# Patient Record
Sex: Female | Born: 1940
Health system: Southern US, Community
[De-identification: ages and names within clinical notes are randomized; demographics above are authoritative.]

## PROBLEM LIST (undated history)

## (undated) DIAGNOSIS — I509 Heart failure, unspecified: Secondary | ICD-10-CM

## (undated) DIAGNOSIS — I517 Cardiomegaly: Secondary | ICD-10-CM

## (undated) DIAGNOSIS — E785 Hyperlipidemia, unspecified: Secondary | ICD-10-CM

## (undated) DIAGNOSIS — D72821 Monocytosis (symptomatic): Secondary | ICD-10-CM

## (undated) DIAGNOSIS — R079 Chest pain, unspecified: Secondary | ICD-10-CM

## (undated) DIAGNOSIS — K219 Gastro-esophageal reflux disease without esophagitis: Secondary | ICD-10-CM

## (undated) DIAGNOSIS — I10 Essential (primary) hypertension: Secondary | ICD-10-CM

## (undated) DIAGNOSIS — E039 Hypothyroidism, unspecified: Secondary | ICD-10-CM

## (undated) DIAGNOSIS — I639 Cerebral infarction, unspecified: Secondary | ICD-10-CM

## (undated) HISTORY — DX: Monocytosis (symptomatic): D72.821

## (undated) HISTORY — PX: MASS EXCISION: SHX2000

## (undated) HISTORY — PX: ABDOMINAL HYSTERECTOMY: SHX81

---

## 2001-10-15 ENCOUNTER — Encounter: Admission: RE | Admit: 2001-10-15 | Discharge: 2001-10-15 | Payer: Self-pay | Admitting: Internal Medicine

## 2001-10-15 ENCOUNTER — Encounter: Payer: Self-pay | Admitting: Internal Medicine

## 2001-11-13 ENCOUNTER — Encounter: Admission: RE | Admit: 2001-11-13 | Discharge: 2001-11-13 | Payer: Self-pay | Admitting: Internal Medicine

## 2001-11-13 ENCOUNTER — Encounter: Payer: Self-pay | Admitting: Internal Medicine

## 2002-10-17 ENCOUNTER — Encounter: Admission: RE | Admit: 2002-10-17 | Discharge: 2002-10-17 | Payer: Self-pay | Admitting: Internal Medicine

## 2002-10-17 ENCOUNTER — Encounter: Payer: Self-pay | Admitting: Internal Medicine

## 2003-01-27 ENCOUNTER — Encounter: Payer: Self-pay | Admitting: Internal Medicine

## 2003-01-27 ENCOUNTER — Encounter: Admission: RE | Admit: 2003-01-27 | Discharge: 2003-01-27 | Payer: Self-pay | Admitting: Internal Medicine

## 2003-12-09 ENCOUNTER — Encounter: Admission: RE | Admit: 2003-12-09 | Discharge: 2003-12-09 | Payer: Self-pay | Admitting: Internal Medicine

## 2004-01-20 ENCOUNTER — Ambulatory Visit (HOSPITAL_COMMUNITY): Admission: RE | Admit: 2004-01-20 | Discharge: 2004-01-20 | Payer: Self-pay | Admitting: Internal Medicine

## 2004-05-16 ENCOUNTER — Encounter: Admission: RE | Admit: 2004-05-16 | Discharge: 2004-05-16 | Payer: Self-pay | Admitting: Internal Medicine

## 2004-06-29 ENCOUNTER — Encounter (INDEPENDENT_AMBULATORY_CARE_PROVIDER_SITE_OTHER): Payer: Self-pay | Admitting: Specialist

## 2004-06-29 ENCOUNTER — Ambulatory Visit (HOSPITAL_COMMUNITY): Admission: RE | Admit: 2004-06-29 | Discharge: 2004-06-29 | Payer: Self-pay | Admitting: Endocrinology

## 2004-12-05 ENCOUNTER — Ambulatory Visit (HOSPITAL_COMMUNITY): Admission: RE | Admit: 2004-12-05 | Discharge: 2004-12-05 | Payer: Self-pay | Admitting: Endocrinology

## 2004-12-12 ENCOUNTER — Encounter: Admission: RE | Admit: 2004-12-12 | Discharge: 2004-12-12 | Payer: Self-pay | Admitting: Internal Medicine

## 2005-02-07 ENCOUNTER — Ambulatory Visit (HOSPITAL_COMMUNITY): Admission: RE | Admit: 2005-02-07 | Discharge: 2005-02-07 | Payer: Self-pay | Admitting: Internal Medicine

## 2005-06-07 ENCOUNTER — Encounter: Admission: RE | Admit: 2005-06-07 | Discharge: 2005-07-06 | Payer: Self-pay | Admitting: Internal Medicine

## 2006-02-14 ENCOUNTER — Ambulatory Visit (HOSPITAL_COMMUNITY): Admission: RE | Admit: 2006-02-14 | Discharge: 2006-02-14 | Payer: Self-pay | Admitting: Internal Medicine

## 2006-04-11 ENCOUNTER — Encounter: Admission: RE | Admit: 2006-04-11 | Discharge: 2006-04-11 | Payer: Self-pay | Admitting: Internal Medicine

## 2007-02-18 ENCOUNTER — Encounter: Admission: RE | Admit: 2007-02-18 | Discharge: 2007-02-18 | Payer: Self-pay | Admitting: Orthopedic Surgery

## 2007-02-19 ENCOUNTER — Ambulatory Visit (HOSPITAL_COMMUNITY): Admission: RE | Admit: 2007-02-19 | Discharge: 2007-02-19 | Payer: Self-pay | Admitting: *Deleted

## 2007-05-02 ENCOUNTER — Encounter: Admission: RE | Admit: 2007-05-02 | Discharge: 2007-05-02 | Payer: Self-pay | Admitting: Endocrinology

## 2008-01-07 ENCOUNTER — Encounter: Admission: RE | Admit: 2008-01-07 | Discharge: 2008-01-07 | Payer: Self-pay | Admitting: Family Medicine

## 2008-02-21 ENCOUNTER — Ambulatory Visit (HOSPITAL_COMMUNITY): Admission: RE | Admit: 2008-02-21 | Discharge: 2008-02-21 | Payer: Self-pay | Admitting: Family Medicine

## 2009-01-04 ENCOUNTER — Encounter: Admission: RE | Admit: 2009-01-04 | Discharge: 2009-01-04 | Payer: Self-pay | Admitting: Family Medicine

## 2009-02-22 ENCOUNTER — Ambulatory Visit (HOSPITAL_COMMUNITY): Admission: RE | Admit: 2009-02-22 | Discharge: 2009-02-22 | Payer: Self-pay | Admitting: Family Medicine

## 2009-04-26 ENCOUNTER — Observation Stay (HOSPITAL_COMMUNITY): Admission: EM | Admit: 2009-04-26 | Discharge: 2009-04-27 | Payer: Self-pay | Admitting: Emergency Medicine

## 2009-04-27 ENCOUNTER — Encounter (INDEPENDENT_AMBULATORY_CARE_PROVIDER_SITE_OTHER): Payer: Self-pay | Admitting: Cardiovascular Disease

## 2010-02-23 ENCOUNTER — Ambulatory Visit (HOSPITAL_COMMUNITY): Admission: RE | Admit: 2010-02-23 | Discharge: 2010-02-23 | Payer: Self-pay | Admitting: *Deleted

## 2010-07-17 ENCOUNTER — Encounter: Payer: Self-pay | Admitting: Internal Medicine

## 2010-09-28 LAB — POCT I-STAT, CHEM 8
BUN: 11 mg/dL (ref 6–23)
Calcium, Ion: 1.26 mmol/L (ref 1.12–1.32)
Creatinine, Ser: 0.7 mg/dL (ref 0.4–1.2)
Glucose, Bld: 125 mg/dL — ABNORMAL HIGH (ref 70–99)
Hemoglobin: 16.3 g/dL — ABNORMAL HIGH (ref 12.0–15.0)
Sodium: 136 mEq/L (ref 135–145)

## 2010-09-28 LAB — CARDIAC PANEL(CRET KIN+CKTOT+MB+TROPI)
CK, MB: 1 ng/mL (ref 0.3–4.0)
Relative Index: INVALID (ref 0.0–2.5)
Total CK: 50 U/L (ref 7–177)
Troponin I: 0.03 ng/mL (ref 0.00–0.06)
Troponin I: 0.03 ng/mL (ref 0.00–0.06)

## 2010-09-28 LAB — CBC
HCT: 38.2 % (ref 36.0–46.0)
HCT: 44.4 % (ref 36.0–46.0)
MCHC: 32.8 g/dL (ref 30.0–36.0)
MCHC: 33 g/dL (ref 30.0–36.0)
MCV: 81.8 fL (ref 78.0–100.0)
MCV: 82 fL (ref 78.0–100.0)
Platelets: 239 10*3/uL (ref 150–400)
RDW: 14 % (ref 11.5–15.5)
WBC: 10.9 10*3/uL — ABNORMAL HIGH (ref 4.0–10.5)

## 2010-09-28 LAB — COMPREHENSIVE METABOLIC PANEL
ALT: <8 U/L (ref 0–35)
AST: 15 U/L (ref 0–37)
Albumin: 3.6 g/dL (ref 3.5–5.2)
Alkaline Phosphatase: 43 U/L (ref 39–117)
CO2: 27 mEq/L (ref 19–32)
Calcium: 9.5 mg/dL (ref 8.4–10.5)
Chloride: 104 mEq/L (ref 96–112)
GFR calc non Af Amer: >60 mL/min (ref >60)
Glucose, Bld: 114 mg/dL — ABNORMAL HIGH (ref 70–99)
Potassium: 3.8 mEq/L (ref 3.5–5.1)
Sodium: 137 mEq/L (ref 135–145)
Total Bilirubin: 0.7 mg/dL (ref 0.3–1.2)
Total Protein: 7.1 g/dL (ref 6.0–8.3)

## 2010-09-28 LAB — BASIC METABOLIC PANEL
BUN: 8 mg/dL (ref 6–23)
Calcium: 9.1 mg/dL (ref 8.4–10.5)
Chloride: 108 mEq/L (ref 96–112)
Creatinine, Ser: 0.58 mg/dL (ref 0.4–1.2)
GFR calc Af Amer: >60 mL/min (ref >60)
GFR calc non Af Amer: >60 mL/min (ref >60)

## 2010-09-28 LAB — TSH: TSH: 0.777 u[IU]/mL (ref 0.350–4.500)

## 2010-09-28 LAB — URINALYSIS, ROUTINE W REFLEX MICROSCOPIC
Glucose, UA: NEGATIVE mg/dL
Nitrite: NEGATIVE
Urobilinogen, UA: 0.2 mg/dL (ref 0.0–1.0)
pH: 7 (ref 5.0–8.0)

## 2010-09-28 LAB — POCT CARDIAC MARKERS: CKMB, poc: 1.5 ng/mL (ref 1.0–8.0)

## 2010-09-28 LAB — DIFFERENTIAL
Basophils Relative: 0 % (ref 0–1)
Eosinophils Absolute: 0.1 10*3/uL (ref 0.0–0.7)
Eosinophils Relative: 1 % (ref 0–5)
Monocytes Absolute: 1.9 10*3/uL — ABNORMAL HIGH (ref 0.1–1.0)
Monocytes Relative: 17 % — ABNORMAL HIGH (ref 3–12)
Neutro Abs: 7.5 10*3/uL (ref 1.7–7.7)

## 2010-09-28 LAB — CK TOTAL AND CKMB (NOT AT ARMC): Relative Index: INVALID (ref 0.0–2.5)

## 2010-09-28 LAB — LIPID PANEL
Cholesterol: 173 mg/dL (ref 0–200)
HDL: 78 mg/dL (ref >39)

## 2010-09-28 LAB — GLUCOSE, CAPILLARY: Glucose-Capillary: 84 mg/dL (ref 70–99)

## 2010-09-28 LAB — TROPONIN I: Troponin I: 0.01 ng/mL (ref 0.00–0.06)

## 2010-09-28 LAB — PROTIME-INR: Prothrombin Time: 14.7 seconds (ref 11.6–15.2)

## 2010-09-28 LAB — HEMOGLOBIN A1C: Hgb A1c MFr Bld: 5.8 % (ref 4.6–6.1)

## 2011-02-02 ENCOUNTER — Other Ambulatory Visit (HOSPITAL_COMMUNITY): Payer: Self-pay | Admitting: Family Medicine

## 2011-02-02 DIAGNOSIS — Z1231 Encounter for screening mammogram for malignant neoplasm of breast: Secondary | ICD-10-CM

## 2011-02-28 ENCOUNTER — Ambulatory Visit (HOSPITAL_COMMUNITY)
Admission: RE | Admit: 2011-02-28 | Discharge: 2011-02-28 | Disposition: A | Discharge status: Home / Self Care | Payer: Medicare Other | Source: Ambulatory Visit | Attending: Family Medicine | Admitting: Family Medicine

## 2011-02-28 DIAGNOSIS — Z1231 Encounter for screening mammogram for malignant neoplasm of breast: Secondary | ICD-10-CM | POA: Insufficient documentation

## 2011-07-24 DIAGNOSIS — H26499 Other secondary cataract, unspecified eye: Secondary | ICD-10-CM | POA: Diagnosis not present

## 2011-08-28 DIAGNOSIS — H26499 Other secondary cataract, unspecified eye: Secondary | ICD-10-CM | POA: Diagnosis not present

## 2011-10-16 ENCOUNTER — Other Ambulatory Visit: Payer: Self-pay | Admitting: Family Medicine

## 2011-10-16 ENCOUNTER — Ambulatory Visit
Admission: RE | Admit: 2011-10-16 | Discharge: 2011-10-16 | Disposition: A | Discharge status: Home / Self Care | Payer: Medicare Other | Source: Ambulatory Visit | Attending: Family Medicine | Admitting: Family Medicine

## 2011-10-16 DIAGNOSIS — R05 Cough: Secondary | ICD-10-CM

## 2011-10-16 DIAGNOSIS — E782 Mixed hyperlipidemia: Secondary | ICD-10-CM | POA: Diagnosis not present

## 2011-10-16 DIAGNOSIS — E049 Nontoxic goiter, unspecified: Secondary | ICD-10-CM | POA: Diagnosis not present

## 2011-10-16 DIAGNOSIS — E559 Vitamin D deficiency, unspecified: Secondary | ICD-10-CM | POA: Diagnosis not present

## 2011-10-16 DIAGNOSIS — R079 Chest pain, unspecified: Secondary | ICD-10-CM | POA: Diagnosis not present

## 2011-10-16 DIAGNOSIS — R42 Dizziness and giddiness: Secondary | ICD-10-CM | POA: Diagnosis not present

## 2011-10-16 DIAGNOSIS — K219 Gastro-esophageal reflux disease without esophagitis: Secondary | ICD-10-CM | POA: Diagnosis not present

## 2011-10-16 DIAGNOSIS — I517 Cardiomegaly: Secondary | ICD-10-CM | POA: Diagnosis not present

## 2011-10-16 DIAGNOSIS — M899 Disorder of bone, unspecified: Secondary | ICD-10-CM | POA: Diagnosis not present

## 2011-10-16 DIAGNOSIS — H6120 Impacted cerumen, unspecified ear: Secondary | ICD-10-CM | POA: Diagnosis not present

## 2011-10-16 DIAGNOSIS — M949 Disorder of cartilage, unspecified: Secondary | ICD-10-CM | POA: Diagnosis not present

## 2011-10-19 ENCOUNTER — Other Ambulatory Visit: Payer: Self-pay | Admitting: Family Medicine

## 2011-10-19 DIAGNOSIS — E049 Nontoxic goiter, unspecified: Secondary | ICD-10-CM

## 2011-10-23 ENCOUNTER — Ambulatory Visit
Admission: RE | Admit: 2011-10-23 | Discharge: 2011-10-23 | Disposition: A | Discharge status: Home / Self Care | Payer: Medicare Other | Source: Ambulatory Visit | Attending: Family Medicine | Admitting: Family Medicine

## 2011-10-23 DIAGNOSIS — E049 Nontoxic goiter, unspecified: Secondary | ICD-10-CM

## 2011-11-15 DIAGNOSIS — M899 Disorder of bone, unspecified: Secondary | ICD-10-CM | POA: Diagnosis not present

## 2011-11-15 DIAGNOSIS — M949 Disorder of cartilage, unspecified: Secondary | ICD-10-CM | POA: Diagnosis not present

## 2011-11-28 DIAGNOSIS — E049 Nontoxic goiter, unspecified: Secondary | ICD-10-CM | POA: Diagnosis not present

## 2012-01-31 ENCOUNTER — Other Ambulatory Visit (HOSPITAL_COMMUNITY): Payer: Self-pay | Admitting: Family Medicine

## 2012-01-31 DIAGNOSIS — Z1231 Encounter for screening mammogram for malignant neoplasm of breast: Secondary | ICD-10-CM

## 2012-02-29 ENCOUNTER — Ambulatory Visit (HOSPITAL_COMMUNITY): Payer: Medicare Other

## 2012-03-01 ENCOUNTER — Ambulatory Visit (HOSPITAL_COMMUNITY)
Admission: RE | Admit: 2012-03-01 | Discharge: 2012-03-01 | Disposition: A | Discharge status: Home / Self Care | Payer: Medicare Other | Source: Ambulatory Visit | Attending: Family Medicine | Admitting: Family Medicine

## 2012-03-01 DIAGNOSIS — Z1231 Encounter for screening mammogram for malignant neoplasm of breast: Secondary | ICD-10-CM | POA: Diagnosis not present

## 2012-03-08 DIAGNOSIS — E063 Autoimmune thyroiditis: Secondary | ICD-10-CM | POA: Diagnosis not present

## 2012-03-12 DIAGNOSIS — Z23 Encounter for immunization: Secondary | ICD-10-CM | POA: Diagnosis not present

## 2012-03-12 DIAGNOSIS — E049 Nontoxic goiter, unspecified: Secondary | ICD-10-CM | POA: Diagnosis not present

## 2012-04-16 DIAGNOSIS — M25569 Pain in unspecified knee: Secondary | ICD-10-CM | POA: Diagnosis not present

## 2012-04-16 DIAGNOSIS — Z79899 Other long term (current) drug therapy: Secondary | ICD-10-CM | POA: Diagnosis not present

## 2012-04-16 DIAGNOSIS — E559 Vitamin D deficiency, unspecified: Secondary | ICD-10-CM | POA: Diagnosis not present

## 2012-04-16 DIAGNOSIS — E782 Mixed hyperlipidemia: Secondary | ICD-10-CM | POA: Diagnosis not present

## 2012-04-16 DIAGNOSIS — I10 Essential (primary) hypertension: Secondary | ICD-10-CM | POA: Diagnosis not present

## 2012-09-18 DIAGNOSIS — H409 Unspecified glaucoma: Secondary | ICD-10-CM | POA: Diagnosis not present

## 2012-09-18 DIAGNOSIS — H4011X0 Primary open-angle glaucoma, stage unspecified: Secondary | ICD-10-CM | POA: Diagnosis not present

## 2012-10-14 DIAGNOSIS — K219 Gastro-esophageal reflux disease without esophagitis: Secondary | ICD-10-CM | POA: Diagnosis not present

## 2012-10-14 DIAGNOSIS — I10 Essential (primary) hypertension: Secondary | ICD-10-CM | POA: Diagnosis not present

## 2012-10-14 DIAGNOSIS — Z79899 Other long term (current) drug therapy: Secondary | ICD-10-CM | POA: Diagnosis not present

## 2012-10-14 DIAGNOSIS — E559 Vitamin D deficiency, unspecified: Secondary | ICD-10-CM | POA: Diagnosis not present

## 2012-10-14 DIAGNOSIS — E7800 Pure hypercholesterolemia, unspecified: Secondary | ICD-10-CM | POA: Diagnosis not present

## 2013-01-16 DIAGNOSIS — H409 Unspecified glaucoma: Secondary | ICD-10-CM | POA: Diagnosis not present

## 2013-01-16 DIAGNOSIS — H4011X0 Primary open-angle glaucoma, stage unspecified: Secondary | ICD-10-CM | POA: Diagnosis not present

## 2013-01-28 ENCOUNTER — Other Ambulatory Visit (HOSPITAL_COMMUNITY): Payer: Self-pay | Admitting: Family Medicine

## 2013-01-28 DIAGNOSIS — Z1231 Encounter for screening mammogram for malignant neoplasm of breast: Secondary | ICD-10-CM

## 2013-03-03 ENCOUNTER — Ambulatory Visit (HOSPITAL_COMMUNITY)
Admission: RE | Admit: 2013-03-03 | Discharge: 2013-03-03 | Disposition: A | Discharge status: Home / Self Care | Payer: Medicare Other | Source: Ambulatory Visit | Attending: Family Medicine | Admitting: Family Medicine

## 2013-03-03 DIAGNOSIS — Z1231 Encounter for screening mammogram for malignant neoplasm of breast: Secondary | ICD-10-CM | POA: Diagnosis not present

## 2013-03-17 ENCOUNTER — Other Ambulatory Visit: Payer: Medicare Other

## 2013-03-18 ENCOUNTER — Other Ambulatory Visit: Payer: Medicare Other

## 2013-03-21 ENCOUNTER — Ambulatory Visit: Payer: Medicare Other | Admitting: Endocrinology

## 2013-03-21 DIAGNOSIS — Z0289 Encounter for other administrative examinations: Secondary | ICD-10-CM

## 2013-04-16 DIAGNOSIS — E559 Vitamin D deficiency, unspecified: Secondary | ICD-10-CM | POA: Diagnosis not present

## 2013-04-16 DIAGNOSIS — R82998 Other abnormal findings in urine: Secondary | ICD-10-CM | POA: Diagnosis not present

## 2013-04-16 DIAGNOSIS — Z23 Encounter for immunization: Secondary | ICD-10-CM | POA: Diagnosis not present

## 2013-04-16 DIAGNOSIS — E049 Nontoxic goiter, unspecified: Secondary | ICD-10-CM | POA: Diagnosis not present

## 2013-04-16 DIAGNOSIS — R109 Unspecified abdominal pain: Secondary | ICD-10-CM | POA: Diagnosis not present

## 2013-04-16 DIAGNOSIS — E782 Mixed hyperlipidemia: Secondary | ICD-10-CM | POA: Diagnosis not present

## 2013-05-12 ENCOUNTER — Other Ambulatory Visit: Payer: Self-pay | Admitting: Gastroenterology

## 2013-05-12 DIAGNOSIS — R109 Unspecified abdominal pain: Secondary | ICD-10-CM | POA: Diagnosis not present

## 2013-05-12 DIAGNOSIS — R1031 Right lower quadrant pain: Secondary | ICD-10-CM | POA: Diagnosis not present

## 2013-05-26 DIAGNOSIS — H409 Unspecified glaucoma: Secondary | ICD-10-CM | POA: Diagnosis not present

## 2013-05-26 DIAGNOSIS — H4011X0 Primary open-angle glaucoma, stage unspecified: Secondary | ICD-10-CM | POA: Diagnosis not present

## 2013-06-03 ENCOUNTER — Other Ambulatory Visit: Payer: Self-pay | Admitting: Gastroenterology

## 2013-06-03 ENCOUNTER — Ambulatory Visit
Admission: RE | Admit: 2013-06-03 | Discharge: 2013-06-03 | Disposition: A | Discharge status: Home / Self Care | Payer: Medicare Other | Source: Ambulatory Visit | Attending: Gastroenterology | Admitting: Gastroenterology

## 2013-06-03 DIAGNOSIS — R109 Unspecified abdominal pain: Secondary | ICD-10-CM

## 2013-06-03 DIAGNOSIS — K222 Esophageal obstruction: Secondary | ICD-10-CM | POA: Diagnosis not present

## 2013-06-03 DIAGNOSIS — K219 Gastro-esophageal reflux disease without esophagitis: Secondary | ICD-10-CM | POA: Diagnosis not present

## 2013-08-28 DIAGNOSIS — H4011X0 Primary open-angle glaucoma, stage unspecified: Secondary | ICD-10-CM | POA: Diagnosis not present

## 2013-08-28 DIAGNOSIS — H409 Unspecified glaucoma: Secondary | ICD-10-CM | POA: Diagnosis not present

## 2013-09-10 ENCOUNTER — Encounter (HOSPITAL_COMMUNITY): Payer: Self-pay | Admitting: Emergency Medicine

## 2013-09-10 ENCOUNTER — Inpatient Hospital Stay (HOSPITAL_COMMUNITY)
Admission: EM | Admit: 2013-09-10 | Discharge: 2013-09-11 | DRG: 312 | Disposition: A | Discharge status: Home / Self Care | Payer: Medicare Other | Attending: Cardiology | Admitting: Cardiology

## 2013-09-10 ENCOUNTER — Emergency Department (HOSPITAL_COMMUNITY): Payer: Medicare Other

## 2013-09-10 DIAGNOSIS — E039 Hypothyroidism, unspecified: Secondary | ICD-10-CM | POA: Diagnosis present

## 2013-09-10 DIAGNOSIS — E785 Hyperlipidemia, unspecified: Secondary | ICD-10-CM | POA: Diagnosis present

## 2013-09-10 DIAGNOSIS — R404 Transient alteration of awareness: Secondary | ICD-10-CM | POA: Diagnosis not present

## 2013-09-10 DIAGNOSIS — I10 Essential (primary) hypertension: Secondary | ICD-10-CM | POA: Diagnosis present

## 2013-09-10 DIAGNOSIS — R55 Syncope and collapse: Principal | ICD-10-CM | POA: Diagnosis present

## 2013-09-10 DIAGNOSIS — R000 Tachycardia, unspecified: Secondary | ICD-10-CM

## 2013-09-10 DIAGNOSIS — I498 Other specified cardiac arrhythmias: Secondary | ICD-10-CM | POA: Diagnosis present

## 2013-09-10 DIAGNOSIS — Z7982 Long term (current) use of aspirin: Secondary | ICD-10-CM | POA: Diagnosis not present

## 2013-09-10 DIAGNOSIS — R42 Dizziness and giddiness: Secondary | ICD-10-CM | POA: Diagnosis not present

## 2013-09-10 DIAGNOSIS — I059 Rheumatic mitral valve disease, unspecified: Secondary | ICD-10-CM | POA: Diagnosis not present

## 2013-09-10 DIAGNOSIS — K219 Gastro-esophageal reflux disease without esophagitis: Secondary | ICD-10-CM | POA: Diagnosis present

## 2013-09-10 DIAGNOSIS — Z79899 Other long term (current) drug therapy: Secondary | ICD-10-CM

## 2013-09-10 DIAGNOSIS — I4892 Unspecified atrial flutter: Secondary | ICD-10-CM | POA: Diagnosis not present

## 2013-09-10 HISTORY — DX: Hypothyroidism, unspecified: E03.9

## 2013-09-10 HISTORY — DX: Essential (primary) hypertension: I10

## 2013-09-10 HISTORY — DX: Hyperlipidemia, unspecified: E78.5

## 2013-09-10 HISTORY — DX: Cardiomegaly: I51.7

## 2013-09-10 HISTORY — DX: Gastro-esophageal reflux disease without esophagitis: K21.9

## 2013-09-10 HISTORY — DX: Chest pain, unspecified: R07.9

## 2013-09-10 LAB — BASIC METABOLIC PANEL
BUN: 14 mg/dL (ref 6–23)
CHLORIDE: 104 meq/L (ref 96–112)
CO2: 23 mEq/L (ref 19–32)
Calcium: 10.2 mg/dL (ref 8.4–10.5)
Creatinine, Ser: 0.73 mg/dL (ref 0.50–1.10)
GFR, EST NON AFRICAN AMERICAN: 83 mL/min — AB (ref >90)
Glucose, Bld: 93 mg/dL (ref 70–99)
Potassium: 4.4 mEq/L (ref 3.7–5.3)
Sodium: 140 mEq/L (ref 137–147)

## 2013-09-10 LAB — CBC WITH DIFFERENTIAL/PLATELET
Basophils Absolute: 0.1 10*3/uL (ref 0.0–0.1)
Basophils Relative: 1 % (ref 0–1)
EOS ABS: 0.1 10*3/uL (ref 0.0–0.7)
Eosinophils Relative: 1 % (ref 0–5)
HCT: 44.7 % (ref 36.0–46.0)
Hemoglobin: 14.9 g/dL (ref 12.0–15.0)
LYMPHS ABS: 1.4 10*3/uL (ref 0.7–4.0)
Lymphocytes Relative: 16 % (ref 12–46)
MCH: 27 pg (ref 26.0–34.0)
MCHC: 33.3 g/dL (ref 30.0–36.0)
MCV: 81.1 fL (ref 78.0–100.0)
Monocytes Absolute: 1.1 10*3/uL — ABNORMAL HIGH (ref 0.1–1.0)
Monocytes Relative: 13 % — ABNORMAL HIGH (ref 3–12)
NEUTROS ABS: 5.7 10*3/uL (ref 1.7–7.7)
Neutrophils Relative %: 69 % (ref 43–77)
PLATELETS: 286 10*3/uL (ref 150–400)
RBC: 5.51 MIL/uL — AB (ref 3.87–5.11)
RDW: 14.1 % (ref 11.5–15.5)
WBC: 8.3 10*3/uL (ref 4.0–10.5)

## 2013-09-10 LAB — PROTIME-INR
INR: 1.08 (ref 0.00–1.49)
Prothrombin Time: 13.8 seconds (ref 11.6–15.2)

## 2013-09-10 LAB — MAGNESIUM: Magnesium: 1.9 mg/dL (ref 1.5–2.5)

## 2013-09-10 LAB — TROPONIN I: Troponin I: <0.3 ng/mL (ref <0.30)

## 2013-09-10 LAB — APTT: aPTT: 32 seconds (ref 24–37)

## 2013-09-10 MED ORDER — ENALAPRIL MALEATE 20 MG PO TABS
20.0000 mg | ORAL_TABLET | Freq: Every day | ORAL | Status: DC
  Administered 2013-09-10 – 2013-09-11 (×2): 20 mg via ORAL
  Filled 2013-09-10 (×2): qty 1

## 2013-09-10 MED ORDER — ENOXAPARIN SODIUM 40 MG/0.4ML ~~LOC~~ SOLN
40.0000 mg | SUBCUTANEOUS | Status: DC
  Administered 2013-09-10: 40 mg via SUBCUTANEOUS
  Filled 2013-09-10 (×2): qty 0.4

## 2013-09-10 MED ORDER — ACETAMINOPHEN 500 MG PO TABS: 1000.0000 mg | ORAL_TABLET | Freq: Two times a day (BID) | ORAL | Status: DC | PRN

## 2013-09-10 MED ORDER — METOPROLOL TARTRATE 12.5 MG HALF TABLET
12.5000 mg | ORAL_TABLET | Freq: Two times a day (BID) | ORAL | Status: DC
  Administered 2013-09-10 – 2013-09-11 (×2): 12.5 mg via ORAL
  Filled 2013-09-10 (×3): qty 1

## 2013-09-10 MED ORDER — SODIUM CHLORIDE 0.9 % IJ SOLN
3.0000 mL | Freq: Two times a day (BID) | INTRAMUSCULAR | Status: DC
  Administered 2013-09-10 – 2013-09-11 (×2): 3 mL via INTRAVENOUS

## 2013-09-10 MED ORDER — SIMVASTATIN 20 MG PO TABS
20.0000 mg | ORAL_TABLET | Freq: Every day | ORAL | Status: DC
  Administered 2013-09-10 – 2013-09-11 (×2): 20 mg via ORAL
  Filled 2013-09-10 (×2): qty 1

## 2013-09-10 MED ORDER — CHLORHEXIDINE GLUCONATE 0.12 % MT SOLN
15.0000 mL | Freq: Two times a day (BID) | OROMUCOSAL | Status: DC
  Administered 2013-09-10 – 2013-09-11 (×2): 15 mL via OROMUCOSAL
  Filled 2013-09-10 (×3): qty 15

## 2013-09-10 MED ORDER — HYDROCHLOROTHIAZIDE 12.5 MG PO CAPS
12.5000 mg | ORAL_CAPSULE | Freq: Every day | ORAL | Status: DC
  Administered 2013-09-11: 12.5 mg via ORAL
  Filled 2013-09-10 (×2): qty 1

## 2013-09-10 MED ORDER — BRINZOLAMIDE 1 % OP SUSP
1.0000 [drp] | Freq: Three times a day (TID) | OPHTHALMIC | Status: DC
Start: 2013-09-10 — End: 2013-09-11
  Administered 2013-09-10 – 2013-09-11 (×3): 1 [drp] via OPHTHALMIC
  Filled 2013-09-10: qty 10

## 2013-09-10 MED ORDER — NISOLDIPINE ER 25.5 MG PO TB24
25.5000 mg | ORAL_TABLET | Freq: Every day | ORAL | Status: DC
  Administered 2013-09-11: 25.5 mg via ORAL
  Filled 2013-09-10 (×2): qty 1

## 2013-09-10 MED ORDER — ASPIRIN EC 81 MG PO TBEC
81.0000 mg | DELAYED_RELEASE_TABLET | Freq: Every day | ORAL | Status: DC
  Administered 2013-09-10 – 2013-09-11 (×2): 81 mg via ORAL
  Filled 2013-09-10 (×2): qty 1

## 2013-09-10 MED ORDER — LEVOTHYROXINE SODIUM 50 MCG PO TABS
50.0000 ug | ORAL_TABLET | Freq: Every day | ORAL | Status: DC
  Administered 2013-09-11: 50 ug via ORAL
  Filled 2013-09-10 (×2): qty 1

## 2013-09-10 MED ORDER — PANTOPRAZOLE SODIUM 40 MG PO TBEC
40.0000 mg | DELAYED_RELEASE_TABLET | Freq: Every day | ORAL | Status: DC
  Administered 2013-09-10 – 2013-09-11 (×2): 40 mg via ORAL
  Filled 2013-09-10 (×2): qty 1

## 2013-09-10 NOTE — ED Notes (Signed)
Updated pt on wait. Pt ate and has no complaints at this time.

## 2013-09-10 NOTE — ED Notes (Signed)
Pt updated on wait and delay explained. Comfortable at this time

## 2013-09-10 NOTE — ED Notes (Signed)
Diet tray ordered 

## 2013-09-10 NOTE — H&P (Signed)
Physician History and Physical    Patient ID: Stephanie Simmons MRN: [id number] DOB/AGE: 06-26-1941 73 y.o. Admit date: 09/10/2013  Primary Care Physician: Leighton Ruff MD Primary Cardiologist Unknown  HPI: 73 yo BF with history of HTN presents for evaluation of syncope. She states that over the last several weeks she has experienced episodes of sudden lightheadedness. No prior syncope. No chest pain or SOB. She was followed by a cardiologist in [place] prior to moving here. Saw a Dr. Claiborne Billings a few years ago and had a normal cardiac cath.Today she was shopping at the grocery store. She thinks she was reaching up. The next thing she knew she was passed out on the floor. Quickly regained consciousness. No sequela. In the ED she was noted to have episodes of SVT with rates up to 140s. Now in NSR. No history of MI or CHF. Generally in good health but doesn't drive.  Review of systems complete and found to be negative unless listed above  Past Medical History  Diagnosis Date  . Hypertension   . Hypothyroidism   . GERD (gastroesophageal reflux disease)   . Hyperlipidemia   . Chest pain     a. 04/2009 Cath: essentially nl cors, EF 65%, mod LVH.  Marland Kitchen LVH (left ventricular hypertrophy)     a. 04/2009 Echo: EF 55-65%, mild conc LVH, no reg wma, Gr 1 DD, mild MR.    Family History  Problem Relation Age of Onset  . Other Mother     s/p PPM, died @ 77  . Heart attack Father     died @ 46.    History   Social History  . Marital Status: Single    Spouse Name: N/A    Number of Children: N/A  . Years of Education: N/A   Occupational History  . Not on file.   Social History Main Topics  . Smoking status: Never Smoker   . Smokeless tobacco: Not on file  . Alcohol Use: No  . Drug Use: No  . Sexual Activity: Not on file   Other Topics Concern  . Not on file   Social History Narrative   Lives in [place] by herself.    Past Surgical History  Procedure Laterality Date  . Abdominal hysterectomy         Physical Exam: Blood pressure 143/62, pulse 58, temperature 97.6 F (36.4 C), temperature source Oral, resp. rate 13, height 5\' 2"  (1.575 m), weight 142 lb (64.411 kg), SpO2 99.00%.  Very pleasant BF in NAD HEENT: [place]/AT, PERRLA, EOMI, oropharynx is clear.  Neck: no adenopathy, JVD or bruits, thyromegaly.  Lungs: clear CV: RRR, normal S1-2. Soft systolic murmur LSB. Abdomen: soft, NT, BS +, no HSM Ext: no edema. No cyanosis. Pulses 2+ Skin: warm and dry. Neuro: alert and oriented x 3. CN II-XII intact.   Labs:   Lab Results  Component Value Date   WBC 8.3 09/10/2013   HGB 14.9 09/10/2013   HCT 44.7 09/10/2013   MCV 81.1 09/10/2013   PLT 286 09/10/2013     Recent Labs Lab 09/10/13 1308  NA 140  K 4.4  CL 104  CO2 23  BUN 14  CREATININE 0.73  CALCIUM 10.2  GLUCOSE 93   Lab Results  Component Value Date   CKTOTAL 39 04/27/2009   CKMB 1.0 04/27/2009   TROPONINI <0.30 09/10/2013    Lab Results  Component Value Date   CHOL  Value: 173        ATP  III CLASSIFICATION:  <200     mg/dL   Desirable  200-239  mg/dL   Borderline High  >=240    mg/dL   High        04/27/2009   Lab Results  Component Value Date   HDL 78 04/27/2009   Lab Results  Component Value Date   LDLCALC  Value: 88        Total Cholesterol/HDL:CHD Risk Coronary Heart Disease Risk Table                     Men   Women  1/2 Average Risk   3.4   3.3  Average Risk       5.0   4.4  2 X Average Risk   9.6   7.1  3 X Average Risk  23.4   11.0        Use the calculated Patient Ratio above and the CHD Risk Table to determine the patient's CHD Risk.        ATP III CLASSIFICATION (LDL):  <100     mg/dL   Optimal  100-129  mg/dL   Near or Above                    Optimal  130-159  mg/dL   Borderline  160-189  mg/dL   High  >190     mg/dL   Very High 04/27/2009   Lab Results  Component Value Date   TRIG 35 04/27/2009   Lab Results  Component Value Date   CHOLHDL 2.2 04/27/2009   No results found for this basename:  LDLDIRECT      Radiology:PORTABLE CHEST - 1 VIEW  COMPARISON: DG CHEST 2 VIEW dated 10/16/2011; DG CHEST 1V PORT dated  04/26/2009; DG CHEST 2 VIEW dated 01/04/2009  FINDINGS:  Stable mild cardiac enlargement. Vascular pattern normal. Throughout  the lungs clear. Postsurgical change left apex stable.  IMPRESSION:  No active disease.  Electronically Signed  By: Skipper Cliche M.D.  On: 09/10/2013 13:44   EKG: NSR, limb lead reversal. LVH with repolarization abnormality.  Other Ecg: Atrial flutter with variable conduction.  ASSESSMENT AND PLAN:  1. Syncope due to arrhythmia. 2. Atrial flutter with RVR 3. HTN 4. Hypothyroidism on replacement.   Plan: Admit to telemetry. Check TSH, Echo. Check d-dimer. Start low dose metoprolol. Need to be careful since she may have tachy-brady syndrome. Will have EP to see. Will need to consider anticoagulation long term given CHADvasc score of 4. May be a candidate for one of the newer agents.   SignedCollier Salina [facility] 09/10/2013, 6:19 PM

## 2013-09-10 NOTE — ED Notes (Signed)
Patient transported by [facility] for complaint of near syncope.  Patient was shopping at [employer] and became dizzy.  EMS arrived to find patient in SVT at rate of 628 with a systolic blood pressure of 70.  Patient was loaded on ambulance but spontaneously converted on her own now maintaining a heart rate of 104 and a blood pressure of 124/77.

## 2013-09-10 NOTE — ED Provider Notes (Signed)
CSN: [id number]     Arrival date & time 09/10/13  1221 History   First MD Initiated Contact with Patient 09/10/13 1230     Chief Complaint  Patient presents with  . Loss of Consciousness     (Consider location/radiation/quality/duration/timing/severity/associated sxs/prior Treatment) Patient is a 73 y.o. female presenting with syncope.  Loss of Consciousness  Pt with no significant PMH reports she was in the grocery store just prior to arrival when she began to feel dizzy. A short time later she was reaching up to get something off the shelf when she had sudden loss of consciousness. She woke up on the floor with EMS standing over her. She is unsure how long she was out. She denies any CP, SOB, N/V or abdominal pain prior to passing out. She had a few dizzy spells similar to this two weeks ago btu did not pass out and did not seek medical care at that time. EMS reports her initial HR was in the 200s but converted to sinus tachy on moving to stretcher. She had another brief episode of tachycardia during transport caught on their monitor that appears to show SVT, narrow complex, rate 208. She is asymptomatic now.    Past Medical History  Diagnosis Date  . Hypertension   . Thyroid disease   . GERD (gastroesophageal reflux disease)    Past Surgical History  Procedure Laterality Date  . Abdominal hysterectomy     History reviewed. No pertinent family history. History  Substance Use Topics  . Smoking status: Never Smoker   . Smokeless tobacco: Not on file  . Alcohol Use: No   OB History   Grav Para Term Preterm Abortions TAB SAB Ect Mult Living                 Review of Systems  Cardiovascular: Positive for syncope.   All other systems reviewed and are negative except as noted in HPI.     Allergies  Review of patient's allergies indicates no known allergies.  Home Medications   Current Outpatient Rx  Name  Route  Sig  Dispense  Refill  . acetaminophen (TYLENOL) 500 MG  tablet   Oral   Take 1,000 mg by mouth 2 (two) times daily as needed (for body pain).         Marland Kitchen aspirin EC 81 MG tablet   Oral   Take 81 mg by mouth daily.         . brinzolamide (AZOPT) 1 % ophthalmic suspension   Both Eyes   Place 1 drop into both eyes 3 (three) times daily.         . chlorhexidine (PERIDEX) 0.12 % solution   Mouth/Throat   Use as directed 15 mLs in the mouth or throat 2 (two) times daily.         . Cholecalciferol (VITAMIN D-3 PO)   Oral   Take by mouth.         . enalapril (VASOTEC) 20 MG tablet   Oral   Take 20 mg by mouth daily.         Marland Kitchen esomeprazole (NEXIUM) 40 MG capsule   Oral   Take 40 mg by mouth daily at 12 noon.         . hydrochlorothiazide (MICROZIDE) 12.5 MG capsule   Oral   Take 12.5 mg by mouth daily.         Marland Kitchen levothyroxine (SYNTHROID, LEVOTHROID) 50 MCG tablet   Oral   Take  50 mcg by mouth daily before breakfast.         . nisoldipine (SULAR) 25.5 MG 24 hr tablet   Oral   Take 25.5 mg by mouth daily.         . simvastatin (ZOCOR) 20 MG tablet   Oral   Take 20 mg by mouth daily.          BP 126/73  Pulse 104  Temp(Src) 97.6 F (36.4 C) (Oral)  Resp 16  Ht 5\' 2"  (1.575 m)  Wt 142 lb (64.411 kg)  BMI 25.97 kg/m2  SpO2 100% Physical Exam  Nursing note and vitals reviewed. Constitutional: She is oriented to person, place, and time. She appears well-developed and well-nourished.  HENT:  Head: Normocephalic and atraumatic.  Eyes: EOM are normal. Pupils are equal, round, and reactive to light.  Neck: Normal range of motion. Neck supple.  Cardiovascular: Normal rate, normal heart sounds and intact distal pulses.   Pulmonary/Chest: Effort normal and breath sounds normal.  Abdominal: Bowel sounds are normal. She exhibits no distension. There is no tenderness.  Musculoskeletal: Normal range of motion. She exhibits no edema and no tenderness.  Neurological: She is alert and oriented to person, place, and  time. She has normal strength. No cranial nerve deficit or sensory deficit.  Skin: Skin is warm and dry. No rash noted.  Psychiatric: She has a normal mood and affect.    ED Course  Procedures (including critical care time) Labs Review Labs Reviewed  PROTIME-INR  APTT  CBC WITH DIFFERENTIAL  BASIC METABOLIC PANEL  TROPONIN I   Imaging Review Dg Chest Port 1 View  09/10/2013   CLINICAL DATA:  Syncope, hypertension  EXAM: PORTABLE CHEST - 1 VIEW  COMPARISON:  DG CHEST 2 VIEW dated 10/16/2011; DG CHEST 1V PORT dated 04/26/2009; DG CHEST 2 VIEW dated 01/04/2009  FINDINGS: Stable mild cardiac enlargement. Vascular pattern normal. Throughout the lungs clear. Postsurgical change left apex stable.  IMPRESSION: No active disease.   Electronically Signed   By: Skipper Cliche M.D.   On: 09/10/2013 13:44     EKG Interpretation   Date/Time:  Wednesday September 10 2013 12:25:31 EDT Ventricular Rate:  104 PR Interval:  183 QRS Duration: 102 QT Interval:  361 QTC Calculation: 475 R Axis:   93 Text Interpretation:  Right and left arm electrode reversal,  interpretation assumes no reversal Sinus tachycardia Right axis deviation  Borderline low voltage, extremity leads Anteroseptal infarct, old Repol  abnrm suggests ischemia, lateral leads Since last tracing ST-t wave  abnormality NOW PRESENT Confirmed by Karle Starch  MD, Juanda Crumble [phone]) on  09/10/2013 12:40:24 PM      MDM   Final diagnoses:  Syncope  Tachycardia    Pt with HR highly variable on monitor now from 60s to 140s. She is in sinus while in the lower range, but difficult to see P-waves on monitor while going faster. ?intermittent afib vs sick sinus. Will check labs and plan consult to cardiology.   2:34 PM Labs unremarkable. Cards to see patient in the ED.   Charles B. Karle Starch, MD 09/10/13 [phone]

## 2013-09-11 DIAGNOSIS — R55 Syncope and collapse: Secondary | ICD-10-CM | POA: Diagnosis not present

## 2013-09-11 DIAGNOSIS — I059 Rheumatic mitral valve disease, unspecified: Secondary | ICD-10-CM | POA: Diagnosis not present

## 2013-09-11 LAB — TSH: TSH: 1.255 u[IU]/mL (ref 0.350–4.500)

## 2013-09-11 LAB — TROPONIN I

## 2013-09-11 NOTE — Consult Note (Signed)
ELECTROPHYSIOLOGY CONSULT NOTE   Patient ID: Stephanie Simmons MRN: [id number], DOB/AGE: 74/30/1942   Admit date: 09/10/2013 Date of Consult: 09/11/2013  Primary Physician: Leighton Ruff, MD Primary EP: new to [facility] Reason for Consultation: Syncope and tachycardia  History of Present Illness Stephanie Simmons is a 73 y.o. female with history of normal coronary arteries s/p cardiac cath 2010, normal LVEF, HTN, dyslipidemia and hypothyroidism who presented yesterday with syncope and tachycardia. Ms. Bass reports she "just passed out" while walking through the grocery store yesterday. She had been feeling like her usual self up until that time. She had been walking around grocery store for about 5-10 minutes when she describes the sudden onset of dizziness. She tried to hold onto the shelf to see if her symptoms would resolve. She doesn't recall anything after that except hearing several people all around her frantic asking if she was okay. She regained consciousness quickly. She denies CP, SOB or palpitations. She denies nausea, vomiting or diaphoresis. No loss of bowel or bladder continence. She has been having intermittent dizziness, at least once daily, for the past 2 weeks. This is usually accompanied by weakness, lasting a few minutes to as much as 20 minutes then subsides on its own without intervention. All of her episodes have occurred while standing or walking and again she has no CP, SOB, palpitations, nausea or diaphoresis. She states she has been told in the past she had "a slow heartbeat and murmur." She denies any recent illness, fever, chills or surgery. She denies any recent changes to her medications.   Past Medical History Past Medical History  Diagnosis Date  . Hypertension   . Hypothyroidism   . GERD (gastroesophageal reflux disease)   . Hyperlipidemia   . Chest pain     a. 04/2009 Cath: essentially nl cors, EF 65%, mod LVH.  Marland Kitchen LVH (left ventricular  hypertrophy)     a. 04/2009 Echo: EF 55-65%, mild conc LVH, no reg wma, Gr 1 DD, mild MR.    Past Surgical History Past Surgical History  Procedure Laterality Date  . Abdominal hysterectomy    . Removal of mass- pleural      Allergies/Intolerances No Known Allergies  Current Home Medications      acetaminophen 500 MG tablet  Commonly known as:  TYLENOL  Take 1,000 mg by mouth 2 (two) times daily as needed (for body pain).     aspirin EC 81 MG tablet  Take 81 mg by mouth daily.     brinzolamide 1 % ophthalmic suspension  Commonly known as:  AZOPT  Place 1 drop into both eyes 3 (three) times daily.     chlorhexidine 0.12 % solution  Commonly known as:  PERIDEX  Use as directed 15 mLs in the mouth or throat 2 (two) times daily.     enalapril 20 MG tablet  Commonly known as:  VASOTEC  Take 20 mg by mouth daily.     esomeprazole 40 MG capsule  Commonly known as:  NEXIUM  Take 40 mg by mouth daily at 12 noon.     hydrochlorothiazide 12.5 MG capsule  Commonly known as:  MICROZIDE  Take 12.5 mg by mouth daily.     levothyroxine 50 MCG tablet  Commonly known as:  SYNTHROID, LEVOTHROID  Take 50 mcg by mouth daily before breakfast.     nisoldipine 25.5 MG 24 hr tablet  Commonly known as:  SULAR  Take 25.5 mg by mouth daily.  simvastatin 20 MG tablet  Commonly known as:  ZOCOR  Take 20 mg by mouth daily.     VITAMIN D-3 PO  Take by mouth.     Inpatient Medications . aspirin EC  81 mg Oral Daily  . brinzolamide  1 drop Both Eyes TID  . chlorhexidine  15 mL Mouth/Throat BID  . enalapril  20 mg Oral Daily  . enoxaparin (LOVENOX) injection  40 mg Subcutaneous Q24H  . hydrochlorothiazide  12.5 mg Oral Daily  . levothyroxine  50 mcg Oral QAC breakfast  . metoprolol tartrate  12.5 mg Oral BID  . nisoldipine  25.5 mg Oral Daily  . pantoprazole  40 mg Oral Daily  . simvastatin  20 mg Oral Daily  . sodium chloride  3 mL Intravenous Q12H    Family History Family  History  Problem Relation Age of Onset  . Other Mother     s/p PPM, died @ 13  . Heart attack Father     died @ 87.     Social History History   Social History  . Marital Status: Single    Spouse Name: N/A    Number of Children: N/A  . Years of Education: N/A   Occupational History  . Not on file.   Social History Main Topics  . Smoking status: Never Smoker   . Smokeless tobacco: Not on file  . Alcohol Use: No  . Drug Use: No  . Sexual Activity: Not on file   Other Topics Concern  . Not on file   Social History Narrative   Lives in [place] by herself.     Review of Systems General: No chills, fever, night sweats or weight changes  Cardiovascular:  No chest pain, dyspnea on exertion, edema, orthopnea, palpitations, paroxysmal nocturnal dyspnea Dermatological: No rash, lesions or masses Respiratory: No cough, dyspnea Urologic: No hematuria, dysuria Abdominal: No nausea, vomiting, diarrhea, bright red blood per rectum, melena, or hematemesis Neurologic: No visual changes, weakness, changes in mental status All other systems reviewed and are otherwise negative except as noted above.  Physical Exam Vitals: Blood pressure 183/76, pulse 62, temperature 98.8 F (37.1 C), temperature source Oral, resp. rate 20, height 5\' 2"  (1.575 m), weight 138 lb 7.2 oz (62.8 kg), SpO2 97.00%.  General: Well developed, well appearing 73 y.o. female in no acute distress. HEENT: Normocephalic, atraumatic. EOMs intact. Sclera nonicteric. Oropharynx clear.  Neck: Supple without bruits. No JVD. Lungs: Respirations regular and unlabored, CTA bilaterally. No wheezes, rales or rhonchi. Heart: RRR. S1, S2 present. No murmurs, rub, S3 or S4. Abdomen: Soft, non-tender, non-distended. BS present x 4 quadrants. No hepatosplenomegaly.  Extremities: No clubbing, cyanosis or edema. DP/PT/Radials 2+ and equal bilaterally. Psych: Normal affect. Neuro: Alert and oriented X 3. Moves all extremities  spontaneously. Musculoskeletal: No kyphosis. Skin: Intact. Warm and dry. No rashes or petechiae in exposed areas.   Labs  Recent Labs  09/10/13 1308 09/10/13 2310  TROPONINI <0.30 <0.30   Lab Results  Component Value Date   WBC 8.3 09/10/2013   HGB 14.9 09/10/2013   HCT 44.7 09/10/2013   MCV 81.1 09/10/2013   PLT 286 09/10/2013    Recent Labs Lab 09/10/13 1308  NA 140  K 4.4  CL 104  CO2 23  BUN 14  CREATININE 0.73  CALCIUM 10.2  GLUCOSE 93    Recent Labs  09/10/13 1308  INR 1.08    Radiology/Studies Dg Chest Port 1 View  09/10/2013   CLINICAL  DATA:  Syncope, hypertension  EXAM: PORTABLE CHEST - 1 VIEW  COMPARISON:  DG CHEST 2 VIEW dated 10/16/2011; DG CHEST 1V PORT dated 04/26/2009; DG CHEST 2 VIEW dated 01/04/2009  FINDINGS: Stable mild cardiac enlargement. Vascular pattern normal. Throughout the lungs clear. Postsurgical change left apex stable.  IMPRESSION: No active disease.   Electronically Signed   By: Skipper Cliche M.D.   On: 09/10/2013 13:44   Cardiac catheterization 2010 HEMODYNAMIC DATA:   Central aortic pressure was 165/80.   Left ventricular pressure was 165/50. ANGIOGRAPHIC DATA:   Left main coronary was a short normal vessel, which immediately trifurcated into an LAD, a ramus intermediate vessel, and left circumflex system. LAD had some mild tortuosity and appeared to have a small segment, which may have dipped intramyocardially.  There did not appear to be any significant focal fixed defects.  The LAD did wrap around the LV apex. Ramus intermediate vessel was angiographically normal. Circumflex vessel was angiographically normal, gave rise to 2 marginal vessels. Right coronary artery was tortuous normal vessel that gave rise to a PDA and small posterolateral system. Fluoroscopy demonstrated a significant mitral annular calcification.  Biplane cine left ventriculography revealed hyperdynamic LV function with an ejection fraction of greater than 65%.   There was at least moderate LVH.  The patient did have significant mitral annular calcification with mild MR.  Some of the MR was ectopy mediated.  Distal aortography did not demonstrate any renal artery stenosis.  There was very minimal aneurysmal dilatation in the distal aorta proximal to the iliac bifurcation. IMPRESSION: 1. Moderate left ventricular hypertrophy with hyperdynamic left ventricular function, with evidence for significant mitral annular calcification and 1+ mitral regurgitation. 2. Essentially normal coronary arteries without fixed obstructive disease. 3. Minimal aneurysmal dilatation of the distal infrarenal aorta proximal to the iliac bifurcation, not felt to be significant. RECOMMENDATIONS:  Medical therapy.  The patient will also need a 2-D echo Doppler study to further evaluate her mitral valve and quantify diastolic function in this patient with hyperdynamic LV function but at least moderate LVH and ECG changes can suggesting repolarization abnormalities.  Echocardiogram  Pending  12-lead ECG on presentation - atrial tachycardia at 104 bpm; atrial rate >200 Repeat ECG - NSR at 59 bpm Telemetry reviewed - SR  Assessment and Plan 1. Syncope 2. Atrial tachycardia, asymptomatic 3. HTN 4. Hypothroidism 5. History of normal coronary arteries s/p cardiac cath 2010 Dizziness and syncope sounds most consistent with vasovagal etiology. But could be bradcardic mediated Will check orthostatics.  Unclear if related to atrial tachycardia as she has no symptom of palpitations, CP or SOB. Given that her atrial rate >200 may need to consider anticoagulation but concerned about falling and syncope and subsequent injury with anticoagulation.  As echo is normal, ok for discharge today and obtain outpatient 3 week monitor and followup with me.   Signed, Ileene Hutchinson, PA-C 09/11/2013, 12:10 PM  EP Attending  Patient seen and examined. Agree with above history, physical exam,  assessment and plan as written and amended by me.   Mikle Bosworth.D.

## 2013-09-11 NOTE — Progress Notes (Signed)
Discharge instructions given and reviewed with patient and family member. Patient to pick up monitor from dr office on Monday.

## 2013-09-11 NOTE — Progress Notes (Signed)
Echocardiogram 2D Echocardiogram has been performed.  Joelene Millin 09/11/2013, 11:06 AM

## 2013-09-11 NOTE — Discharge Summary (Signed)
ELECTROPHYSIOLOGY DISCHARGE SUMMARY   Patient ID: Stephanie Simmons,  MRN: [id number], DOB/AGE: 12/16/1940 73 y.o.  Admit date: 09/10/2013 Discharge date: 09/11/2013  Primary Care Physician: Leighton Ruff, MD Primary Cardiologist: new to [facility], [place]  Primary Discharge Diagnosis:  1. Syncope - history most consistent with vasovagal etiology 2. Atrial tachycardia - asymptomatic  Secondary Discharge Diagnoses:  1. HTN 2. Hypothroidism 3. HTN 4. History of normal coronary arteries s/p cardiac cath 2010  Procedures This Admission:  1. 2D echocardiogram Study Conclusions - Left ventricle: The cavity size was normal. There was mild concentric hypertrophy. Systolic function was normal. The estimated ejection fraction was in the range of 60% to 65%. Wall motion was normal; there were no regional wall motion abnormalities. There was a reduced contribution of atrial contraction to ventricular filling, due to increased ventricular diastolic pressure or atrial contractile dysfunction. Doppler parameters are consistent with a reversible restrictive pattern, indicative of decreased left ventricular diastolic compliance and/or increased left atrial pressure (grade 3 diastolic dysfunction). Doppler parameters are consistent with elevated ventricular end-diastolic filling pressure. - Aortic valve: Mildly calcified annulus. Trileaflet; mildly thickened, mildly calcified leaflets. Trivial regurgitation. - Mitral valve: Mild regurgitation. - Left atrium: The atrium was moderately dilated. - Pericardium, extracardiac: A trivial pericardial effusion was identified posterior to the heart.  History and Hospital Course:  Stephanie Simmons is a 73 y.o. female with history of normal coronary arteries s/p cardiac cath 2010, normal LVEF, HTN, dyslipidemia and hypothyroidism who presented yesterday with syncope and tachycardia. Stephanie Simmons reports she "just passed out" while walking  through the grocery store yesterday. She had been feeling like her usual self up until that time. She had been walking around grocery store for about 5-10 minutes when she describes the sudden onset of dizziness. She tried to hold onto the shelf to see if her symptoms would resolve. She doesn't recall anything after that except hearing several people all around her frantic asking if she was okay. She regained consciousness quickly. She denies CP, SOB or palpitations. She denies nausea, vomiting or diaphoresis. No loss of bowel or bladder continence. She has been having intermittent dizziness, at least once daily, for the past 2 weeks. This is usually accompanied by weakness, lasting a few minutes to as much as 20 minutes then subsides on its own without intervention. All of her episodes have occurred while standing or walking and again she has no CP, SOB, palpitations, nausea or diaphoresis. She states she has been told in the past she had "a slow heartbeat and murmur." She denies any recent illness, fever, chills or surgery. She denies any recent changes to her medications. On admission she was tachycardic and her ECG shows an atrial tachycardia at 104 bpm. She converted to SR without intervention shortly after admission. An echo was done revealing normal LVEF, grade 3 diastolic dysfunction and no significant valvular abnormalities. Labs were unremarkable. Potassium 4.4 and magnesium 1.9. Troponin negative. TSH 1.255. She was seen in consultation by Dr. Cristopher Peru and deemed stable for discharge today. She was instructed not to drive until given clearance by Dr. [name]. She will wear a 3-week monitor and follow-up with Dr. [name] in 4 weeks.  Discharge Vitals: Blood pressure 140/66, pulse 73, temperature 98.8 F (37.1 C), temperature source Oral, resp. rate 19, height 5\' 2"  (1.575 m), weight 138 lb 7.2 oz (62.8 kg), SpO2 100.00%.   Labs: Lab Results  Component Value Date   WBC 8.3 09/10/2013   HGB 14.9  09/10/2013   HCT 44.7 09/10/2013   MCV 81.1 09/10/2013   PLT 286 09/10/2013    Recent Labs Lab 09/10/13 1308  NA 140  K 4.4  CL 104  CO2 23  BUN 14  CREATININE 0.73  CALCIUM 10.2  GLUCOSE 93   Lab Results  Component Value Date   CKTOTAL 39 04/27/2009   CKMB 1.0 04/27/2009   TROPONINI <0.30 09/10/2013     Recent Labs  09/10/13 1308  INR 1.08    Disposition:  The patient is being discharged in stable condition.  Follow-up: Follow-up Information   Follow up with [facility] [place] On 09/15/2013. (At 11:00 AM to pick up heart monitor)    Specialty:  Cardiology   Contact information:   [address] [place] [address] [phone]      Follow up with Cristopher Peru, MD On 10/09/2013. (At 10:15 AM)    Specialty:  Cardiology   Contact information:   [address] [address] [phone]      Discharge Medications:    Medication List         acetaminophen 500 MG tablet  Commonly known as:  TYLENOL  Take 1,000 mg by mouth 2 (two) times daily as needed (for body pain).     aspirin EC 81 MG tablet  Take 81 mg by mouth daily.     brinzolamide 1 % ophthalmic suspension  Commonly known as:  AZOPT  Place 1 drop into both eyes 3 (three) times daily.     chlorhexidine 0.12 % solution  Commonly known as:  PERIDEX  Use as directed 15 mLs in the mouth or throat 2 (two) times daily.     enalapril 20 MG tablet  Commonly known as:  VASOTEC  Take 20 mg by mouth daily.     esomeprazole 40 MG capsule  Commonly known as:  NEXIUM  Take 40 mg by mouth daily at 12 noon.     hydrochlorothiazide 12.5 MG capsule  Commonly known as:  MICROZIDE  Take 12.5 mg by mouth daily.     levothyroxine 50 MCG tablet  Commonly known as:  SYNTHROID, LEVOTHROID  Take 50 mcg by mouth daily before breakfast.     nisoldipine 25.5 MG 24 hr tablet  Commonly known as:  SULAR  Take 25.5 mg by mouth daily.     simvastatin 20 MG tablet   Commonly known as:  ZOCOR  Take 20 mg by mouth daily.     VITAMIN D-3 PO  Take by mouth.       Duration of Discharge Encounter: Greater than 30 minutes including physician time.  Signed, Ileene Hutchinson, PA-C 09/11/2013, 5:48 PM  EP Attending  Patient seen and examined. Agree with above history, physical exam, assessment and plan.  Mikle Bosworth.D.

## 2013-09-11 NOTE — Discharge Instructions (Signed)
NO DRIVING until given clearance by Dr. [name].

## 2013-09-16 ENCOUNTER — Encounter (INDEPENDENT_AMBULATORY_CARE_PROVIDER_SITE_OTHER): Payer: Medicare Other

## 2013-09-16 ENCOUNTER — Encounter: Payer: Self-pay | Admitting: *Deleted

## 2013-09-16 ENCOUNTER — Other Ambulatory Visit: Payer: Self-pay | Admitting: *Deleted

## 2013-09-16 DIAGNOSIS — R55 Syncope and collapse: Secondary | ICD-10-CM

## 2013-09-16 DIAGNOSIS — I4892 Unspecified atrial flutter: Secondary | ICD-10-CM | POA: Diagnosis not present

## 2013-09-16 NOTE — Progress Notes (Signed)
Patient ID: Stephanie Simmons, female   DOB: [birth year], 73 y.o.   MRN: [id number] E-Cardio verite 30 day cardiac event applied to patient.

## 2013-09-16 NOTE — Progress Notes (Signed)
Order in epic for cardiac event monitor under Dr Merlyn Lot, patient has ov with him 10/09/13

## 2013-09-25 ENCOUNTER — Telehealth: Payer: Self-pay | Admitting: *Deleted

## 2013-09-25 ENCOUNTER — Emergency Department (HOSPITAL_COMMUNITY): Payer: Medicare Other

## 2013-09-25 ENCOUNTER — Inpatient Hospital Stay (HOSPITAL_COMMUNITY)
Admission: EM | Admit: 2013-09-25 | Discharge: 2013-09-27 | DRG: 310 | Disposition: A | Discharge status: Home / Self Care | Payer: Medicare Other | Attending: Internal Medicine | Admitting: Internal Medicine

## 2013-09-25 ENCOUNTER — Encounter (HOSPITAL_COMMUNITY): Payer: Self-pay | Admitting: Emergency Medicine

## 2013-09-25 DIAGNOSIS — I4892 Unspecified atrial flutter: Secondary | ICD-10-CM | POA: Diagnosis present

## 2013-09-25 DIAGNOSIS — R55 Syncope and collapse: Secondary | ICD-10-CM | POA: Diagnosis not present

## 2013-09-25 DIAGNOSIS — I10 Essential (primary) hypertension: Secondary | ICD-10-CM | POA: Diagnosis present

## 2013-09-25 DIAGNOSIS — K219 Gastro-esophageal reflux disease without esophagitis: Secondary | ICD-10-CM | POA: Diagnosis present

## 2013-09-25 DIAGNOSIS — E039 Hypothyroidism, unspecified: Secondary | ICD-10-CM | POA: Diagnosis present

## 2013-09-25 DIAGNOSIS — Z79899 Other long term (current) drug therapy: Secondary | ICD-10-CM | POA: Diagnosis not present

## 2013-09-25 DIAGNOSIS — I471 Supraventricular tachycardia, unspecified: Secondary | ICD-10-CM | POA: Diagnosis not present

## 2013-09-25 DIAGNOSIS — I498 Other specified cardiac arrhythmias: Secondary | ICD-10-CM | POA: Diagnosis not present

## 2013-09-25 DIAGNOSIS — Z7982 Long term (current) use of aspirin: Secondary | ICD-10-CM

## 2013-09-25 DIAGNOSIS — E785 Hyperlipidemia, unspecified: Secondary | ICD-10-CM | POA: Diagnosis present

## 2013-09-25 DIAGNOSIS — R000 Tachycardia, unspecified: Secondary | ICD-10-CM | POA: Diagnosis not present

## 2013-09-25 DIAGNOSIS — I4891 Unspecified atrial fibrillation: Secondary | ICD-10-CM | POA: Diagnosis present

## 2013-09-25 DIAGNOSIS — R079 Chest pain, unspecified: Secondary | ICD-10-CM | POA: Diagnosis not present

## 2013-09-25 LAB — TROPONIN I

## 2013-09-25 LAB — CBC
HCT: 46.7 % — ABNORMAL HIGH (ref 36.0–46.0)
HEMOGLOBIN: 15.6 g/dL — AB (ref 12.0–15.0)
MCH: 26.9 pg (ref 26.0–34.0)
MCHC: 33.4 g/dL (ref 30.0–36.0)
MCV: 80.4 fL (ref 78.0–100.0)
Platelets: 289 10*3/uL (ref 150–400)
RBC: 5.81 MIL/uL — AB (ref 3.87–5.11)
RDW: 14.6 % (ref 11.5–15.5)
WBC: 10.6 10*3/uL — AB (ref 4.0–10.5)

## 2013-09-25 LAB — BASIC METABOLIC PANEL
BUN: 11 mg/dL (ref 6–23)
CHLORIDE: 97 meq/L (ref 96–112)
CO2: 24 meq/L (ref 19–32)
Calcium: 9.5 mg/dL (ref 8.4–10.5)
Creatinine, Ser: 0.63 mg/dL (ref 0.50–1.10)
GFR calc Af Amer: >90 mL/min (ref >90)
GFR calc non Af Amer: 87 mL/min — ABNORMAL LOW (ref >90)
GLUCOSE: 109 mg/dL — AB (ref 70–99)
Potassium: 6.2 mEq/L — ABNORMAL HIGH (ref 3.7–5.3)
SODIUM: 133 meq/L — AB (ref 137–147)

## 2013-09-25 LAB — MAGNESIUM: MAGNESIUM: 1.9 mg/dL (ref 1.5–2.5)

## 2013-09-25 LAB — PRO B NATRIURETIC PEPTIDE: Pro B Natriuretic peptide (BNP): 3444 pg/mL — ABNORMAL HIGH (ref 0–125)

## 2013-09-25 LAB — TSH: TSH: 1.73 u[IU]/mL (ref 0.350–4.500)

## 2013-09-25 LAB — I-STAT TROPONIN, ED: TROPONIN I, POC: 0.12 ng/mL — AB (ref 0.00–0.08)

## 2013-09-25 MED ORDER — DILTIAZEM HCL 100 MG IV SOLR: 5.0000 mg/h | INTRAVENOUS | Status: DC

## 2013-09-25 MED ORDER — SIMVASTATIN 20 MG PO TABS
20.0000 mg | ORAL_TABLET | Freq: Every day | ORAL | Status: DC
  Administered 2013-09-25: 20 mg via ORAL
  Filled 2013-09-25 (×2): qty 1

## 2013-09-25 MED ORDER — DILTIAZEM HCL 100 MG IV SOLR
5.0000 mg/h | Freq: Once | INTRAVENOUS | Status: AC
  Administered 2013-09-25: 5 mg/h via INTRAVENOUS

## 2013-09-25 MED ORDER — BRINZOLAMIDE 1 % OP SUSP
1.0000 [drp] | Freq: Three times a day (TID) | OPHTHALMIC | Status: DC
  Administered 2013-09-25 – 2013-09-27 (×5): 1 [drp] via OPHTHALMIC
  Filled 2013-09-25: qty 10

## 2013-09-25 MED ORDER — ASPIRIN EC 81 MG PO TBEC
81.0000 mg | DELAYED_RELEASE_TABLET | Freq: Every day | ORAL | Status: DC
  Administered 2013-09-25 – 2013-09-27 (×3): 81 mg via ORAL
  Filled 2013-09-25 (×3): qty 1

## 2013-09-25 MED ORDER — ADENOSINE 6 MG/2ML IV SOLN
INTRAVENOUS | Status: AC
  Filled 2013-09-25: qty 2

## 2013-09-25 MED ORDER — CHLORHEXIDINE GLUCONATE 0.12 % MT SOLN
15.0000 mL | Freq: Two times a day (BID) | OROMUCOSAL | Status: DC
  Administered 2013-09-26: 15 mL via OROMUCOSAL
  Filled 2013-09-25 (×5): qty 15

## 2013-09-25 MED ORDER — ADENOSINE 6 MG/2ML IV SOLN
12.0000 mg | INTRAVENOUS | Status: AC
  Administered 2013-09-25: 12 mg via INTRAVENOUS

## 2013-09-25 MED ORDER — SODIUM CHLORIDE 0.9 % IJ SOLN: 3.0000 mL | INTRAMUSCULAR | Status: DC | PRN

## 2013-09-25 MED ORDER — ENOXAPARIN SODIUM 40 MG/0.4ML ~~LOC~~ SOLN
40.0000 mg | SUBCUTANEOUS | Status: DC
  Administered 2013-09-25 – 2013-09-26 (×2): 40 mg via SUBCUTANEOUS
  Filled 2013-09-25 (×3): qty 0.4

## 2013-09-25 MED ORDER — HYDROCHLOROTHIAZIDE 12.5 MG PO CAPS: 12.5000 mg | ORAL_CAPSULE | Freq: Every day | ORAL | Status: DC

## 2013-09-25 MED ORDER — SODIUM CHLORIDE 0.9 % IJ SOLN
3.0000 mL | Freq: Two times a day (BID) | INTRAMUSCULAR | Status: DC
  Administered 2013-09-26 (×2): 3 mL via INTRAVENOUS

## 2013-09-25 MED ORDER — HYDROCHLOROTHIAZIDE 12.5 MG PO CAPS
12.5000 mg | ORAL_CAPSULE | Freq: Every day | ORAL | Status: DC
  Administered 2013-09-26 – 2013-09-27 (×2): 12.5 mg via ORAL
  Filled 2013-09-25 (×2): qty 1

## 2013-09-25 MED ORDER — SODIUM CHLORIDE 0.9 % IV SOLN: 250.0000 mL | INTRAVENOUS | Status: DC | PRN

## 2013-09-25 MED ORDER — DILTIAZEM HCL 30 MG PO TABS
30.0000 mg | ORAL_TABLET | Freq: Four times a day (QID) | ORAL | Status: DC
  Administered 2013-09-26 – 2013-09-27 (×6): 30 mg via ORAL
  Filled 2013-09-25 (×10): qty 1

## 2013-09-25 MED ORDER — ADENOSINE 6 MG/2ML IV SOLN
12.0000 mg | Freq: Once | INTRAVENOUS | Status: AC
  Administered 2013-09-25: 12 mg via INTRAVENOUS

## 2013-09-25 MED ORDER — LEVOTHYROXINE SODIUM 50 MCG PO TABS
50.0000 ug | ORAL_TABLET | Freq: Every day | ORAL | Status: DC
  Administered 2013-09-26 – 2013-09-27 (×2): 50 ug via ORAL
  Filled 2013-09-25 (×3): qty 1

## 2013-09-25 MED ORDER — PANTOPRAZOLE SODIUM 40 MG PO TBEC
40.0000 mg | DELAYED_RELEASE_TABLET | Freq: Every day | ORAL | Status: DC
  Administered 2013-09-26 – 2013-09-27 (×2): 40 mg via ORAL
  Filled 2013-09-25: qty 1

## 2013-09-25 MED ORDER — ENALAPRIL MALEATE 20 MG PO TABS
20.0000 mg | ORAL_TABLET | Freq: Every day | ORAL | Status: DC
  Administered 2013-09-26 – 2013-09-27 (×2): 20 mg via ORAL
  Filled 2013-09-25 (×3): qty 1

## 2013-09-25 NOTE — Telephone Encounter (Signed)
Called patient again and was able to speak to her.  She is not feeling well.  Complains of "being dizzy"  She had a difficult time understanding me over the phone.  I have instructed her to go to the ER as per Dr Tanna Furry recommendation.  She is going to call her daughter and have her take her.  Stephanie Simmons, [place] aware and i have faxed over monitor strips

## 2013-09-25 NOTE — Telephone Encounter (Signed)
Message left for the patient to call in regards to previous faxes documented by [place]. Third fax just received showing a-fib with rates of 190 bpm around 8:21 am (CT). Dr. [name] has reviewed strips and confirms a-fib vs 2:1 flutter. Per Dr. [name] if symptomatic, she should report to the ER.

## 2013-09-25 NOTE — H&P (Signed)
Stephanie Simmons is a 73 y.o. female  Admit Date: 09/25/2013 Referring Physician: Leighton Ruff, M.D. Primary Cardiologist: Cristopher Peru, M.D. Chief complaint / reason for admission: Near syncope and SVT  HPI: 73 year old female recently discharged after a syncopal episode. While hospitalized noted to have supraventricular tachycardia spoken of in the records as either atrial flutter or SVT. The patient has been wearing a 30 day monitor. Earlier this morning she began feeling weak and dizzy. She called the monitor company and was told to call her doctor's office. Rhythm strips were obtained that documented a tachycardia which I have not been able to obtain or see. There is some question as to whether or not atrial flutter was present. She was instructed to come to the emergency room. With some delay, for unknown reasons, she appeared in the emergency room at about 3:30 PM. Her heart at that time was 190 beats per minute, narrow complex, with blood pressure in the 100/60 mmHg range. She was given IV adenosine which did not braided tachycardia. She was then given 5 mg of intravenous diltiazem which resulted in reversion to normal sinus rhythm. During the tachycardia she complained of dizziness and intermittent chest pain. Postconversion EKG demonstrated persistent anterolateral ST segment.    PMH:    Past Medical History  Diagnosis Date  . Hypertension   . Hypothyroidism   . GERD (gastroesophageal reflux disease)   . Hyperlipidemia   . Chest pain     a. 04/2009 Cath: essentially nl cors, EF 65%, mod LVH.  Marland Kitchen LVH (left ventricular hypertrophy)     a. 04/2009 Echo: EF 55-65%, mild conc LVH, no reg wma, Gr 1 DD, mild MR.    PSH:    Past Surgical History  Procedure Laterality Date  . Abdominal hysterectomy    . Removal of mass- pleural     ALLERGIES:   Review of patient's allergies indicates no known allergies. Prior to Admit Meds:     Medication List    ASK your doctor about  these medications       acetaminophen 500 MG tablet  Commonly known as:  TYLENOL  Take 1,000 mg by mouth 2 (two) times daily as needed (for body pain).     aspirin EC 81 MG tablet  Take 81 mg by mouth daily.     brinzolamide 1 % ophthalmic suspension  Commonly known as:  AZOPT  Place 1 drop into both eyes 3 (three) times daily.     chlorhexidine 0.12 % solution  Commonly known as:  PERIDEX  Use as directed 15 mLs in the mouth or throat 2 (two) times daily.     enalapril 20 MG tablet  Commonly known as:  VASOTEC  Take 20 mg by mouth daily.     esomeprazole 40 MG capsule  Commonly known as:  NEXIUM  Take 40 mg by mouth daily at 12 noon.     hydrochlorothiazide 12.5 MG capsule  Commonly known as:  MICROZIDE  Take 12.5 mg by mouth daily.     levothyroxine 50 MCG tablet  Commonly known as:  SYNTHROID, LEVOTHROID  Take 50 mcg by mouth daily before breakfast.     nisoldipine 25.5 MG 24 hr tablet  Commonly known as:  SULAR  Take 25.5 mg by mouth daily.     simvastatin 20 MG tablet  Commonly known as:  ZOCOR  Take 20 mg by mouth daily.     VITAMIN D-3 PO  Take by mouth.  Family HX:    Family History  Problem Relation Age of Onset  . Other Mother     s/p PPM, died @ 76  . Heart attack Father     died @ 4.   Social HX:    History   Social History  . Marital Status: Single    Spouse Name: N/A    Number of Children: N/A  . Years of Education: N/A   Occupational History  . Not on file.   Social History Main Topics  . Smoking status: Never Smoker   . Smokeless tobacco: Not on file  . Alcohol Use: No  . Drug Use: No  . Sexual Activity: Not on file   Other Topics Concern  . Not on file   Social History Narrative   Lives in [place] by herself.     ROS: Recent history of syncope. She denies palpitations. No neurological complaints other than episodes of dizziness she denies orthopnea. There is no peripheral edema. She denies any over-the-counter  medication use. She's been able to sleep without difficulty. No blood in urine, stool, and denies difficulty with swallowing and hematemesis.  Physical Exam: Blood pressure 110/74, pulse 80, temperature 98.3 F (36.8 C), temperature source Oral, resp. rate 18, SpO2 100.00%.   African American female, appearing her stated age, lying flat on the hospital gurney in no distress. Skin is warm and dry. HEENT exam reveals no pallor or evidence of jaundice Neck exam reveals no JVD or caroti4. Gastroesophageal reflux bruits Chest clear to auscultation and percussion Cardiac exam reveals no murmur, rub, click, or murmur The abdomen is soft. No tenderness. Bowel sounds are normal Extremities reveal no edema. Pulses are 2+ and symmetric in upper and lower extremities Neurological exam reveals no motor sensory deficits.  Labs: Lab Results  Component Value Date   WBC 10.6* 09/25/2013   HGB 15.6* 09/25/2013   HCT 46.7* 09/25/2013   MCV 80.4 09/25/2013   PLT 289 09/25/2013    Recent Labs Lab 09/25/13 1617  NA 133*  K 6.2*  CL 97  CO2 24  BUN 11  CREATININE 0.63  CALCIUM 9.5  GLUCOSE 109*   Lab Results  Component Value Date   CKTOTAL 39 04/27/2009   CKMB 1.0 04/27/2009   TROPONINI <0.30 09/25/2013     Radiology:  Chest x-ray:  IMPRESSION:  No acute abnormality noted  EKG:   Narrow complex tachycardia at a rate of 190 beats per minute compatible with AV nodal reentrant tachycardia  ASSESSMENT: 1. Supraventricular tachycardia, associated with dizziness and near syncope. Resolved after IV diltiazem.  2. Recent history of syncope  3. History of hypertension   Plan:  1. We will admit to telemetry 2. Start oral diltiazem 3. Electrophysiology to see and consider further management 4. DVT prophylaxis with heparin 5. Cycle enzymes to rule out MI Sinclair Grooms 09/25/2013 6:39 PM

## 2013-09-25 NOTE — ED Notes (Signed)
Attempted to call report

## 2013-09-25 NOTE — ED Notes (Signed)
Pt reports was seen here recently for rapid HR and syncopal episode, was dc home with heart monitor. Called them today due to not feeling well, having chest pain, dizziness and palpitations. Heart monitor showed rapid afib rate of 200 and pt was sent here. EKg done at triage, HR 190.

## 2013-09-25 NOTE — Telephone Encounter (Signed)
Received call and fax from [facility]. They report the pt had atrial fib this morning with rate 210 and 180. Left message for pt to call

## 2013-09-25 NOTE — ED Provider Notes (Signed)
CSN: [id number]     Arrival date & time 09/25/13  1534 History   First MD Initiated Contact with Patient 09/25/13 1556     Chief Complaint  Patient presents with  . Tachycardia  . Chest Pain     (Consider location/radiation/quality/duration/timing/severity/associated sxs/prior Treatment) HPI Comments: As to the ER for evaluation of elevated heart rate. Patient reports that she was called by the heart rate monitoring company today and told to go to the emergency room because her heart rate was fast. She does report that she has been having intermittent pains in the left side of her chest as well as shortness of breath and dizziness.   Patient is a 74 y.o. female presenting with chest pain.  Chest Pain Associated symptoms: dizziness, shortness of breath and weakness     Past Medical History  Diagnosis Date  . Hypertension   . Hypothyroidism   . GERD (gastroesophageal reflux disease)   . Hyperlipidemia   . Chest pain     a. 04/2009 Cath: essentially nl cors, EF 65%, mod LVH.  Marland Kitchen LVH (left ventricular hypertrophy)     a. 04/2009 Echo: EF 55-65%, mild conc LVH, no reg wma, Gr 1 DD, mild MR.   Past Surgical History  Procedure Laterality Date  . Abdominal hysterectomy    . Removal of mass- pleural     Family History  Problem Relation Age of Onset  . Other Mother     s/p PPM, died @ 57  . Heart attack Father     died @ 39.   History  Substance Use Topics  . Smoking status: Never Smoker   . Smokeless tobacco: Not on file  . Alcohol Use: No   OB History   Grav Para Term Preterm Abortions TAB SAB Ect Mult Living                 Review of Systems  Respiratory: Positive for shortness of breath.   Cardiovascular: Positive for chest pain.  Neurological: Positive for dizziness and weakness.  All other systems reviewed and are negative.      Allergies  Review of patient's allergies indicates no known allergies.  Home Medications   Current Outpatient Rx  Name  Route   Sig  Dispense  Refill  . acetaminophen (TYLENOL) 500 MG tablet   Oral   Take 1,000 mg by mouth 2 (two) times daily as needed (for body pain).         Marland Kitchen aspirin EC 81 MG tablet   Oral   Take 81 mg by mouth daily.         . brinzolamide (AZOPT) 1 % ophthalmic suspension   Both Eyes   Place 1 drop into both eyes 3 (three) times daily.         . chlorhexidine (PERIDEX) 0.12 % solution   Mouth/Throat   Use as directed 15 mLs in the mouth or throat 2 (two) times daily.         . Cholecalciferol (VITAMIN D-3 PO)   Oral   Take by mouth.         . enalapril (VASOTEC) 20 MG tablet   Oral   Take 20 mg by mouth daily.         Marland Kitchen esomeprazole (NEXIUM) 40 MG capsule   Oral   Take 40 mg by mouth daily at 12 noon.         . hydrochlorothiazide (MICROZIDE) 12.5 MG capsule   Oral   Take  12.5 mg by mouth daily.         Marland Kitchen levothyroxine (SYNTHROID, LEVOTHROID) 50 MCG tablet   Oral   Take 50 mcg by mouth daily before breakfast.         . nisoldipine (SULAR) 25.5 MG 24 hr tablet   Oral   Take 25.5 mg by mouth daily.         . simvastatin (ZOCOR) 20 MG tablet   Oral   Take 20 mg by mouth daily.          BP 110/74  Pulse 80  Temp(Src) 98.3 F (36.8 C) (Oral)  Resp 18  SpO2 100% Physical Exam  Constitutional: She is oriented to person, place, and time. She appears well-developed and well-nourished. No distress.  HENT:  Head: Normocephalic and atraumatic.  Right Ear: Hearing normal.  Left Ear: Hearing normal.  Nose: Nose normal.  Mouth/Throat: Oropharynx is clear and moist and mucous membranes are normal.  Eyes: Conjunctivae and EOM are normal. Pupils are equal, round, and reactive to light.  Neck: Normal range of motion. Neck supple.  Cardiovascular: Regular rhythm, S1 normal and S2 normal.  Tachycardia present.  Exam reveals no gallop and no friction rub.   No murmur heard. Pulmonary/Chest: Effort normal and breath sounds normal. No respiratory distress.  She exhibits no tenderness.  Abdominal: Soft. Normal appearance and bowel sounds are normal. There is no hepatosplenomegaly. There is no tenderness. There is no rebound, no guarding, no tenderness at McBurney's point and negative Murphy's sign. No hernia.  Musculoskeletal: Normal range of motion.  Neurological: She is alert and oriented to person, place, and time. She has normal strength. No cranial nerve deficit or sensory deficit. Coordination normal. GCS eye subscore is 4. GCS verbal subscore is 5. GCS motor subscore is 6.  Skin: Skin is warm, dry and intact. No rash noted. No cyanosis.  Psychiatric: She has a normal mood and affect. Her speech is normal and behavior is normal. Thought content normal.    ED Course  Procedures (including critical care time)  Chemical cardioversion: Patient was placed on continuous cardiac monitoring. He was on oxygen. She was placed on the Zoll device. IV access had already been established. Patient was administered adenosine 6 mg IV push. She did have a brief period where she her heart rate slowed, but unfortunately both monitors stickers off on her arm is moved and rhythm cannot be identified. Adenosine 6 mg IV push was repeated without any result. The patient was therefore administered adenosine 12 mg IV push, did have a period of bradycardia down into the 30s lasting only seconds. Underlying rhythm was sinus rhythm, the patient immediately went back into SVT. Patient tolerated the procedure. No complications other than persistent SVT despite administration of the medications.  Labs Review Labs Reviewed  CBC - Abnormal; Notable for the following:    WBC 10.6 (*)    RBC 5.81 (*)    Hemoglobin 15.6 (*)    HCT 46.7 (*)    All other components within normal limits  BASIC METABOLIC PANEL - Abnormal; Notable for the following:    Sodium 133 (*)    Potassium 6.2 (*)    Glucose, Bld 109 (*)    GFR calc non Af Amer 87 (*)    All other components within normal  limits  PRO B NATRIURETIC PEPTIDE - Abnormal; Notable for the following:    Pro B Natriuretic peptide (BNP) 3444.0 (*)    All other components within normal  limits  I-STAT TROPOININ, ED - Abnormal; Notable for the following:    Troponin i, poc 0.12 (*)    All other components within normal limits  TROPONIN I   Imaging Review Dg Chest Portable 1 View  09/25/2013   CLINICAL DATA:  Tachycardia and chest pain  EXAM: PORTABLE CHEST - 1 VIEW  COMPARISON:  09/10/2013  FINDINGS: Monitoring devices are noted overlying the chest. The cardiac shadow is mildly enlarged but stable. Postsurgical changes in the left apex are seen. No definitive infiltrate is noted. No acute bony abnormality is seen.  IMPRESSION: No acute abnormality noted.   Electronically Signed   By: Inez Catalina M.D.   On: 09/25/2013 16:36     EKG Interpretation   Date/Time:  Thursday September 25 2013 15:42:40 EDT Ventricular Rate:  190 PR Interval:    QRS Duration: 84 QT Interval:  244 QTC Calculation: 433 R Axis:   74 Text Interpretation:  Supraventricular tachycardia marked st abn, likely  rate related Abnormal ECG SVT new since prior tracing Confirmed by POLLINA   MD, CHRISTOPHER [phone]) on 09/25/2013 3:56:10 PM     Repeat EKG Interpretation   Date/Time:  Thursday September 25 2013 16:19:19 EDT Ventricular Rate:  89 PR Interval:   154 QRS Duration: 98 QT Interval:  335 QTC Calculation: 408 R Axis:   102 Text Interpretation:  NSR, RAD, anteroseptal infarct (old), inf-lateral ST depressions, poss ischemia  MDM   Final diagnoses:  SVT (supraventricular tachycardia)   Patient presents to the ER for evaluation of rapid heart rate. She was mildly symptomatic with intermittent chest pain, dizziness associated with symptoms. Reviewing her records reveals that she was recently here with syncope and had 2 episodes of SVT prior to hospitalization. She was discharged with a heart monitor and the monitor identified tachycardia today.  Upon arrival to the ER she has a heart of 190 beats per minute, appears to be a supraventricular tachycardia.  Her vital signs are otherwise stable, she has a blood pressure above 952 systolic. Oxygen saturation is normal. She appears comfortable, in no distress currently. It was therefore felt that the patient could tolerate chemical cardioversion. Patient was given adenosine with only brief slowing of the heart rate which appeared to show an underlying sinus rhythm. It was therefore decided to initiate the patient on Cardizem. After a 5 mg Cardizem bolus, patient's SVT did break into a sinus rhythm. Repeat EKG shows a rate of 89 beats per minute. There is persistent inferior and lateral ST depressions. [facility] consult cardiology for admission and further management.  Orpah Greek, MD 09/25/13 1740

## 2013-09-26 ENCOUNTER — Encounter (HOSPITAL_COMMUNITY)
Admission: EM | Disposition: A | Discharge status: Home / Self Care | Payer: Self-pay | Source: Home / Self Care | Attending: Internal Medicine

## 2013-09-26 DIAGNOSIS — I498 Other specified cardiac arrhythmias: Secondary | ICD-10-CM

## 2013-09-26 LAB — BASIC METABOLIC PANEL
BUN: 12 mg/dL (ref 6–23)
CALCIUM: 9.2 mg/dL (ref 8.4–10.5)
CO2: 23 mEq/L (ref 19–32)
Chloride: 104 mEq/L (ref 96–112)
Creatinine, Ser: 0.65 mg/dL (ref 0.50–1.10)
GFR calc Af Amer: >90 mL/min (ref >90)
GFR, EST NON AFRICAN AMERICAN: 86 mL/min — AB (ref >90)
Glucose, Bld: 101 mg/dL — ABNORMAL HIGH (ref 70–99)
POTASSIUM: 3.5 meq/L — AB (ref 3.7–5.3)
SODIUM: 139 meq/L (ref 137–147)

## 2013-09-26 LAB — HEMOGLOBIN A1C
Hgb A1c MFr Bld: 6.2 % — ABNORMAL HIGH (ref <5.7)
MEAN PLASMA GLUCOSE: 131 mg/dL — AB (ref <117)

## 2013-09-26 LAB — T4, FREE: Free T4: 1.09 ng/dL (ref 0.80–1.80)

## 2013-09-26 SURGERY — SUPRAVENTRICULAR TACHYCARDIA ABLATION
Anesthesia: LOCAL

## 2013-09-26 MED ORDER — FLECAINIDE ACETATE 50 MG PO TABS
75.0000 mg | ORAL_TABLET | Freq: Two times a day (BID) | ORAL | Status: DC
  Administered 2013-09-26 – 2013-09-27 (×3): 75 mg via ORAL
  Filled 2013-09-26 (×4): qty 2

## 2013-09-26 MED ORDER — ATORVASTATIN CALCIUM 10 MG PO TABS
10.0000 mg | ORAL_TABLET | Freq: Every day | ORAL | Status: DC
  Administered 2013-09-26: 10 mg via ORAL
  Filled 2013-09-26 (×2): qty 1

## 2013-09-26 NOTE — Care Management Note (Unsigned)
    Page 1 of 1   09/26/2013     10:52:46 AM   CARE MANAGEMENT NOTE 09/26/2013  Patient:  Stephanie Simmons,Stephanie Simmons   Account Number:  [id number]  Date Initiated:  09/26/2013  Documentation initiated by:  GRAVES-BIGELOW,Shirley Bolle  Subjective/Objective Assessment:   Pt admitted for Near syncope and SVT. EP is consulting with pt.     Action/Plan:   CM will continue to monitor for disposition needs.   Anticipated DC Date:  09/28/2013   Anticipated DC Plan:  [place]  CM consult      Choice offered to / List presented to:             Status of service:  In process, will continue to follow Medicare Important Message given?   (If response is "NO", the following Medicare IM given date fields will be blank) Date Medicare IM given:   Date Additional Medicare IM given:    Discharge Disposition:    Per UR Regulation:  Reviewed for med. necessity/level of care/duration of stay  If discussed at [place] of Stay Meetings, dates discussed:    Comments:

## 2013-09-26 NOTE — Progress Notes (Signed)
ELECTROPHYSIOLOGY CONSULT NOTE    Patient ID: Stephanie Simmons MRN: [id number], DOB/AGE: 10-10-1940 73 y.o.  Admit date: 09/25/2013 Date of Consult: 09-26-13  Primary Physician: Gerrit Heck, MD Electrophysiologist: [name]  Reason for Consultation: SVT  HPI:  Stephanie Simmons is a 73 y.o. female with a history of hypertension, dyslipidemia, and hypothyroidism.  She was evaluated in the hospital 08-2013 for syncope and at that time, it was identified that she had SVT.  It was felt this was relatively asymptomatic and not necessarily the cause for her syncope and she was discharged to home with a 30 day monitor.  On the day of admission, she had SVT at a rate of 200bpm with associated symptoms of dizziness and pre-syncope.  She was advised to come to the hospital for further evaluation.  She was given adenosine with slowing of the tachycardia; diltiazem drip terminated the tachycardia. Her symptoms are much improved with restoration of sinus rhythm.    Echocardiogram 09-11-13 demonstrated EF 60-65%, no RWMA, grade 3 diastolic dysfunction, mild MR, LA 48.  Lab work this admission is grossly unremarkable.   EP has been asked to evaluate for treatment options.  ROS is negative except as outlined above.   Past Medical History  Diagnosis Date  . Hypertension   . Hypothyroidism   . GERD (gastroesophageal reflux disease)   . Hyperlipidemia   . Chest pain     a. 04/2009 Cath: essentially nl cors, EF 65%, mod LVH.  Marland Kitchen LVH (left ventricular hypertrophy)     a. 04/2009 Echo: EF 55-65%, mild conc LVH, no reg wma, Gr 1 DD, mild MR.     Surgical History:  Past Surgical History  Procedure Laterality Date  . Abdominal hysterectomy    . Removal of mass- pleural       Prescriptions prior to admission  Medication Sig Dispense Refill  . acetaminophen (TYLENOL) 500 MG tablet Take 1,000 mg by mouth 2 (two) times daily as needed (for body pain).      Marland Kitchen aspirin EC 81 MG tablet Take 81 mg by  mouth daily.      . brinzolamide (AZOPT) 1 % ophthalmic suspension Place 1 drop into both eyes 3 (three) times daily.      . chlorhexidine (PERIDEX) 0.12 % solution Use as directed 15 mLs in the mouth or throat 2 (two) times daily.      . Cholecalciferol (VITAMIN D-3 PO) Take by mouth.      . enalapril (VASOTEC) 20 MG tablet Take 20 mg by mouth daily.      Marland Kitchen esomeprazole (NEXIUM) 40 MG capsule Take 40 mg by mouth daily at 12 noon.      . hydrochlorothiazide (MICROZIDE) 12.5 MG capsule Take 12.5 mg by mouth daily.      Marland Kitchen levothyroxine (SYNTHROID, LEVOTHROID) 50 MCG tablet Take 50 mcg by mouth daily before breakfast.      . nisoldipine (SULAR) 25.5 MG 24 hr tablet Take 25.5 mg by mouth daily.      . simvastatin (ZOCOR) 20 MG tablet Take 20 mg by mouth daily.        Inpatient Medications:  . aspirin EC  81 mg Oral Daily  . atorvastatin  10 mg Oral q1800  . brinzolamide  1 drop Both Eyes TID  . chlorhexidine  15 mL Mouth/Throat BID  . diltiazem  30 mg Oral 4 times per day  . enalapril  20 mg Oral Daily  . enoxaparin (LOVENOX) injection  40 mg Subcutaneous Q24H  .  hydrochlorothiazide  12.5 mg Oral Daily  . levothyroxine  50 mcg Oral QAC breakfast  . pantoprazole  40 mg Oral Daily  . sodium chloride  3 mL Intravenous Q12H    Allergies: No Known Allergies  History   Social History  . Marital Status: Single    Spouse Name: N/A    Number of Children: N/A  . Years of Education: N/A   Occupational History  . Not on file.   Social History Main Topics  . Smoking status: Never Smoker   . Smokeless tobacco: Not on file  . Alcohol Use: No  . Drug Use: No  . Sexual Activity: Not on file   Other Topics Concern  . Not on file   Social History Narrative   Lives in [place] by herself.     Family History  Problem Relation Age of Onset  . Other Mother     s/p PPM, died @ 7  . Heart attack Father     died @ 64.     Physical Exam  frail appearing 73 yo woman, NAD HEENT:  Unremarkable,[place], AT Neck:  6 JVD, no thyromegally Back:  No CVA tenderness Lungs:  Clear with no wheezes, rales, or rhonchi HEART:  IRegular rate rhythm, no murmurs, no rubs, no clicks Abd:  soft, positive bowel sounds, no organomegally, no rebound, no guarding Ext:  2 plus pulses, no edema, no cyanosis, no clubbing Skin:  No rashes no nodules Neuro:  CN II through XII intact, motor grossly intact   Labs:   Lab Results  Component Value Date   WBC 10.6* 09/25/2013   HGB 15.6* 09/25/2013   HCT 46.7* 09/25/2013   MCV 80.4 09/25/2013   PLT 289 09/25/2013    Recent Labs Lab 09/26/13 0235  NA 139  K 3.5*  CL 104  CO2 23  BUN 12  CREATININE 0.65  CALCIUM 9.2  GLUCOSE 101*     Radiology/Studies: Dg Chest Portable 1 View 09/25/2013   CLINICAL DATA:  Tachycardia and chest pain  EXAM: PORTABLE CHEST - 1 VIEW  COMPARISON:  09/10/2013  FINDINGS: Monitoring devices are noted overlying the chest. The cardiac shadow is mildly enlarged but stable. Postsurgical changes in the left apex are seen. No definitive infiltrate is noted. No acute bony abnormality is seen.  IMPRESSION: No acute abnormality noted.   Electronically Signed   By: Inez Catalina M.D.   On: 09/25/2013 16:36   Dg Chest Port 1 View 09/10/2013   CLINICAL DATA:  Syncope, hypertension  EXAM: PORTABLE CHEST - 1 VIEW  COMPARISON:  DG CHEST 2 VIEW dated 10/16/2011; DG CHEST 1V PORT dated 04/26/2009; DG CHEST 2 VIEW dated 01/04/2009  FINDINGS: Stable mild cardiac enlargement. Vascular pattern normal. Throughout the lungs clear. Postsurgical change left apex stable.  IMPRESSION: No active disease.   Electronically Signed   By: Skipper Cliche M.D.   On: 09/10/2013 13:44    EKG: 09-25-13 long RP tachycardia, rate 190  TELEMETRY: sinus rhythm with frequent PAC's  A/P  1. Recurrent atrial arrhythmias 2. Syncope 3. HTN Rec: I have discussed the treatment options and reviewed her many ECG's. She has atrial tachycardia with 1:1 Av conduction but also  controlled AV conduction. She was symptomatic when her heart rate was 210/min. I discussed catheter ablation with the patient but think that a trial of medical therapy might be best initially. Will start flecainide 75 mg twice daily and watch overnight with plans to discharge in the morning.  Mikle Bosworth.D.

## 2013-09-27 ENCOUNTER — Other Ambulatory Visit: Payer: Self-pay | Admitting: Physician Assistant

## 2013-09-27 DIAGNOSIS — I498 Other specified cardiac arrhythmias: Secondary | ICD-10-CM | POA: Diagnosis not present

## 2013-09-27 MED ORDER — DILTIAZEM HCL ER COATED BEADS 180 MG PO CP24: 180.0000 mg | ORAL_CAPSULE | Freq: Every day | ORAL | Status: DC

## 2013-09-27 MED ORDER — APIXABAN 5 MG PO TABS: 5.0000 mg | ORAL_TABLET | Freq: Two times a day (BID) | ORAL | Status: DC

## 2013-09-27 MED ORDER — DILTIAZEM HCL ER COATED BEADS 180 MG PO CP24
180.0000 mg | ORAL_CAPSULE | Freq: Every day | ORAL | Status: DC
  Filled 2013-09-27: qty 1

## 2013-09-27 MED ORDER — ATORVASTATIN CALCIUM 10 MG PO TABS: 10.0000 mg | ORAL_TABLET | Freq: Every evening | ORAL | Status: DC

## 2013-09-27 MED ORDER — APIXABAN 5 MG PO TABS
5.0000 mg | ORAL_TABLET | Freq: Two times a day (BID) | ORAL | Status: DC
  Filled 2013-09-27 (×2): qty 1

## 2013-09-27 MED ORDER — FLECAINIDE ACETATE 150 MG PO TABS: 75.0000 mg | ORAL_TABLET | Freq: Two times a day (BID) | ORAL | Status: DC

## 2013-09-27 NOTE — Discharge Summary (Signed)
ELECTROPHYSIOLOGY PROCEDURE DISCHARGE SUMMARY    Patient ID: Stephanie Simmons,  MRN: [id number], DOB/AGE: [birth year] 73 y.o.  Admit date: 09/25/2013 Discharge date: 09/27/2013  Primary Care Physician: Gerrit Heck, MD Primary Cardiologist: [name]  Primary Discharge Diagnosis:  1.  Atrial flutter and atrial fibrillation with a rapid ventricular rate associated with sob and near syncope  Secondary Discharge Diagnosis:  1.  Hypertension 2.  Hypothyroidism 3.  Hyperlipidemia 4.  Normal coronaries by cath 04-2009  No Known Allergies  Brief HPI/Hospital Course:  Stephanie Simmons is a 73 y.o. female with a history of hypertension, dyslipidemia, and hypothyroidism. She was evaluated in the hospital 08-2013 for syncope and at that time, it was identified that she had SVT. It was felt this was relatively asymptomatic and not necessarily the cause for her syncope and she was discharged to home with a 30 day monitor. On the day of admission, she had SVT (likely 1:1 atrial tachycardia) at a rate of 200bpm with associated symptoms of dizziness and pre-syncope. She was advised to come to the hospital for further evaluation. She was given adenosine with slowing of the tachycardia transiently; diltiazem drip converted her to a rate controlled atrial fibrillation. Lab work is notable for mildly elevated troponin at point of care, but follow up troponin was negative.   She was initiated on Flecainide with conversion to SR.  She was examined by Dr [name] and considered stable for discharge to home.  CHADS2VASC score is 4 and so she will be initiated on Eliquis for anticoagulation.  She will have follow up GXT in 1 week and follow up with Dr [name] as scheduled in 3 weeks.    Discharge Vitals: Blood pressure 115/67, pulse 54, temperature 98.3 F (36.8 C), temperature source Oral, resp. rate 17, weight 138 lb 14.4 oz (63.005 kg), SpO2 95.00%.   Labs:   Lab Results  Component Value Date   WBC 10.6* 09/25/2013   HGB 15.6* 09/25/2013   HCT 46.7* 09/25/2013   MCV 80.4 09/25/2013   PLT 289 09/25/2013    Recent Labs Lab 09/26/13 0235  NA 139  K 3.5*  CL 104  CO2 23  BUN 12  CREATININE 0.65  CALCIUM 9.2  GLUCOSE 101*     Discharge Medications:    Medication List    ASK your doctor about these medications       acetaminophen 500 MG tablet  Commonly known as:  TYLENOL  Take 1,000 mg by mouth 2 (two) times daily as needed (for body pain).     aspirin EC 81 MG tablet  Take 81 mg by mouth daily.     brinzolamide 1 % ophthalmic suspension  Commonly known as:  AZOPT  Place 1 drop into both eyes 3 (three) times daily.     chlorhexidine 0.12 % solution  Commonly known as:  PERIDEX  Use as directed 15 mLs in the mouth or throat 2 (two) times daily.     enalapril 20 MG tablet  Commonly known as:  VASOTEC  Take 20 mg by mouth daily.     esomeprazole 40 MG capsule  Commonly known as:  NEXIUM  Take 40 mg by mouth daily at 12 noon.     hydrochlorothiazide 12.5 MG capsule  Commonly known as:  MICROZIDE  Take 12.5 mg by mouth daily.     levothyroxine 50 MCG tablet  Commonly known as:  SYNTHROID, LEVOTHROID  Take 50 mcg by mouth daily before breakfast.  nisoldipine 25.5 MG 24 hr tablet  Commonly known as:  SULAR  Take 25.5 mg by mouth daily.     simvastatin 20 MG tablet  Commonly known as:  ZOCOR  Take 20 mg by mouth daily.     VITAMIN D-3 PO  Take by mouth.        Disposition:       Future Appointments Provider Department Dept Phone   10/09/2013 10:15 AM Evans Lance, MD [place] Office [phone]       Duration of Discharge Encounter: Greater than 30 minutes including physician time.  Signed, Mikle Bosworth.D.

## 2013-09-27 NOTE — Progress Notes (Signed)
SUBJECTIVE: The patient is doing well today.  At this time, she denies chest pain, shortness of breath, or any new concerns.  Flecainide initiated yesterday with conversion to SR last night.  CURRENT MEDICATIONS: . aspirin EC  81 mg Oral Daily  . atorvastatin  10 mg Oral q1800  . brinzolamide  1 drop Both Eyes TID  . chlorhexidine  15 mL Mouth/Throat BID  . diltiazem  30 mg Oral 4 times per day  . enalapril  20 mg Oral Daily  . enoxaparin (LOVENOX) injection  40 mg Subcutaneous Q24H  . flecainide  75 mg Oral Q12H  . hydrochlorothiazide  12.5 mg Oral Daily  . levothyroxine  50 mcg Oral QAC breakfast  . pantoprazole  40 mg Oral Daily  . sodium chloride  3 mL Intravenous Q12H      OBJECTIVE: Physical Exam: Filed Vitals:   09/26/13 2000 09/27/13 0000 09/27/13 0029 09/27/13 0400  BP: 102/48 117/59 115/62 114/58  Pulse: 61 58  58  Temp: 98.9 F (37.2 C) 98.4 F (36.9 C)  98.3 F (36.8 C)  TempSrc: Oral Oral  Oral  Resp: 16 16  16   Weight:      SpO2: 97% 96%  100%    Intake/Output Summary (Last 24 hours) at 09/27/13 3976 Last data filed at 09/26/13 1844  Gross per 24 hour  Intake    240 ml  Output      0 ml  Net    240 ml    Telemetry reveals conversion to sinus rhythm  frail appearing 73 yo woman, NAD  HEENT: Unremarkable,[place], AT  Neck: 6 JVD, no thyromegally  Back: No CVA tenderness  Lungs: Clear with no wheezes, rales, or rhonchi  HEART: IRegular rate rhythm, no murmurs, no rubs, no clicks  Abd: soft, positive bowel sounds, no organomegally, no rebound, no guarding  Ext: 2 plus pulses, no edema, no cyanosis, no clubbing  Skin: No rashes no nodules  Neuro: CN II through XII intact, motor grossly intact   LABS: Basic Metabolic Panel:  Recent Labs  09/25/13 1617 09/25/13 1901 09/26/13 0235  NA 133*  --  139  K 6.2*  --  3.5*  CL 97  --  104  CO2 24  --  23  GLUCOSE 109*  --  101*  BUN 11  --  12  CREATININE 0.63  --  0.65  CALCIUM 9.5  --  9.2    MG  --  1.9  --    CBC:  Recent Labs  09/25/13 1617  WBC 10.6*  HGB 15.6*  HCT 46.7*  MCV 80.4  PLT 289   Cardiac Enzymes:  Recent Labs  09/25/13 1730  TROPONINI <0.30   Hemoglobin A1C:  Recent Labs  09/25/13 1901  HGBA1C 6.2*   Thyroid Function Tests:  Recent Labs  09/25/13 1902  TSH 1.730    RADIOLOGY: Dg Chest Portable 1 View 09/25/2013   CLINICAL DATA:  Tachycardia and chest pain  EXAM: PORTABLE CHEST - 1 VIEW  COMPARISON:  09/10/2013  FINDINGS: Monitoring devices are noted overlying the chest. The cardiac shadow is mildly enlarged but stable. Postsurgical changes in the left apex are seen. No definitive infiltrate is noted. No acute bony abnormality is seen.  IMPRESSION: No acute abnormality noted.   Electronically Signed   By: Inez Catalina M.D.   On: 09/25/2013 16:36    ASSESSMENT AND PLAN:  Principal Problem:   PSVT (paroxysmal supraventricular tachycardia) Active Problems:  Paroxysmal supraventricular tachycardia  CHADS2VASC is 4 Rec: She is ready for discharge home on flecainide 75 mg twice daily and cardizem 180 mg daily. I will see back in a few weeks. I would like her to be discharged on anti-coagulation as she appears to transition from atrial tachycardia to atrial fib and flutter. Eliquis 5 mg twice daily would be preferred.   Follow up scheduled for 4-16 at 10:15AM  [facility].D.

## 2013-09-27 NOTE — Plan of Care (Signed)
Just FYI in addition to DC summary:  - Simvastatin was changed to Lipitor during this admission due to interaction with Diltiazem. - Per Dr. [name], DC aspirin given initiation of Eliquis.  Reda Citron PA-C

## 2013-09-29 ENCOUNTER — Telehealth: Payer: Self-pay | Admitting: *Deleted

## 2013-09-29 ENCOUNTER — Telehealth: Payer: Self-pay | Admitting: Internal Medicine

## 2013-09-29 NOTE — Telephone Encounter (Signed)
PA to [facility] for ELIQUIS 

## 2013-09-29 NOTE — Telephone Encounter (Signed)
New message ° ° ° ° °Questions about medications °

## 2013-09-29 NOTE — Telephone Encounter (Signed)
Pt advised to take flecainide 150mg  1/2 tablet (75mg ) every 12 hours according to discharge instructions.

## 2013-10-09 ENCOUNTER — Ambulatory Visit (INDEPENDENT_AMBULATORY_CARE_PROVIDER_SITE_OTHER): Payer: Medicare Other | Admitting: Internal Medicine

## 2013-10-09 ENCOUNTER — Encounter: Payer: Self-pay | Admitting: Internal Medicine

## 2013-10-09 VITALS — BP 143/71 | HR 56 | Ht 62.0 in | Wt 138.0 lb

## 2013-10-09 DIAGNOSIS — R55 Syncope and collapse: Secondary | ICD-10-CM | POA: Diagnosis not present

## 2013-10-09 DIAGNOSIS — I4892 Unspecified atrial flutter: Secondary | ICD-10-CM | POA: Diagnosis not present

## 2013-10-09 DIAGNOSIS — I471 Supraventricular tachycardia: Secondary | ICD-10-CM | POA: Diagnosis not present

## 2013-10-09 MED ORDER — FLECAINIDE ACETATE 150 MG PO TABS: 75.0000 mg | ORAL_TABLET | Freq: Two times a day (BID) | ORAL | Status: DC

## 2013-10-09 NOTE — Assessment & Plan Note (Signed)
Her symptoms appear to be well controlled. She will continue flecainide. I will have her walk on the treadmill.

## 2013-10-09 NOTE — Patient Instructions (Addendum)
Your physician wants you to follow-up in: 6 months with Dr Knox Saliva will receive a reminder letter in the mail two months in advance. If you don't receive a letter, please call our office to schedule the follow-up appointment.  Your physician has requested that you have an exercise tolerance test. For further information please visit [email or web address]. Please also follow instruction sheet, as given.

## 2013-10-09 NOTE — Progress Notes (Signed)
HPI Stephanie Simmons returns today for followup. She is a pleasant 73 yo woman with a h/o atrial tachycardia and fibrillation with a RVR who was found to have HR's of over 210/min. She was placed on flecainide which appeared to improve her arrythmia and was discharged home. She has worn a cardiac monitor for the past 2 weeks since DC and her arrhythmias are much improved. She has had less than 5% SVT/Afib. She denies syncope. She is sedentary. No edema or chest pain. She has not yet had a treadmill test on flecainide.  No Known Allergies   Current Outpatient Prescriptions  Medication Sig Dispense Refill  . acetaminophen (TYLENOL) 500 MG tablet Take 1,000 mg by mouth 2 (two) times daily as needed (for body pain).      Marland Kitchen apixaban (ELIQUIS) 5 MG TABS tablet Take 1 tablet (5 mg total) by mouth 2 (two) times daily.  60 tablet  6  . atorvastatin (LIPITOR) 10 MG tablet Take 1 tablet (10 mg total) by mouth every evening.  30 tablet  6  . brinzolamide (AZOPT) 1 % ophthalmic suspension Place 1 drop into both eyes 3 (three) times daily.      . chlorhexidine (PERIDEX) 0.12 % solution Use as directed 15 mLs in the mouth or throat 2 (two) times daily.      . Cholecalciferol (VITAMIN D-3 PO) Take by mouth.      . diltiazem (CARDIZEM CD) 180 MG 24 hr capsule Take 1 capsule (180 mg total) by mouth daily.  30 capsule  6  . enalapril (VASOTEC) 20 MG tablet Take 20 mg by mouth daily.      Marland Kitchen esomeprazole (NEXIUM) 40 MG capsule Take 40 mg by mouth daily at 12 noon.      . flecainide (TAMBOCOR) 150 MG tablet Take 0.5 tablets (75 mg total) by mouth every 12 (twelve) hours.  90 tablet  3  . hydrochlorothiazide (MICROZIDE) 12.5 MG capsule Take 12.5 mg by mouth daily.      Marland Kitchen levothyroxine (SYNTHROID, LEVOTHROID) 50 MCG tablet Take 50 mcg by mouth daily before breakfast.       No current facility-administered medications for this visit.     Past Medical History  Diagnosis Date  . Hypertension   . Hypothyroidism    . GERD (gastroesophageal reflux disease)   . Hyperlipidemia   . Chest pain     a. 04/2009 Cath: essentially nl cors, EF 65%, mod LVH.  Marland Kitchen LVH (left ventricular hypertrophy)     a. 04/2009 Echo: EF 55-65%, mild conc LVH, no reg wma, Gr 1 DD, mild MR.    ROS:   All systems reviewed and negative except as noted in the HPI.   Past Surgical History  Procedure Laterality Date  . Abdominal hysterectomy    . Removal of mass- pleural       Family History  Problem Relation Age of Onset  . Other Mother     s/p PPM, died @ 48  . Heart attack Father     died @ 55.     History   Social History  . Marital Status: Single    Spouse Name: N/A    Number of Children: N/A  . Years of Education: N/A   Occupational History  . Not on file.   Social History Main Topics  . Smoking status: Never Smoker   . Smokeless tobacco: Not on file  . Alcohol Use: No  . Drug Use: No  .  Sexual Activity: Not on file   Other Topics Concern  . Not on file   Social History Narrative   Lives in [place] by herself.     BP 143/71  Pulse 56  Ht 5\' 2"  (1.575 m)  Wt 138 lb (62.596 kg)  BMI 25.23 kg/m2  Physical Exam:  Well appearing 73 yo woma, NAD HEENT: Unremarkable Neck:  No JVD, no thyromegally Back:  No CVA tenderness Lungs:  Clear with no wheezes HEART:  Regular rate rhythm, no murmurs, no rubs, no clicks Abd:  soft, positive bowel sounds, no organomegally, no rebound, no guarding Ext:  2 plus pulses, no edema, no cyanosis, no clubbing Skin:  No rashes no nodules Neuro:  CN II through XII intact, motor grossly intact  EKG - nsr    Assess/Plan:

## 2013-10-09 NOTE — Assessment & Plan Note (Signed)
No recurrent symptoms since starting her flecainide. Her syncope seems more likely due to tachy rather than post-termination pauses.

## 2013-10-10 ENCOUNTER — Encounter (HOSPITAL_COMMUNITY): Payer: Medicare Other

## 2013-10-22 ENCOUNTER — Telehealth (HOSPITAL_COMMUNITY): Payer: Self-pay

## 2013-10-24 ENCOUNTER — Ambulatory Visit (HOSPITAL_COMMUNITY)
Admission: RE | Admit: 2013-10-24 | Discharge: 2013-10-24 | Disposition: A | Discharge status: Home / Self Care | Payer: Medicare Other | Source: Ambulatory Visit | Attending: Cardiovascular Disease | Admitting: Cardiovascular Disease

## 2013-10-24 DIAGNOSIS — I4892 Unspecified atrial flutter: Secondary | ICD-10-CM

## 2013-10-29 DIAGNOSIS — M899 Disorder of bone, unspecified: Secondary | ICD-10-CM | POA: Diagnosis not present

## 2013-10-29 DIAGNOSIS — E042 Nontoxic multinodular goiter: Secondary | ICD-10-CM | POA: Diagnosis not present

## 2013-10-29 DIAGNOSIS — I10 Essential (primary) hypertension: Secondary | ICD-10-CM | POA: Diagnosis not present

## 2013-10-29 DIAGNOSIS — Z79899 Other long term (current) drug therapy: Secondary | ICD-10-CM | POA: Diagnosis not present

## 2013-10-29 DIAGNOSIS — E559 Vitamin D deficiency, unspecified: Secondary | ICD-10-CM | POA: Diagnosis not present

## 2013-10-29 DIAGNOSIS — E782 Mixed hyperlipidemia: Secondary | ICD-10-CM | POA: Diagnosis not present

## 2013-10-29 DIAGNOSIS — M949 Disorder of cartilage, unspecified: Secondary | ICD-10-CM | POA: Diagnosis not present

## 2013-10-29 DIAGNOSIS — E039 Hypothyroidism, unspecified: Secondary | ICD-10-CM | POA: Diagnosis not present

## 2013-12-25 NOTE — Telephone Encounter (Signed)
Encounter complete. 

## 2014-01-01 NOTE — Telephone Encounter (Signed)
Encounter complete. 

## 2014-01-08 DIAGNOSIS — H4011X0 Primary open-angle glaucoma, stage unspecified: Secondary | ICD-10-CM | POA: Diagnosis not present

## 2014-01-08 DIAGNOSIS — H409 Unspecified glaucoma: Secondary | ICD-10-CM | POA: Diagnosis not present

## 2014-01-26 ENCOUNTER — Other Ambulatory Visit (HOSPITAL_COMMUNITY): Payer: Self-pay | Admitting: Family Medicine

## 2014-01-26 DIAGNOSIS — Z1231 Encounter for screening mammogram for malignant neoplasm of breast: Secondary | ICD-10-CM

## 2014-03-04 ENCOUNTER — Ambulatory Visit (HOSPITAL_COMMUNITY)
Admission: RE | Admit: 2014-03-04 | Discharge: 2014-03-04 | Disposition: A | Discharge status: Home / Self Care | Payer: Medicare Other | Source: Ambulatory Visit | Attending: Family Medicine | Admitting: Family Medicine

## 2014-03-04 DIAGNOSIS — Z1231 Encounter for screening mammogram for malignant neoplasm of breast: Secondary | ICD-10-CM | POA: Insufficient documentation

## 2014-03-16 ENCOUNTER — Telehealth: Payer: Self-pay | Admitting: Internal Medicine

## 2014-03-16 NOTE — Telephone Encounter (Signed)
Received request from Nurse fax box, documents faxed for surgical clearance. To: [place],DDS Fax number: [phone] Attention: 9.21.15/km

## 2014-03-27 DIAGNOSIS — M8580 Other specified disorders of bone density and structure, unspecified site: Secondary | ICD-10-CM | POA: Diagnosis not present

## 2014-03-27 DIAGNOSIS — E559 Vitamin D deficiency, unspecified: Secondary | ICD-10-CM | POA: Diagnosis not present

## 2014-03-27 DIAGNOSIS — J309 Allergic rhinitis, unspecified: Secondary | ICD-10-CM | POA: Diagnosis not present

## 2014-03-27 DIAGNOSIS — Z23 Encounter for immunization: Secondary | ICD-10-CM | POA: Diagnosis not present

## 2014-03-27 DIAGNOSIS — E042 Nontoxic multinodular goiter: Secondary | ICD-10-CM | POA: Diagnosis not present

## 2014-03-27 DIAGNOSIS — I10 Essential (primary) hypertension: Secondary | ICD-10-CM | POA: Diagnosis not present

## 2014-03-27 DIAGNOSIS — K219 Gastro-esophageal reflux disease without esophagitis: Secondary | ICD-10-CM | POA: Diagnosis not present

## 2014-03-27 DIAGNOSIS — E782 Mixed hyperlipidemia: Secondary | ICD-10-CM | POA: Diagnosis not present

## 2014-04-03 ENCOUNTER — Telehealth: Payer: Self-pay | Admitting: Hematology and Oncology

## 2014-04-03 NOTE — Telephone Encounter (Signed)
LEFT MESSAGE FOR PATIENT AND GAVE NP APPT FOR 10/22 @ 10 W/DR. [place]. LEFT CONTACT INFORMATION FOR PATIENT TO RETURN CALL TO CONFIRM MESSAGE WAS RECEIVED/NP APPT.

## 2014-04-15 ENCOUNTER — Ambulatory Visit (INDEPENDENT_AMBULATORY_CARE_PROVIDER_SITE_OTHER): Payer: Medicare Other | Admitting: Internal Medicine

## 2014-04-15 ENCOUNTER — Encounter: Payer: Self-pay | Admitting: Internal Medicine

## 2014-04-15 VITALS — BP 124/68 | HR 85 | Ht 62.5 in | Wt 139.0 lb

## 2014-04-15 DIAGNOSIS — I4891 Unspecified atrial fibrillation: Secondary | ICD-10-CM | POA: Insufficient documentation

## 2014-04-15 DIAGNOSIS — I10 Essential (primary) hypertension: Secondary | ICD-10-CM

## 2014-04-15 DIAGNOSIS — I471 Supraventricular tachycardia: Secondary | ICD-10-CM

## 2014-04-15 DIAGNOSIS — I481 Persistent atrial fibrillation: Secondary | ICD-10-CM | POA: Diagnosis not present

## 2014-04-15 DIAGNOSIS — I4819 Other persistent atrial fibrillation: Secondary | ICD-10-CM

## 2014-04-15 DIAGNOSIS — I484 Atypical atrial flutter: Secondary | ICD-10-CM | POA: Diagnosis not present

## 2014-04-15 MED ORDER — DILTIAZEM HCL ER COATED BEADS 240 MG PO CP24: 240.0000 mg | ORAL_CAPSULE | Freq: Every day | ORAL | Status: DC

## 2014-04-15 NOTE — Patient Instructions (Signed)
Your physician has recommended you make the following change in your medication:  1) STOP Flecainide 2) INCREASE Diltiazem to 240 mg daily  Your physician wants you to follow-up in: 6 months with Dr. [name].  You will receive a reminder letter in the mail two months in advance. If you don't receive a letter, please call our office to schedule the follow-up appointment.

## 2014-04-15 NOTE — Assessment & Plan Note (Signed)
She has had no recurrent atrial flutter. No change in medical therapy with regard to her flutter.

## 2014-04-15 NOTE — Assessment & Plan Note (Signed)
Hierblood pressure today is well controlled. She will continue her current medications.

## 2014-04-15 NOTE — Assessment & Plan Note (Signed)
She is now reverted back to atrial fibrillation despite medical therapy with flecainide. Fortunately she has been asymptomatic, and did not know she was out of rhythm. With this information, I have recommended that the patient stop her flecainide, and transition to rate control. We will increase her dose of Cardizem from 180 mg daily up to 240 mg daily.

## 2014-04-15 NOTE — Progress Notes (Signed)
HPI Stephanie Simmons returns today for followup. She is a pleasant 73 yo woman with a h/o atrial tachycardia and fibrillation with a RVR. She was treated with flecainide with improvement in her symptoms. She unfortunately has had recurrent atrial fib. She has been asymptomatic and did not know that she was out of rhythm.     No Known Allergies   Current Outpatient Prescriptions  Medication Sig Dispense Refill  . acetaminophen (TYLENOL) 500 MG tablet Take 1,000 mg by mouth 2 (two) times daily as needed (for body pain).      Marland Kitchen apixaban (ELIQUIS) 5 MG TABS tablet Take 1 tablet (5 mg total) by mouth 2 (two) times daily.  60 tablet  6  . atorvastatin (LIPITOR) 10 MG tablet Take 1 tablet (10 mg total) by mouth every evening.  30 tablet  6  . brinzolamide (AZOPT) 1 % ophthalmic suspension Place 1 drop into both eyes 3 (three) times daily.      . chlorhexidine (PERIDEX) 0.12 % solution Use as directed 15 mLs in the mouth or throat 2 (two) times daily.      . Cholecalciferol (VITAMIN D-3 PO) Take 1 tablet by mouth every 30 (thirty) days.       Marland Kitchen diltiazem (CARDIZEM CD) 180 MG 24 hr capsule Take 1 capsule (180 mg total) by mouth daily.  30 capsule  6  . enalapril (VASOTEC) 20 MG tablet Take 20 mg by mouth daily.      Marland Kitchen esomeprazole (NEXIUM) 40 MG capsule Take 40 mg by mouth daily at 12 noon.      . flecainide (TAMBOCOR) 150 MG tablet Take 0.5 tablets (75 mg total) by mouth every 12 (twelve) hours.  90 tablet  3  . hydrochlorothiazide (MICROZIDE) 12.5 MG capsule Take 12.5 mg by mouth daily.      Marland Kitchen levothyroxine (SYNTHROID, LEVOTHROID) 50 MCG tablet Take 50 mcg by mouth daily before breakfast.       No current facility-administered medications for this visit.     Past Medical History  Diagnosis Date  . Hypertension   . Hypothyroidism   . GERD (gastroesophageal reflux disease)   . Hyperlipidemia   . Chest pain     a. 04/2009 Cath: essentially nl cors, EF 65%, mod LVH.  Marland Kitchen LVH (left  ventricular hypertrophy)     a. 04/2009 Echo: EF 55-65%, mild conc LVH, no reg wma, Gr 1 DD, mild MR.    ROS:   All systems reviewed and negative except as noted in the HPI.   Past Surgical History  Procedure Laterality Date  . Abdominal hysterectomy    . Removal of mass- pleural       Family History  Problem Relation Age of Onset  . Other Mother     s/p PPM, died @ 30  . Heart attack Father     died @ 42.     History   Social History  . Marital Status: Single    Spouse Name: N/A    Number of Children: N/A  . Years of Education: N/A   Occupational History  . Not on file.   Social History Main Topics  . Smoking status: Never Smoker   . Smokeless tobacco: Not on file  . Alcohol Use: No  . Drug Use: No  . Sexual Activity: Not on file   Other Topics Concern  . Not on file   Social History Narrative   Lives in [place] by herself.  BP 124/68  Pulse 85  Ht 5' 2.5" (1.588 m)  Wt 139 lb (63.05 kg)  BMI 25.00 kg/m2  Physical Exam:  Well appearing 73 yo woman, NAD HEENT: Unremarkable Neck:  No JVD, no thyromegally Back:  No CVA tenderness Lungs:  Clear with no wheezes HEART:  IRegular rate rhythm, no murmurs, no rubs, no clicks Abd:  soft, positive bowel sounds, no organomegally, no rebound, no guarding Ext:  2 plus pulses, no edema, no cyanosis, no clubbing Skin:  No rashes no nodules Neuro:  CN II through XII intact, motor grossly intact  EKG - atrial fibrillation   Assess/Plan:

## 2014-04-16 ENCOUNTER — Ambulatory Visit: Payer: Medicare Other

## 2014-04-16 ENCOUNTER — Ambulatory Visit: Payer: Medicare Other | Admitting: Hematology and Oncology

## 2014-04-21 ENCOUNTER — Encounter: Payer: Self-pay | Admitting: Hematology and Oncology

## 2014-04-21 ENCOUNTER — Ambulatory Visit (HOSPITAL_BASED_OUTPATIENT_CLINIC_OR_DEPARTMENT_OTHER): Payer: Medicare Other | Admitting: Hematology and Oncology

## 2014-04-21 ENCOUNTER — Telehealth: Payer: Self-pay | Admitting: Hematology and Oncology

## 2014-04-21 ENCOUNTER — Ambulatory Visit: Payer: Medicare Other

## 2014-04-21 VITALS — BP 134/75 | HR 107 | Temp 98.2°F | Resp 18 | Ht 62.5 in | Wt 139.5 lb

## 2014-04-21 DIAGNOSIS — Q8500 Neurofibromatosis, unspecified: Secondary | ICD-10-CM

## 2014-04-21 DIAGNOSIS — K088 Other specified disorders of teeth and supporting structures: Secondary | ICD-10-CM | POA: Diagnosis not present

## 2014-04-21 DIAGNOSIS — D72821 Monocytosis (symptomatic): Secondary | ICD-10-CM

## 2014-04-21 DIAGNOSIS — Z9189 Other specified personal risk factors, not elsewhere classified: Secondary | ICD-10-CM

## 2014-04-21 HISTORY — DX: Monocytosis (symptomatic): D72.821

## 2014-04-21 NOTE — Assessment & Plan Note (Signed)
This is chronic in nature, could be reactive from poor dentition versus undiagnosed CMML. She is not symptomatic. I recommend she proceed with dental work and see her back next year with repeat blood work. Clinically, she does not appear to be ill with chronic infection.

## 2014-04-21 NOTE — Assessment & Plan Note (Signed)
She has clinical signs of neurofibromatosis but remain asymptomatic. Recommend observation only.

## 2014-04-21 NOTE — Telephone Encounter (Signed)
Confirm D/t For July 2016 appt.

## 2014-04-21 NOTE — Progress Notes (Signed)
[facility] NOTE  Patient Care Team: Gerrit Heck, MD as PCP - General (Family Medicine)  CHIEF COMPLAINTS/PURPOSE OF CONSULTATION:  Chronic monocytosis  HISTORY OF PRESENTING ILLNESS:  Stephanie Simmons 73 y.o. female is here because of elevated monocyte count.  She was found to have abnormal CBC from 2010 with chronic relative monocytosis. She has very poor fitting dentures and poor oral hygiene. She has chronic pain. She denies recent infection. The last prescription antibiotics was more than 3 months ago There is not reported symptoms of sinus congestion, cough, urinary frequency/urgency or dysuria, diarrhea, joint swelling/pain or abnormal skin rash.  She had no prior history or diagnosis of cancer. Her age appropriate screening programs are up-to-date. The patient has no prior diagnosis of autoimmune disease and was not prescribed corticosteroids related products. MEDICAL HISTORY:  Past Medical History  Diagnosis Date  . Hypertension   . Hypothyroidism   . GERD (gastroesophageal reflux disease)   . Hyperlipidemia   . Chest pain     a. 04/2009 Cath: essentially nl cors, EF 65%, mod LVH.  Marland Kitchen LVH (left ventricular hypertrophy)     a. 04/2009 Echo: EF 55-65%, mild conc LVH, no reg wma, Gr 1 DD, mild MR.  . Monocytosis 04/21/2014    SURGICAL HISTORY: Past Surgical History  Procedure Laterality Date  . Abdominal hysterectomy    . Removal of mass- pleural      SOCIAL HISTORY: History   Social History  . Marital Status: Single    Spouse Name: N/A    Number of Children: N/A  . Years of Education: N/A   Occupational History  . Not on file.   Social History Main Topics  . Smoking status: Former Smoker    Quit date: 06/26/1992  . Smokeless tobacco: Never Used  . Alcohol Use: No  . Drug Use: No  . Sexual Activity: Not on file   Other Topics Concern  . Not on file   Social History Narrative   Lives in [place] by herself.    FAMILY  HISTORY: Family History  Problem Relation Age of Onset  . Other Mother     s/p PPM, died @ 95  . Heart attack Father     died @ 63.    ALLERGIES:  has No Known Allergies.  MEDICATIONS:  Current Outpatient Prescriptions  Medication Sig Dispense Refill  . acetaminophen (TYLENOL) 500 MG tablet Take 1,000 mg by mouth 2 (two) times daily as needed (for body pain).      Marland Kitchen apixaban (ELIQUIS) 5 MG TABS tablet Take 1 tablet (5 mg total) by mouth 2 (two) times daily.  60 tablet  6  . atorvastatin (LIPITOR) 10 MG tablet Take 1 tablet (10 mg total) by mouth every evening.  30 tablet  6  . brinzolamide (AZOPT) 1 % ophthalmic suspension Place 1 drop into both eyes 3 (three) times daily.      . chlorhexidine (PERIDEX) 0.12 % solution Use as directed 15 mLs in the mouth or throat 2 (two) times daily.      . Cholecalciferol (VITAMIN D-3 PO) Take 1 tablet by mouth every 30 (thirty) days.       Marland Kitchen diltiazem (CARDIZEM CD) 240 MG 24 hr capsule Take 1 capsule (240 mg total) by mouth daily.  30 capsule  6  . enalapril (VASOTEC) 20 MG tablet Take 20 mg by mouth daily.      Marland Kitchen esomeprazole (NEXIUM) 40 MG capsule Take 40 mg by mouth  daily at 12 noon.      . hydrochlorothiazide (MICROZIDE) 12.5 MG capsule Take 12.5 mg by mouth daily.      Marland Kitchen levothyroxine (SYNTHROID, LEVOTHROID) 50 MCG tablet Take 50 mcg by mouth daily before breakfast.       No current facility-administered medications for this visit.    REVIEW OF SYSTEMS:   Constitutional: Denies fevers, chills or abnormal night sweats Eyes: Denies blurriness of vision, double vision or watery eyes Ears, nose, mouth, throat, and face: Denies mucositis or sore throat Respiratory: Denies cough, dyspnea or wheezes Cardiovascular: Denies palpitation, chest discomfort or lower extremity swelling Gastrointestinal:  Denies nausea, heartburn or change in bowel habits. Skin: Denies abnormal skin rashes Lymphatics: Denies new lymphadenopathy or easy  bruising Neurological:Denies numbness, tingling or new weaknesses Behavioral/Psych: Mood is stable, no new changes  All other systems were reviewed with the patient and are negative.  PHYSICAL EXAMINATION: ECOG PERFORMANCE STATUS: 0 - Asymptomatic  Filed Vitals:   04/21/14 1413  BP: 134/75  Pulse: 107  Temp: 98.2 F (36.8 C)  Resp: 18   Filed Weights   04/21/14 1413  Weight: 139 lb 8 oz (63.277 kg)    GENERAL:alert, no distress and comfortable SKIN: skin color, texture, turgor are normal, no rashes or significant lesions. She has numerous skin lesions consistent with neurofibromatosis. EYES: normal, conjunctiva are pink and non-injected, sclera clear OROPHARYNX:no exudate, no erythema and lips, buccal mucosa, and tongue normal . Poor dentition and poor oral hygiene is noted NECK: supple, thyroid normal size, non-tender, without nodularity LYMPH:  no palpable lymphadenopathy in the cervical, axillary or inguinal LUNGS: clear to auscultation and percussion with normal breathing effort HEART: regular rate & rhythm and no murmurs and no lower extremity edema ABDOMEN:abdomen soft, non-tender and normal bowel sounds Musculoskeletal:no cyanosis of digits and no clubbing  PSYCH: alert & oriented x 3 with fluent speech NEURO: no focal motor/sensory deficits  LABORATORY DATA:  I have reviewed the data as listed ASSESSMENT & PLAN Monocytosis This is chronic in nature, could be reactive from poor dentition versus undiagnosed CMML. She is not symptomatic. I recommend she proceed with dental work and see her back next year with repeat blood work. Clinically, she does not appear to be ill with chronic infection.   Neurofibromatosis She has clinical signs of neurofibromatosis but remain asymptomatic. Recommend observation only.  Poor oral hygiene She has very poor oral hygiene and dental caries. I suspect this could be a source of infection/monocytosis. I recommended she proceed with  dental care immediately.

## 2014-04-21 NOTE — Assessment & Plan Note (Signed)
She has very poor oral hygiene and dental caries. I suspect this could be a source of infection/monocytosis. I recommended she proceed with dental care immediately.

## 2014-04-24 ENCOUNTER — Other Ambulatory Visit (HOSPITAL_COMMUNITY): Payer: Self-pay | Admitting: Physician Assistant

## 2014-04-27 ENCOUNTER — Other Ambulatory Visit: Payer: Self-pay | Admitting: Physician Assistant

## 2014-05-08 ENCOUNTER — Other Ambulatory Visit: Payer: Self-pay | Admitting: Physician Assistant

## 2014-05-25 DIAGNOSIS — Z23 Encounter for immunization: Secondary | ICD-10-CM | POA: Diagnosis not present

## 2014-05-25 DIAGNOSIS — E049 Nontoxic goiter, unspecified: Secondary | ICD-10-CM | POA: Diagnosis not present

## 2014-05-25 DIAGNOSIS — E063 Autoimmune thyroiditis: Secondary | ICD-10-CM | POA: Diagnosis not present

## 2014-05-27 DIAGNOSIS — H4011X1 Primary open-angle glaucoma, mild stage: Secondary | ICD-10-CM | POA: Diagnosis not present

## 2014-05-27 DIAGNOSIS — Z961 Presence of intraocular lens: Secondary | ICD-10-CM | POA: Diagnosis not present

## 2014-05-27 DIAGNOSIS — H18613 Keratoconus, stable, bilateral: Secondary | ICD-10-CM | POA: Diagnosis not present

## 2014-07-08 DIAGNOSIS — H4011X3 Primary open-angle glaucoma, severe stage: Secondary | ICD-10-CM | POA: Diagnosis not present

## 2014-07-29 DIAGNOSIS — H4011X3 Primary open-angle glaucoma, severe stage: Secondary | ICD-10-CM | POA: Diagnosis not present

## 2014-07-29 DIAGNOSIS — H18613 Keratoconus, stable, bilateral: Secondary | ICD-10-CM | POA: Diagnosis not present

## 2014-07-29 DIAGNOSIS — H01002 Unspecified blepharitis right lower eyelid: Secondary | ICD-10-CM | POA: Diagnosis not present

## 2014-07-29 DIAGNOSIS — H01001 Unspecified blepharitis right upper eyelid: Secondary | ICD-10-CM | POA: Diagnosis not present

## 2014-08-20 DIAGNOSIS — H18613 Keratoconus, stable, bilateral: Secondary | ICD-10-CM | POA: Diagnosis not present

## 2014-08-20 DIAGNOSIS — H4011X3 Primary open-angle glaucoma, severe stage: Secondary | ICD-10-CM | POA: Diagnosis not present

## 2014-08-27 DIAGNOSIS — H18603 Keratoconus, unspecified, bilateral: Secondary | ICD-10-CM | POA: Diagnosis not present

## 2014-08-27 DIAGNOSIS — Z961 Presence of intraocular lens: Secondary | ICD-10-CM | POA: Diagnosis not present

## 2014-08-27 DIAGNOSIS — H179 Unspecified corneal scar and opacity: Secondary | ICD-10-CM | POA: Diagnosis not present

## 2014-08-27 DIAGNOSIS — H4011X3 Primary open-angle glaucoma, severe stage: Secondary | ICD-10-CM | POA: Diagnosis not present

## 2014-09-23 ENCOUNTER — Other Ambulatory Visit (HOSPITAL_COMMUNITY): Payer: Self-pay

## 2014-09-23 ENCOUNTER — Encounter (HOSPITAL_COMMUNITY): Payer: Self-pay

## 2014-09-23 ENCOUNTER — Other Ambulatory Visit: Payer: Self-pay

## 2014-09-23 ENCOUNTER — Emergency Department (HOSPITAL_COMMUNITY): Payer: Medicare Other

## 2014-09-23 ENCOUNTER — Inpatient Hospital Stay (HOSPITAL_COMMUNITY)
Admission: EM | Admit: 2014-09-23 | Discharge: 2014-09-25 | DRG: 292 | Disposition: A | Discharge status: Home / Self Care | Payer: Medicare Other | Attending: Internal Medicine | Admitting: Internal Medicine

## 2014-09-23 DIAGNOSIS — E039 Hypothyroidism, unspecified: Secondary | ICD-10-CM | POA: Diagnosis present

## 2014-09-23 DIAGNOSIS — Z7901 Long term (current) use of anticoagulants: Secondary | ICD-10-CM

## 2014-09-23 DIAGNOSIS — K219 Gastro-esophageal reflux disease without esophagitis: Secondary | ICD-10-CM | POA: Diagnosis present

## 2014-09-23 DIAGNOSIS — R0602 Shortness of breath: Secondary | ICD-10-CM | POA: Diagnosis not present

## 2014-09-23 DIAGNOSIS — R079 Chest pain, unspecified: Secondary | ICD-10-CM | POA: Diagnosis not present

## 2014-09-23 DIAGNOSIS — I481 Persistent atrial fibrillation: Secondary | ICD-10-CM | POA: Diagnosis not present

## 2014-09-23 DIAGNOSIS — I10 Essential (primary) hypertension: Secondary | ICD-10-CM | POA: Diagnosis present

## 2014-09-23 DIAGNOSIS — I480 Paroxysmal atrial fibrillation: Secondary | ICD-10-CM | POA: Diagnosis not present

## 2014-09-23 DIAGNOSIS — Z87891 Personal history of nicotine dependence: Secondary | ICD-10-CM

## 2014-09-23 DIAGNOSIS — E785 Hyperlipidemia, unspecified: Secondary | ICD-10-CM | POA: Diagnosis present

## 2014-09-23 DIAGNOSIS — I5031 Acute diastolic (congestive) heart failure: Secondary | ICD-10-CM | POA: Diagnosis not present

## 2014-09-23 DIAGNOSIS — Z79899 Other long term (current) drug therapy: Secondary | ICD-10-CM

## 2014-09-23 DIAGNOSIS — R0600 Dyspnea, unspecified: Secondary | ICD-10-CM | POA: Diagnosis not present

## 2014-09-23 DIAGNOSIS — C9210 Chronic myeloid leukemia, BCR/ABL-positive, not having achieved remission: Secondary | ICD-10-CM | POA: Diagnosis present

## 2014-09-23 DIAGNOSIS — R0789 Other chest pain: Secondary | ICD-10-CM | POA: Diagnosis not present

## 2014-09-23 DIAGNOSIS — I4891 Unspecified atrial fibrillation: Secondary | ICD-10-CM | POA: Diagnosis present

## 2014-09-23 DIAGNOSIS — I5033 Acute on chronic diastolic (congestive) heart failure: Secondary | ICD-10-CM | POA: Diagnosis not present

## 2014-09-23 DIAGNOSIS — R0609 Other forms of dyspnea: Secondary | ICD-10-CM | POA: Diagnosis not present

## 2014-09-23 DIAGNOSIS — R069 Unspecified abnormalities of breathing: Secondary | ICD-10-CM | POA: Diagnosis not present

## 2014-09-23 DIAGNOSIS — Z8679 Personal history of other diseases of the circulatory system: Secondary | ICD-10-CM | POA: Diagnosis not present

## 2014-09-23 LAB — BASIC METABOLIC PANEL
Anion gap: 13 (ref 5–15)
BUN: 13 mg/dL (ref 6–23)
CO2: 23 mmol/L (ref 19–32)
Calcium: 10.4 mg/dL (ref 8.4–10.5)
Chloride: 102 mmol/L (ref 96–112)
Creatinine, Ser: 0.81 mg/dL (ref 0.50–1.10)
GFR calc Af Amer: 81 mL/min — ABNORMAL LOW (ref >90)
GFR calc non Af Amer: 70 mL/min — ABNORMAL LOW (ref >90)
Glucose, Bld: 83 mg/dL (ref 70–99)
Potassium: 3.7 mmol/L (ref 3.5–5.1)
Sodium: 138 mmol/L (ref 135–145)

## 2014-09-23 LAB — CBC
HCT: 44.6 % (ref 36.0–46.0)
Hemoglobin: 14.3 g/dL (ref 12.0–15.0)
MCH: 26 pg (ref 26.0–34.0)
MCHC: 32.1 g/dL (ref 30.0–36.0)
MCV: 81.2 fL (ref 78.0–100.0)
Platelets: 278 10*3/uL (ref 150–400)
RBC: 5.49 MIL/uL — AB (ref 3.87–5.11)
RDW: 14.5 % (ref 11.5–15.5)
WBC: 8.4 10*3/uL (ref 4.0–10.5)

## 2014-09-23 LAB — BRAIN NATRIURETIC PEPTIDE: B Natriuretic Peptide: 553.4 pg/mL — ABNORMAL HIGH (ref 0.0–100.0)

## 2014-09-23 LAB — TROPONIN I: Troponin I: <0.03 ng/mL (ref <0.031)

## 2014-09-23 MED ORDER — HYDROCHLOROTHIAZIDE 12.5 MG PO CAPS
12.5000 mg | ORAL_CAPSULE | Freq: Every day | ORAL | Status: DC
  Administered 2014-09-24 – 2014-09-25 (×2): 12.5 mg via ORAL
  Filled 2014-09-23 (×2): qty 1

## 2014-09-23 MED ORDER — FUROSEMIDE 10 MG/ML IJ SOLN
40.0000 mg | Freq: Once | INTRAMUSCULAR | Status: AC
  Administered 2014-09-23: 40 mg via INTRAVENOUS
  Filled 2014-09-23: qty 4

## 2014-09-23 MED ORDER — LATANOPROST 0.005 % OP SOLN
1.0000 [drp] | Freq: Every day | OPHTHALMIC | Status: DC
  Administered 2014-09-23 – 2014-09-24 (×2): 1 [drp] via OPHTHALMIC
  Filled 2014-09-23 (×2): qty 2.5

## 2014-09-23 MED ORDER — ENALAPRIL MALEATE 5 MG PO TABS
20.0000 mg | ORAL_TABLET | Freq: Every day | ORAL | Status: DC
  Administered 2014-09-24 – 2014-09-25 (×2): 20 mg via ORAL
  Filled 2014-09-23: qty 8
  Filled 2014-09-23 (×2): qty 4

## 2014-09-23 MED ORDER — DILTIAZEM HCL ER COATED BEADS 240 MG PO CP24
240.0000 mg | ORAL_CAPSULE | Freq: Every day | ORAL | Status: DC
  Administered 2014-09-23 – 2014-09-24 (×2): 240 mg via ORAL
  Filled 2014-09-23 (×2): qty 1

## 2014-09-23 MED ORDER — ACETAMINOPHEN 500 MG PO TABS: 1000.0000 mg | ORAL_TABLET | Freq: Two times a day (BID) | ORAL | Status: DC | PRN

## 2014-09-23 MED ORDER — CHLORHEXIDINE GLUCONATE 0.12 % MT SOLN
15.0000 mL | Freq: Two times a day (BID) | OROMUCOSAL | Status: DC
  Administered 2014-09-23 – 2014-09-25 (×4): 15 mL via OROMUCOSAL
  Filled 2014-09-23 (×4): qty 15

## 2014-09-23 MED ORDER — PANTOPRAZOLE SODIUM 40 MG PO TBEC
40.0000 mg | DELAYED_RELEASE_TABLET | Freq: Every day | ORAL | Status: DC
  Administered 2014-09-24 – 2014-09-25 (×2): 40 mg via ORAL
  Filled 2014-09-23 (×2): qty 1

## 2014-09-23 MED ORDER — FUROSEMIDE 10 MG/ML IJ SOLN
40.0000 mg | Freq: Two times a day (BID) | INTRAMUSCULAR | Status: DC
  Filled 2014-09-23: qty 4

## 2014-09-23 MED ORDER — BRINZOLAMIDE 1 % OP SUSP
1.0000 [drp] | Freq: Three times a day (TID) | OPHTHALMIC | Status: DC
  Administered 2014-09-23 – 2014-09-25 (×5): 1 [drp] via OPHTHALMIC
  Filled 2014-09-23: qty 10

## 2014-09-23 MED ORDER — ATORVASTATIN CALCIUM 10 MG PO TABS
10.0000 mg | ORAL_TABLET | Freq: Every day | ORAL | Status: DC
  Administered 2014-09-23 – 2014-09-24 (×2): 10 mg via ORAL
  Filled 2014-09-23 (×2): qty 1

## 2014-09-23 MED ORDER — APIXABAN 5 MG PO TABS
5.0000 mg | ORAL_TABLET | Freq: Two times a day (BID) | ORAL | Status: DC
  Administered 2014-09-23 – 2014-09-25 (×4): 5 mg via ORAL
  Filled 2014-09-23 (×4): qty 1

## 2014-09-23 NOTE — ED Notes (Signed)
Per Admitting MD, pt is not to go upstairs until pharmacy does med rec. ED Pharmacy called. States they will send someone.

## 2014-09-23 NOTE — ED Notes (Signed)
Pharm tech at bedside 

## 2014-09-23 NOTE — H&P (Signed)
INTERNAL MEDICINE INPATIENT HISTORY AND PHYSICAL EXAMINATION NOTE  Patient ID: Stephanie Simmons MRN: [id number], DOB/AGE: 1940/07/31   Admit date: 09/23/2014  Primary Physician: Gerrit Heck, MD Primary cardiologist: Raeford Razor MD  Reason for admission: CHF exacerbation CC: Dyspnea  HPI: This is a 74 y.o. female with no known history of CAD and risk factors (hypertension, HLD), SVT, GERD, LHC 2010 normal corrs, LVH, atrial flutter (on xarelto), hypothyroidism who presented with shortness of breath. Patient was in his usual state of health about 3-4 months ago. She has been having symptoms of dyspnea since then which have been getting worse. She denied having any leg swelling, PND, orthopnea associated with it.  Today she was feeling generally bad with feeling of unbalance and went to her PCP Dr. [name] where among seeral things she complained of chest pain. She said that chest pain is not major concern. It is just sharp feeling sometimes occur for few seconds in the middle of chest without radiation. She had a left heart cath 6 years ago with normal coronories and this chest pain is quite chronic.  However, she was sent to the ED. In the ED, CXR showed some mild congestion. Her vitals were stable, oxygenating without need for supplemental oxygen. Her BNP was 553. In the ED, IV lasix was given with good response.  She has poor functional status and is not very mobile at home. Her sister is accompanying her and told that she is not mobile at home because she thinks that her cardiologist told her not to overdo anything.  ASSESSMENT AND PLAN:  This is a 74 y.o. black female with no known history of CAD and risk factors (hypertension, HLD), SVT, GERD, LHC 2010 normal corrs, LVH, atrial flutter (on xarelto), hypothyroidism who presented with shortness of breath.   Acute on chronic diastolic CHF (congestive heart failure), NYHA class 3 Likely secondary to diet and elevated blood  pressure Mild congestion on the CXR, elevated BNP Will diurese  Control BP Evaluate for ischemia with troponin Stress echo tomorrow morning   Chest pain, atypical Intermediate risk of CAD Prior cath negative >5 years ago  Cycle troponin EKG obtained and wnl  Stress echocardiogram in the morning ordered NPO post midnight  Active Problems:   HTN (hypertension) Continue home medications    Atrial fibrillation, rate controlled and anticoagulated CHADS2VASC = 4 Continue anticoagulation    Continue rate control  Code status: Full DVT prophylaxis: SCD and xarelto Disposition: Admit and f/u Problem List: Past Medical History  Diagnosis Date  . Hypertension   . GERD (gastroesophageal reflux disease)   . Hyperlipidemia   . Chest pain     a. 04/2009 Cath: essentially nl cors, EF 65%, mod LVH.  Marland Kitchen LVH (left ventricular hypertrophy)     a. 04/2009 Echo: EF 55-65%, mild conc LVH, no reg wma, Gr 1 DD, mild MR.  . Monocytosis 04/21/2014  . Hypothyroidism     does not take medication for this any longer per patient     Past Surgical History  Procedure Laterality Date  . Abdominal hysterectomy    . Removal of mass- pleural       Allergies: No Known Allergies   Home Medications Current Facility-Administered Medications  Medication Dose Route Frequency Provider Last Rate Last Dose  . acetaminophen (TYLENOL) tablet 1,000 mg  1,000 mg Oral BID PRN Flossie Dibble, MD      . apixaban (ELIQUIS) tablet 5 mg  5 mg Oral BID Flossie Dibble, MD  5 mg at 09/23/14 2214  . atorvastatin (LIPITOR) tablet 10 mg  10 mg Oral q1800 Flossie Dibble, MD   10 mg at 09/23/14 2213  . brinzolamide (AZOPT) 1 % ophthalmic suspension 1 drop  1 drop Both Eyes TID Flossie Dibble, MD   1 drop at 09/23/14 2221  . chlorhexidine (PERIDEX) 0.12 % solution 15 mL  15 mL Mouth/Throat BID Flossie Dibble, MD   15 mL at 09/23/14 2215  . diltiazem (CARDIZEM CD) 24 hr capsule 240 mg  240 mg Oral Daily Flossie Dibble, MD   240 mg at 09/23/14 2217  . enalapril (VASOTEC) tablet 20 mg  20 mg Oral Daily Flossie Dibble, MD      . furosemide (LASIX) injection 40 mg  40 mg Intravenous Q12H Flossie Dibble, MD   40 mg at 09/23/14 2221  . hydrochlorothiazide (MICROZIDE) capsule 12.5 mg  12.5 mg Oral Daily Flossie Dibble, MD      . latanoprost (XALATAN) 0.005 % ophthalmic solution 1 drop  1 drop Both Eyes QHS Flossie Dibble, MD   1 drop at 09/23/14 2214  . pantoprazole (PROTONIX) EC tablet 40 mg  40 mg Oral Daily Flossie Dibble, MD         Family History  Problem Relation Age of Onset  . Other Mother     s/p PPM, died @ 4  . Heart attack Father     died @ 79.  . Other Father     enlarged heart   . Stroke Sister      History   Social History  . Marital Status: Single    Spouse Name: N/A  . Number of Children: N/A  . Years of Education: N/A   Occupational History  . Not on file.   Social History Main Topics  . Smoking status: Former Smoker    Types: Cigarettes    Quit date: 06/26/1992  . Smokeless tobacco: Never Used  . Alcohol Use: No  . Drug Use: No  . Sexual Activity: Not on file   Other Topics Concern  . Not on file   Social History Narrative   Lives in [place] by herself.     Review of Systems: General: negative for chills, fever, night sweats or weight changes.  Cardiovascular: chest pain, dyspnea negative for dyspnea on exertion, edema, orthopnea, palpitations, paroxysmal nocturnal dyspnea or shortness of breath  Dermatological: negative for rash Respiratory: negative for cough or wheezing Urologic: negative for hematuria Abdominal: negative for nausea, vomiting, diarrhea, bright red blood per rectum, melena, or hematemesis Neurologic: dizziness negative for visual changes, syncope, or dizziness  Physical Exam: Vitals: BP 112/73 mmHg  Pulse 89  Temp(Src) 97.7 F (36.5 C) (Oral)  Resp 11  Ht 5\' 2"  (1.575 m)  Wt 58.423 kg (128 lb 12.8 oz)  BMI 23.55 kg/m2  SpO2  99% General: not in acute distress Neck: JVP 8 cm of water, neck supple Heart: irregular rate and rhythm, S1, S2, no murmurs  Lungs: CTAB  GI: non tender, non distended, bowel sounds present Extremities: no edema Neuro: AAO x 3  Psych: normal affect, no anxiety   Labs:   Results for orders placed or performed during the hospital encounter of 09/23/14 (from the past 24 hour(s))  Basic metabolic panel     Status: Abnormal   Collection Time: 09/23/14  5:19 PM  Result Value Ref Range   Sodium 138 135 - 145 mmol/L   Potassium  3.7 3.5 - 5.1 mmol/L   Chloride 102 96 - 112 mmol/L   CO2 23 19 - 32 mmol/L   Glucose, Bld 83 70 - 99 mg/dL   BUN 13 6 - 23 mg/dL   Creatinine, Ser 0.81 0.50 - 1.10 mg/dL   Calcium 10.4 8.4 - 10.5 mg/dL   GFR calc non Af Amer 70 (L) >90 mL/min   GFR calc Af Amer 81 (L) >90 mL/min   Anion gap 13 5 - 15  Brain natriuretic peptide     Status: Abnormal   Collection Time: 09/23/14  5:19 PM  Result Value Ref Range   B Natriuretic Peptide 553.4 (H) 0.0 - 100.0 pg/mL  Troponin I     Status: None   Collection Time: 09/23/14  5:19 PM  Result Value Ref Range   Troponin I <0.03 <0.031 ng/mL  CBC     Status: Abnormal   Collection Time: 09/23/14  5:19 PM  Result Value Ref Range   WBC 8.4 4.0 - 10.5 K/uL   RBC 5.49 (H) 3.87 - 5.11 MIL/uL   Hemoglobin 14.3 12.0 - 15.0 g/dL   HCT 44.6 36.0 - 46.0 %   MCV 81.2 78.0 - 100.0 fL   MCH 26.0 26.0 - 34.0 pg   MCHC 32.1 30.0 - 36.0 g/dL   RDW 14.5 11.5 - 15.5 %   Platelets 278 150 - 400 K/uL     Radiology/Studies: Dg Chest 2 View  09/23/2014   CLINICAL DATA:  Atrial fibrillation. Shortness of breath earlier today. Hypertension.  EXAM: CHEST  2 VIEW  COMPARISON:  Earlier today  FINDINGS: 1735 hours. Surgical sutures overlie the left apex. Midline trachea. Mild cardiomegaly with suggestion of enlargement of the pulmonary outflow tract. Trace left pleural fluid or thickening. Left apical pleural thickening. No pneumothorax.  Mild pulmonary venous congestion is improved since earlier today. Left base airspace disease is also mildly improved.  IMPRESSION: Cardiomegaly with improvement in mild pulmonary venous congestion.  Trace left pleural fluid or thickening with improved adjacent atelectasis.  Pulmonary outflow tract prominence, as can be seen with pulmonary arterial hypertension.   Electronically Signed   By: Abigail Miyamoto M.D.   On: 09/23/2014 17:56    EKG: atrial fibrillation with rate of 91 with non specific ST/T wave changes  Echo: grade 3 diastolic dysfunction, mild MR  Cardiac cath: 2010, normal corrs, normal LVEF  Medical decision making:  Discussed care with the patient Discussed care with the physician on the phone Reviewed labs and imaging personally Reviewed prior records     Signed, Flossie Dibble, MD MS 09/24/2014, 12:30 AM

## 2014-09-23 NOTE — ED Provider Notes (Signed)
CSN: [id number]     Arrival date & time 09/23/14  1606 History   First MD Initiated Contact with Patient 09/23/14 1643     Chief Complaint  Patient presents with  . Atrial Fibrillation     (Consider location/radiation/quality/duration/timing/severity/associated sxs/prior Treatment) HPI Comments: Patient is a 74 year old female past medical history significant for atrial fibrillation with RVR, hypertension, hypothyroidism, hyperlipidemia, GERD presenting to the emergency department from her PCPs office for evaluation of atrial fibrillation. He was concerned that this is new onset atrial fibrillation. Patient initially went to see her PCP regarding gradually worsening dyspnea on exertion with regular activities such as white house work or walking to the mailbox over the last several months. Patient endorses intermittent episodes of chest pain. Last episode was in the office around 2:15 PM this afternoon. Last seen by Dr. [name] October 2015 switched to Cardizem from Flecainide.   Patient is a 74 y.o. female presenting with atrial fibrillation.  Atrial Fibrillation Associated symptoms include chest pain (intermittent). Pertinent negatives include no abdominal pain, headaches, nausea, numbness, vomiting or weakness.    Past Medical History  Diagnosis Date  . Hypertension   . Hypothyroidism   . GERD (gastroesophageal reflux disease)   . Hyperlipidemia   . Chest pain     a. 04/2009 Cath: essentially nl cors, EF 65%, mod LVH.  Marland Kitchen LVH (left ventricular hypertrophy)     a. 04/2009 Echo: EF 55-65%, mild conc LVH, no reg wma, Gr 1 DD, mild MR.  . Monocytosis 04/21/2014   Past Surgical History  Procedure Laterality Date  . Abdominal hysterectomy    . Removal of mass- pleural     Family History  Problem Relation Age of Onset  . Other Mother     s/p PPM, died @ 13  . Heart attack Father     died @ 108.   History  Substance Use Topics  . Smoking status: Former Smoker    Quit date:  06/26/1992  . Smokeless tobacco: Never Used  . Alcohol Use: No   OB History    No data available     Review of Systems  Respiratory:       Dyspnea on exertion  Cardiovascular: Positive for chest pain (intermittent). Negative for palpitations.  Gastrointestinal: Negative for nausea, vomiting and abdominal pain.  Neurological: Negative for dizziness, syncope, weakness, numbness and headaches.  All other systems reviewed and are negative.     Allergies  Review of patient's allergies indicates no known allergies.  Home Medications   Prior to Admission medications   Medication Sig Start Date End Date Taking? Authorizing Provider  acetaminophen (TYLENOL) 500 MG tablet Take 1,000 mg by mouth 2 (two) times daily as needed (for body pain).    Historical Provider, MD  atorvastatin (LIPITOR) 10 MG tablet TAKE 1 TABLET (10 MG TOTAL) BY MOUTH EVERY EVENING. 04/29/14   Evans Lance, MD  brinzolamide (AZOPT) 1 % ophthalmic suspension Place 1 drop into both eyes 3 (three) times daily.    Historical Provider, MD  chlorhexidine (PERIDEX) 0.12 % solution Use as directed 15 mLs in the mouth or throat 2 (two) times daily.    Historical Provider, MD  Cholecalciferol (VITAMIN D-3 PO) Take 1 tablet by mouth every 30 (thirty) days.     Historical Provider, MD  diltiazem (CARDIZEM CD) 240 MG 24 hr capsule Take 1 capsule (240 mg total) by mouth daily. 04/15/14   Evans Lance, MD  ELIQUIS 5 MG TABS tablet TAKE 1  TABLET (5 MG TOTAL) BY MOUTH 2 (TWO) TIMES DAILY. 05/08/14   Evans Lance, MD  enalapril (VASOTEC) 20 MG tablet Take 20 mg by mouth daily.    Historical Provider, MD  esomeprazole (NEXIUM) 40 MG capsule Take 40 mg by mouth daily at 12 noon.    Historical Provider, MD  hydrochlorothiazide (MICROZIDE) 12.5 MG capsule Take 12.5 mg by mouth daily.    Historical Provider, MD  levothyroxine (SYNTHROID, LEVOTHROID) 50 MCG tablet Take 50 mcg by mouth daily before breakfast.    Historical Provider, MD   LUMIGAN 0.01 % SOLN Place 1 drop into both eyes at bedtime. 09/04/14   Historical Provider, MD  SIMBRINZA 1-0.2 % SUSP Apply 1 drop to eye 3 (three) times daily. 07/09/14   Historical Provider, MD   BP 150/92 mmHg  Pulse 87  Temp(Src) 97.7 F (36.5 C) (Oral)  Resp 11  SpO2 100% Physical Exam  Constitutional: She is oriented to person, place, and time. She appears well-developed and well-nourished. No distress.  HENT:  Head: Normocephalic and atraumatic.  Right Ear: External ear normal.  Left Ear: External ear normal.  Nose: Nose normal.  Eyes: Conjunctivae are normal.  Neck: Neck supple.  Cardiovascular: Normal rate, normal heart sounds and intact distal pulses.  An irregularly irregular rhythm present.  No murmur heard. Pulmonary/Chest: Effort normal and breath sounds normal.  Abdominal: Soft. There is no tenderness.  Musculoskeletal: Normal range of motion. She exhibits no edema.  Neurological: She is alert and oriented to person, place, and time.  Skin: Skin is warm and dry. She is not diaphoretic.  Nursing note and vitals reviewed.   ED Course  Procedures (including critical care time) Medications  furosemide (LASIX) injection 40 mg (40 mg Intravenous Given 09/23/14 1842)    Labs Review Labs Reviewed  BASIC METABOLIC PANEL - Abnormal; Notable for the following:    GFR calc non Af Amer 70 (*)    GFR calc Af Amer 81 (*)    All other components within normal limits  BRAIN NATRIURETIC PEPTIDE - Abnormal; Notable for the following:    B Natriuretic Peptide 553.4 (*)    All other components within normal limits  CBC - Abnormal; Notable for the following:    RBC 5.49 (*)    All other components within normal limits  TROPONIN I    Imaging Review Dg Chest 2 View  09/23/2014   CLINICAL DATA:  Atrial fibrillation. Shortness of breath earlier today. Hypertension.  EXAM: CHEST  2 VIEW  COMPARISON:  Earlier today  FINDINGS: 1735 hours. Surgical sutures overlie the left apex.  Midline trachea. Mild cardiomegaly with suggestion of enlargement of the pulmonary outflow tract. Trace left pleural fluid or thickening. Left apical pleural thickening. No pneumothorax. Mild pulmonary venous congestion is improved since earlier today. Left base airspace disease is also mildly improved.  IMPRESSION: Cardiomegaly with improvement in mild pulmonary venous congestion.  Trace left pleural fluid or thickening with improved adjacent atelectasis.  Pulmonary outflow tract prominence, as can be seen with pulmonary arterial hypertension.   Electronically Signed   By: Abigail Miyamoto M.D.   On: 09/23/2014 17:56     EKG Interpretation None      ED ECG REPORT   Date: 09/23/2014  Rate: 103  Rhythm: atrial fibrillation  QRS Axis: right  Intervals: normal  ST/T Wave abnormalities: nonspecific ST/T changes  Conduction Disutrbances:none  Narrative Interpretation:   Old EKG Reviewed: changes noted nonspecific changes in lateral leads.  I have personally reviewed the EKG tracing and agree with the computerized printout as noted.  MDM   Final diagnoses:  Chest pain, atypical    Filed Vitals:   09/23/14 1845  BP: 150/92  Pulse:   Temp:   Resp: 11    Afebrile, NAD, non-toxic appearing, AAOx4.  I have reviewed nursing notes, vital signs, and all appropriate lab and imaging results for this patient. Patient presenting to the emergency department from PCP office for chest pain rule out. Patient is currently chest pain-free. She is rate controlled with an irregularly irregular rhythm consistent with A. fib. She is on Cardizem and Eliquis followed by Dr. [name]. First troponin negative. CXR with mild cardiomegaly with pulmonary vascular congestion. BNP elevated. IV Lasix ordered. EKG changes noted. Will  Admit patient for observation. Patient d/w with Dr. Winfred Leeds, agrees with plan.    Baron Sane, PA-C 09/23/14 2056  Orlie Dakin, MD 09/24/14 [phone]

## 2014-09-23 NOTE — ED Provider Notes (Signed)
Patient is a vague historian Patient with intermittent chest pain and worsening dyspnea on exertion for the past several weeks. Seen by her primary care physician earlier today sent here via EMS. Treated with aspirin and oxygen in the field. She is presently asymptomatic. On exam alert Glasgow Coma Score 15 HEENT exam extremities moist pink anicteric neck supple no JVD heart irregularly irregular lungs clear to auscultation abdomen nondistended nontender extremities without edema  Orlie Dakin, MD 09/23/14 1840

## 2014-09-23 NOTE — ED Notes (Signed)
Pt arrives from PCP office via [facility]. EMS was called with report of a flutter/a fib accompanied with sob and cp. Pt was in a fib en route and continues to be in a fib in ED. Pt is on Eliquis and Cardizem, but the pt doesn't know why she is taking either.

## 2014-09-24 DIAGNOSIS — R0600 Dyspnea, unspecified: Secondary | ICD-10-CM

## 2014-09-24 DIAGNOSIS — I481 Persistent atrial fibrillation: Secondary | ICD-10-CM

## 2014-09-24 LAB — TROPONIN I: Troponin I: <0.03 ng/mL (ref <0.031)

## 2014-09-24 MED ORDER — FUROSEMIDE 10 MG/ML IJ SOLN
40.0000 mg | Freq: Two times a day (BID) | INTRAMUSCULAR | Status: AC
Start: 2014-09-24 — End: 2014-09-24
  Administered 2014-09-24 (×2): 40 mg via INTRAVENOUS
  Filled 2014-09-24: qty 4

## 2014-09-24 MED ORDER — FUROSEMIDE 40 MG PO TABS
40.0000 mg | ORAL_TABLET | Freq: Every day | ORAL | Status: DC
  Administered 2014-09-25: 40 mg via ORAL
  Filled 2014-09-24: qty 1

## 2014-09-24 NOTE — Progress Notes (Signed)
Spoke with pt and pt family extensively. Pt and family is concerned about pt cardiac MD, would like to switch Outpatient. Pt also given Heart Healthy Diet Guidelines per request. Etta Quill, RN

## 2014-09-24 NOTE — Progress Notes (Signed)
Stephanie Simmons ZOX:[id number] DOB: [birth year] DOA: 09/23/2014 PCP: Gerrit Heck, MD  Brief narrative: 74 y/o ?, h/o clean Cardiac cath 04/2009-EF 67%, H/o SVT, atrial tachycardia/fibrillation-Chad2vasc score 4 currently on Eliquis office note-noted to have syncope 2/2 tachycardia >posttermination pauses--has worn a cardiac monitor in mid 2015., History isolated monocytosis [etiology? Poor dentition, query  CMML], potential neurofibromatosis admitted from PCP office after feeling malaise, short of breath and noted to be in sinus tachycardia after EKG was done.   Past medical history-As per Problem list Chart reviewed as below- Reviewed  Consultants:  Cardiology  Procedures:  None  Antibiotics:  None   Subjective   Doing better. No further chest pain States that she has not been on flecainide since October 2015 as Dr. [name] felt this was not benefiting her Tolerating diet fairly well No dark or tarry stool Unclear utility of Cardizem and I explained what its role was for her    Objective    Interim History: None  Telemetry: Rate controlled A. fib   Objective: Filed Vitals:   09/23/14 2039 09/23/14 2043 09/23/14 2216 09/24/14 0424  BP:   112/73 123/74  Pulse:    69  Temp:  97.7 F (36.5 C)  97.5 F (36.4 C)  TempSrc:  Oral  Oral  Resp:      Height: 5\' 2"  (1.575 m)     Weight: 58.423 kg (128 lb 12.8 oz)     SpO2:    98%    Intake/Output Summary (Last 24 hours) at 09/24/14 1522 Last data filed at 09/24/14 1000  Gross per 24 hour  Intake    200 ml  Output   1650 ml  Net  -1450 ml    Exam:  General: EOMI NCAT Cardiovascular: S1-S2 regular rhythm-3/6 murmur Respiratory: Clinically clear Abdomen: Soft nontender nondistended no rebound Skin no lower extremity edema Neuro neurologically intact  Data Reviewed: Basic Metabolic Panel:  Recent Labs Lab 09/23/14 1719  NA 138  K 3.7  CL 102  CO2 23  GLUCOSE 83  BUN 13  CREATININE  0.81  CALCIUM 10.4   Liver Function Tests: No results for input(s): AST, ALT, ALKPHOS, BILITOT, PROT, ALBUMIN in the last 168 hours. No results for input(s): LIPASE, AMYLASE in the last 168 hours. No results for input(s): AMMONIA in the last 168 hours. CBC:  Recent Labs Lab 09/23/14 1719  WBC 8.4  HGB 14.3  HCT 44.6  MCV 81.2  PLT 278   Cardiac Enzymes:  Recent Labs Lab 09/23/14 1719 09/24/14 1010  TROPONINI <0.03 <0.03   BNP: Invalid input(s): POCBNP CBG: No results for input(s): GLUCAP in the last 168 hours.  No results found for this or any previous visit (from the past 240 hour(s)).   Studies:              All Imaging reviewed and is as per above notation   Scheduled Meds: . apixaban  5 mg Oral BID  . atorvastatin  10 mg Oral q1800  . brinzolamide  1 drop Both Eyes TID  . chlorhexidine  15 mL Mouth/Throat BID  . diltiazem  240 mg Oral Daily  . enalapril  20 mg Oral Daily  . furosemide  40 mg Intravenous Q12H  . hydrochlorothiazide  12.5 mg Oral Daily  . latanoprost  1 drop Both Eyes QHS  . pantoprazole  40 mg Oral Daily   Continuous Infusions:    Assessment/Plan: 1. Chest pain-unclear etiology. Note essentially normal cardiac cath 04/2009 . Potentially  A. fib with RVR causing tachyarrhythmia causing some element of heart failure. Troponins so far have been negative. No further workup at this stage--cardiology Dr. Orene Desanctis recommended low risk strategy in terms of Lexiscan Myoview as an outpatient on follow-up 2. A. fib with RVR-continue Cardizem 240., Continue Eliquis 3. Probable tachyarrhythmia related heart failure,? NYHA class 2-3. ProBNP 553, echocardiogram this admission = 65-70%--she was given Lasix as a one-time dose 40 mg and has put out 1530 cc 3/31, cumulative out minus 1.6 L. Baseline weight 128. She will need to go home on Lasix 40 mg daily and follow-up as an outpatient with cardiology 4. Hypertension-continue Cardizem 240 daily, enalapril 20  daily, HCTZ 12.5 daily. Blood pressure is relatively well controlled 5. Reflux continue pantoprazole 40 daily 6. Hyperlipidemia continue atorvastatin 10 daily 7. Hypothyroidism-resolved? Not on medication 8. Monocytosis,? CMML--potentially related to poor dentition--outpatient follow-up  Code Status: Full Family Communication: None at bedside Disposition Plan: Probable discharge in a.m. if all stable   Verneita Griffes, MD  [facility] Pager [phone] 09/24/2014, 3:22 PM    LOS: 1 day

## 2014-09-24 NOTE — Progress Notes (Signed)
  Echocardiogram 2D Echocardiogram has been performed.  Stephanie Simmons 09/24/2014, 1:53 PM

## 2014-09-24 NOTE — Progress Notes (Signed)
Paged by Echo lab to do a stress echo. Cardiology was not consulted. Reading the note, it appears patient is on eliquis, came in with heart failure and also CP last night. Trop negative x1, no further trop since.   Patient is 15, per overnight MD/s note, she is not very mobile at home. I talked with patient, she states she can walk only a little, however would not be able to pick up pace on the treadmill. She is also in persistent a-fib with rate controlled, making her poor candidate for any treadmill or dobutamine test as removing her diltiazem would allow her to achieve target heart rate but will also likely push her into RVR.   I talked with Dr. Alinda Dooms who is covering the patient this morning, if risk of ACS is low, she can potentially have echo and lexiscan myoview, assume her CHF has resolved overnight or if he is unsure, formal cardiology consult should be obtained. We eventually decided on formal cardiology consult. I have infromed card master.  Hilbert Corrigan PA Pager: [phone]

## 2014-09-24 NOTE — Discharge Instructions (Signed)
Cardiac Diet This diet can help prevent heart disease and stroke. Many factors influence your heart health, including eating and exercise habits. Coronary risk rises a lot with abnormal blood fat (lipid) levels. Cardiac meal planning includes limiting unhealthy fats, increasing healthy fats, and making other small dietary changes. General guidelines are as follows:  Adjust calorie intake to reach and maintain desirable body weight.  Limit total fat intake to less than 30% of total calories. Saturated fat should be less than 7% of calories.  Saturated fats are found in animal products and in some vegetable products. Saturated vegetable fats are found in coconut oil, cocoa butter, palm oil, and palm kernel oil. Read labels carefully to avoid these products as much as possible. Use butter in moderation. Choose tub margarines and oils that have 2 grams of fat or less. Good cooking oils are canola and olive oils.  Practice low-fat cooking techniques. Do not fry food. Instead, broil, bake, boil, steam, grill, roast on a rack, stir-fry, or microwave it. Other fat reducing suggestions include:  Remove the skin from poultry.  Remove all visible fat from meats.  Skim the fat off stews, soups, and gravies before serving them.  Steam vegetables in water or broth instead of sauting them in fat.  Avoid foods with trans fat (or hydrogenated oils), such as commercially fried foods and commercially baked goods. Commercial shortening and deep-frying fats will contain trans fat.  Increase intake of fruits, vegetables, whole grains, and legumes to replace foods high in fat.  Increase consumption of nuts, legumes, and seeds to at least 4 servings weekly. One serving of a legume equals  cup, and 1 serving of nuts or seeds equals  cup.  Choose whole grains more often. Have 3 servings per day (a serving is 1 ounce [oz]).  Eat 4 to 5 servings of vegetables per day. A serving of vegetables is 1 cup of raw leafy  vegetables;  cup of raw or cooked cut-up vegetables;  cup of vegetable juice.  Eat 4 to 5 servings of fruit per day. A serving of fruit is 1 medium whole fruit;  cup of dried fruit;  cup of fresh, frozen, or canned fruit;  cup of 100% fruit juice.  Increase your intake of dietary fiber to 20 to 30 grams per day. Insoluble fiber may help lower your risk of heart disease and may help curb your appetite.  Soluble fiber binds cholesterol to be removed from the blood. Foods high in soluble fiber are dried beans, citrus fruits, oats, apples, bananas, broccoli, Brussels sprouts, and eggplant.  Try to include foods fortified with plant sterols or stanols, such as yogurt, breads, juices, or margarines. Choose several fortified foods to achieve a daily intake of 2 to 3 grams of plant sterols or stanols.  Foods with omega-3 fats can help reduce your risk of heart disease. Aim to have a 3.5 oz portion of fatty fish twice per week, such as salmon, mackerel, albacore tuna, sardines, lake trout, or herring. If you wish to take a fish oil supplement, choose one that contains 1 gram of both DHA and EPA.  Limit processed meats to 2 servings (3 oz portion) weekly.  Limit the sodium in your diet to 1500 milligrams (mg) per day. If you have high blood pressure, talk to a registered dietitian about a DASH (Dietary Approaches to Stop Hypertension) eating plan.  Limit sweets and beverages with added sugar, such as soda, to no more than 5 servings per week. One   serving is:   1 tablespoon sugar.  1 tablespoon jelly or jam.   cup sorbet.  1 cup lemonade.   cup regular soda. CHOOSING FOODS Starches  Allowed: Breads: All kinds (wheat, rye, raisin, white, oatmeal, Italian, French, and English muffin bread). Low-fat rolls: English muffins, frankfurter and hamburger buns, bagels, pita bread, tortillas (not fried). Pancakes, waffles, biscuits, and muffins made with recommended oil.  Avoid: Products made with  saturated or trans fats, oils, or whole milk products. Butter rolls, cheese breads, croissants. Commercial doughnuts, muffins, sweet rolls, biscuits, waffles, pancakes, store-bought mixes. Crackers  Allowed: Low-fat crackers and snacks: Animal, graham, rye, saltine (with recommended oil, no lard), oyster, and matzo crackers. Bread sticks, melba toast, rusks, flatbread, pretzels, and light popcorn.  Avoid: High-fat crackers: cheese crackers, butter crackers, and those made with coconut, palm oil, or trans fat (hydrogenated oils). Buttered popcorn. Cereals  Allowed: Hot or cold whole-grain cereals.  Avoid: Cereals containing coconut, hydrogenated vegetable fat, or animal fat. Potatoes / Pasta / Rice  Allowed: All kinds of potatoes, rice, and pasta (such as macaroni, spaghetti, and noodles).  Avoid: Pasta or rice prepared with cream sauce or high-fat cheese. Chow mein noodles, French fries. Vegetables  Allowed: All vegetables and vegetable juices.  Avoid: Fried vegetables. Vegetables in cream, butter, or high-fat cheese sauces. Limit coconut. Fruit in cream or custard. Protein  Allowed: Limit your intake of meat, seafood, and poultry to no more than 6 oz (cooked weight) per day. All lean, well-trimmed beef, veal, pork, and lamb. All chicken and turkey without skin. All fish and shellfish. Wild game: wild duck, rabbit, pheasant, and venison. Egg whites or low-cholesterol egg substitutes may be used as desired. Meatless dishes: recipes with dried beans, peas, lentils, and tofu (soybean curd). Seeds and nuts: all seeds and most nuts.  Avoid: Prime grade and other heavily marbled and fatty meats, such as short ribs, spare ribs, rib eye roast or steak, frankfurters, sausage, bacon, and high-fat luncheon meats, mutton. Caviar. Commercially fried fish. Domestic duck, goose, venison sausage. Organ meats: liver, gizzard, heart, chitterlings, brains, kidney, sweetbreads. Dairy  Allowed: Low-fat  cheeses: nonfat or low-fat cottage cheese (1% or 2% fat), cheeses made with part skim milk, such as mozzarella, farmers, string, or ricotta. (Cheeses should be labeled no more than 2 to 6 grams fat per oz.). Skim (or 1%) milk: liquid, powdered, or evaporated. Buttermilk made with low-fat milk. Drinks made with skim or low-fat milk or cocoa. Chocolate milk or cocoa made with skim or low-fat (1%) milk. Nonfat or low-fat yogurt.  Avoid: Whole milk cheeses, including colby, cheddar, muenster, Monterey Jack, Havarti, Brie, Camembert, American, Swiss, and blue. Creamed cottage cheese, cream cheese. Whole milk and whole milk products, including buttermilk or yogurt made from whole milk, drinks made from whole milk. Condensed milk, evaporated whole milk, and 2% milk. Soups and Combination Foods  Allowed: Low-fat low-sodium soups: broth, dehydrated soups, homemade broth, soups with the fat removed, homemade cream soups made with skim or low-fat milk. Low-fat spaghetti, lasagna, chili, and Spanish rice if low-fat ingredients and low-fat cooking techniques are used.  Avoid: Cream soups made with whole milk, cream, or high-fat cheese. All other soups. Desserts and Sweets  Allowed: Sherbet, fruit ices, gelatins, meringues, and angel food cake. Homemade desserts with recommended fats, oils, and milk products. Jam, jelly, honey, marmalade, sugars, and syrups. Pure sugar candy, such as gum drops, hard candy, jelly beans, marshmallows, mints, and small amounts of dark chocolate.  Avoid: Commercially prepared   cakes, pies, cookies, frosting, pudding, or mixes for these products. Desserts containing whole milk products, chocolate, coconut, lard, palm oil, or palm kernel oil. Ice cream or ice cream drinks. Candy that contains chocolate, coconut, butter, hydrogenated fat, or unknown ingredients. Buttered syrups. Fats and Oils  Allowed: Vegetable oils: safflower, sunflower, corn, soybean, cottonseed, sesame, canola, olive,  or peanut. Non-hydrogenated margarines. Salad dressing or mayonnaise: homemade or commercial, made with a recommended oil. Low or nonfat salad dressing or mayonnaise.  Limit added fats and oils to 6 to 8 tsp per day (includes fats used in cooking, baking, salads, and spreads on bread). Remember to count the "hidden fats" in foods.  Avoid: Solid fats and shortenings: butter, lard, salt pork, bacon drippings. Gravy containing meat fat, shortening, or suet. Cocoa butter, coconut. Coconut oil, palm oil, palm kernel oil, or hydrogenated oils: these ingredients are often used in bakery products, nondairy creamers, whipped toppings, candy, and commercially fried foods. Read labels carefully. Salad dressings made of unknown oils, sour cream, or cheese, such as blue cheese and Roquefort. Cream, all kinds: half-and-half, light, heavy, or whipping. Sour cream or cream cheese (even if "light" or low-fat). Nondairy cream substitutes: coffee creamers and sour cream substitutes made with palm, palm kernel, hydrogenated oils, or coconut oil. Beverages  Allowed: Coffee (regular or decaffeinated), tea. Diet carbonated beverages, mineral water. Alcohol: Check with your caregiver. Moderation is recommended.  Avoid: Whole milk, regular sodas, and juice drinks with added sugar. Condiments  Allowed: All seasonings and condiments. Cocoa powder. "Cream" sauces made with recommended ingredients.  Avoid: Carob powder made with hydrogenated fats. SAMPLE MENU Breakfast   cup orange juice   cup oatmeal  1 slice toast  1 tsp margarine  1 cup skim milk Lunch  Turkey sandwich with 2 oz turkey, 2 slices bread  Lettuce and tomato slices  Fresh fruit  Carrot sticks  Coffee or tea Snack  Fresh fruit or low-fat crackers Dinner  3 oz lean ground beef  1 baked potato  1 tsp margarine   cup asparagus  Lettuce salad  1 tbs non-creamy dressing   cup peach slices  1 cup skim milk Document Released:  03/21/2008 Document Revised: 12/12/2011 Document Reviewed: 08/12/2013 Exit[employer] Patient Information 2015 [employer]. This information is not intended to replace advice given to you by your health care provider. Make sure you discuss any questions you have with your health care provider.  

## 2014-09-24 NOTE — Progress Notes (Signed)
Chest pain is atypical, probably non cardiac. Risk factors are limited to age and HTN. Cradiac enzymes and ECG are low risk. She is in atrial fibrillation (a longstanding problem) and dobutamine echo is not the optimal choice for further testing. Suggest [employer], done as an outpatient. Please note that echo evaluation of "restrictive" diastolic function during atrial fibrillation is inaccurate, but the estimation of "high mean left atrial pressure" is correct (E/e' >15), consistent with the clinical presentation of acute exacerbation of heart failure.  Sanda Klein, MD, [facility] [phone] office [phone] pager

## 2014-09-25 DIAGNOSIS — I5031 Acute diastolic (congestive) heart failure: Secondary | ICD-10-CM

## 2014-09-25 DIAGNOSIS — I10 Essential (primary) hypertension: Secondary | ICD-10-CM

## 2014-09-25 DIAGNOSIS — I480 Paroxysmal atrial fibrillation: Secondary | ICD-10-CM

## 2014-09-25 DIAGNOSIS — R0789 Other chest pain: Secondary | ICD-10-CM

## 2014-09-25 DIAGNOSIS — I5033 Acute on chronic diastolic (congestive) heart failure: Principal | ICD-10-CM

## 2014-09-25 LAB — BASIC METABOLIC PANEL
Anion gap: 7 (ref 5–15)
BUN: 20 mg/dL (ref 6–23)
CALCIUM: 9.8 mg/dL (ref 8.4–10.5)
CO2: 29 mmol/L (ref 19–32)
CREATININE: 0.86 mg/dL (ref 0.50–1.10)
Chloride: 102 mmol/L (ref 96–112)
GFR calc Af Amer: 75 mL/min — ABNORMAL LOW (ref >90)
GFR, EST NON AFRICAN AMERICAN: 65 mL/min — AB (ref >90)
GLUCOSE: 101 mg/dL — AB (ref 70–99)
Potassium: 3.2 mmol/L — ABNORMAL LOW (ref 3.5–5.1)
SODIUM: 138 mmol/L (ref 135–145)

## 2014-09-25 LAB — MAGNESIUM: Magnesium: 1.8 mg/dL (ref 1.5–2.5)

## 2014-09-25 MED ORDER — FUROSEMIDE 40 MG PO TABS: 40.0000 mg | ORAL_TABLET | Freq: Every day | ORAL | Status: DC

## 2014-09-25 MED ORDER — DILTIAZEM HCL ER COATED BEADS 180 MG PO CP24
180.0000 mg | ORAL_CAPSULE | Freq: Every day | ORAL | Status: DC
  Administered 2014-09-25: 180 mg via ORAL
  Filled 2014-09-25: qty 1

## 2014-09-25 MED ORDER — POTASSIUM CHLORIDE CRYS ER 20 MEQ PO TBCR
40.0000 meq | EXTENDED_RELEASE_TABLET | Freq: Two times a day (BID) | ORAL | Status: DC
  Administered 2014-09-25: 40 meq via ORAL
  Filled 2014-09-25: qty 2

## 2014-09-25 MED ORDER — DILTIAZEM HCL ER COATED BEADS 180 MG PO CP24: 180.0000 mg | ORAL_CAPSULE | Freq: Every day | ORAL | Status: DC

## 2014-09-25 MED ORDER — POTASSIUM CHLORIDE CRYS ER 20 MEQ PO TBCR
40.0000 meq | EXTENDED_RELEASE_TABLET | Freq: Two times a day (BID) | ORAL | Status: DC
Start: 2014-09-25 — End: 2014-10-31

## 2014-09-25 NOTE — Discharge Summary (Signed)
Physician Discharge Summary  Stephanie Simmons DGU:[id number] DOB: 1940-08-30 DOA: 09/23/2014  PCP: Gerrit Heck, MD  Admit date: 09/23/2014 Discharge date: 09/25/2014  Time spent: 35 minutes  Recommendations for Outpatient Follow-up:  1. Recommend repeat complete metabolic panel as well as CBC in about one week at primary care physician's office 2. Recommend close follow-up with cardiologist for SVT, possible right-sided heart failure as an out 3. Patient given strict instructions regarding salt as well as Lasix as a discharge 4. Patient did not have any therapy requirement needs on discharge 5. She should follow-up when needed with her oncologist and potential he dentist for her other issues  Discharge Diagnoses:  Active Problems:   HTN (hypertension)   Atrial fibrillation   Chest pain, atypical   Acute on chronic diastolic CHF (congestive heart failure), NYHA class 3   Acute diastolic heart failure   Discharge Condition: Stable  Diet recommendation: Low-salt less than 2 g heart healthy diet  Filed Weights   09/23/14 2039 09/25/14 0526  Weight: 58.423 kg (128 lb 12.8 oz) 57.834 kg (127 lb 8 oz)    History of present illness:  74 y/o ?, h/o clean Cardiac cath 04/2009-EF 67%, H/o SVT, atrial tachycardia/fibrillation-Chad2vasc score 4 currently on Eliquis office note-noted to have syncope 2/2 tachycardia >posttermination pauses--has worn a cardiac monitor in mid 2015., History isolated monocytosis [etiology? Poor dentition, query CMML], potential neurofibromatosis admitted from PCP office after feeling malaise, short of breath and noted to be in sinus tachycardia after EKG was done. She states that she did not have any further chest pain but because of malaise she was observed in the hospital. It was noted that she would be a poor candidate for stress Myoview because of pre-existing SVT/atrial tachycardia and this was thought that once stabilized she could potentially get  a [facility] Cardiology saw her briefly and recommended out patient risk stratification as her findings were low risk Echocardiogram showed EF described as being vigorous, C5-7 7 PA peak pressure 36 mmHg and biatrial enlargement. This was suggestive of potential attenuation atrial fibrillation versus mild pulmonary hypertension and we scoped her in detail about salt in her diet as well as swelling and also SVT. She will need to be followed up by cardiology as an outpatient   Discharge Exam: Filed Vitals:   09/25/14 1346  BP: 96/60  Pulse: 73  Temp:   Resp: 16    General: EOMI NCAT S1-S2 tachycardic no murmur rub or gallop -rate controlled in the 80s to 90s range  Respiratory: Clinically clear   Discharge Instructions    ACE Inhibitor / ARB already ordered    Complete by:  As directed      Contraindication beta blocker-discharge    Complete by:  As directed      Diet - low sodium heart healthy    Complete by:  As directed      Discharge instructions    Complete by:  As directed   Will need close follow up with your primary care physician Dr. [name] as well as with her cardiologist Dr. [name]. We think that you had possibly heart failure on the right side of her heart causing you to have some of the shortness of breath and pain. He also had a fast heart causing these symptoms as well. If you get more than 2-3 pounds over weight in a 24-hour period of time anyway herself on a daily basis andthis, I would recommend that you take an extra dose of Lasix  40 mg. He should also have a low salt diet and try to control the amount of salt in diet your diet     Increase activity slowly    Complete by:  As directed           Current Discharge Medication List    START taking these medications   Details  furosemide (LASIX) 40 MG tablet Take 1 tablet (40 mg total) by mouth daily. Qty: 30 tablet, Refills: 0    potassium chloride SA (K-DUR,KLOR-CON) 20 MEQ tablet Take 2 tablets (40 mEq  total) by mouth 2 (two) times daily. Qty: 60 tablet, Refills: 0      CONTINUE these medications which have CHANGED   Details  diltiazem (CARDIZEM CD) 180 MG 24 hr capsule Take 1 capsule (180 mg total) by mouth daily. Qty: 30 capsule, Refills: 0      CONTINUE these medications which have NOT CHANGED   Details  acetaminophen (TYLENOL) 500 MG tablet Take 1,000 mg by mouth 2 (two) times daily as needed (for body pain).    atorvastatin (LIPITOR) 10 MG tablet TAKE 1 TABLET (10 MG TOTAL) BY MOUTH EVERY EVENING. Qty: 30 tablet, Refills: 6    bimatoprost (LUMIGAN) 0.01 % SOLN Place 1 drop into both eyes at bedtime.    brinzolamide (AZOPT) 1 % ophthalmic suspension Place 1 drop into both eyes 3 (three) times daily.    chlorhexidine (PERIDEX) 0.12 % solution Use as directed 15 mLs in the mouth or throat 2 (two) times daily.    Cholecalciferol (VITAMIN D PO) Take 1 tablet by mouth daily.    ELIQUIS 5 MG TABS tablet TAKE 1 TABLET (5 MG TOTAL) BY MOUTH 2 (TWO) TIMES DAILY. Qty: 60 tablet, Refills: 6    enalapril (VASOTEC) 20 MG tablet Take 20 mg by mouth daily.    esomeprazole (NEXIUM) 40 MG capsule Take 40 mg by mouth daily at 12 noon.    hydrochlorothiazide (MICROZIDE) 12.5 MG capsule Take 12.5 mg by mouth daily.    Polyethyl Glycol-Propyl Glycol (SYSTANE OP) Place 1 drop into both eyes 3 (three) times daily.    Sodium Chloride, Hypertonic, (MURO 128 OP) Place 1 drop into both eyes at bedtime.       No Known Allergies Follow-up Information    Follow up with Cristopher Peru, MD. Go on 10/12/2014.   Specialty:  Cardiology   Why:  Hospital F/U @1 :30 pm   Contact information:   [address] [place] [address] [phone]       Follow up with Gerrit Heck, MD. Schedule an appointment as soon as possible for a visit in 1 week.   Specialty:  Family Medicine   Why:  Hospital F/U    Contact information:   [place]  [address] [phone]        The results of significant diagnostics from this hospitalization (including imaging, microbiology, ancillary and laboratory) are listed below for reference.    Significant Diagnostic Studies: Dg Chest 2 View  09/23/2014   CLINICAL DATA:  Atrial fibrillation. Shortness of breath earlier today. Hypertension.  EXAM: CHEST  2 VIEW  COMPARISON:  Earlier today  FINDINGS: 1735 hours. Surgical sutures overlie the left apex. Midline trachea. Mild cardiomegaly with suggestion of enlargement of the pulmonary outflow tract. Trace left pleural fluid or thickening. Left apical pleural thickening. No pneumothorax. Mild pulmonary venous congestion is improved since earlier today. Left base airspace disease is also mildly improved.  IMPRESSION:  Cardiomegaly with improvement in mild pulmonary venous congestion.  Trace left pleural fluid or thickening with improved adjacent atelectasis.  Pulmonary outflow tract prominence, as can be seen with pulmonary arterial hypertension.   Electronically Signed   By: Abigail Miyamoto M.D.   On: 09/23/2014 17:56    Microbiology: No results found for this or any previous visit (from the past 240 hour(s)).   Labs: Basic Metabolic Panel:  Recent Labs Lab 09/23/14 1719 09/25/14 0530 09/25/14 0900  NA 138 138  --   K 3.7 3.2*  --   CL 102 102  --   CO2 23 29  --   GLUCOSE 83 101*  --   BUN 13 20  --   CREATININE 0.81 0.86  --   CALCIUM 10.4 9.8  --   MG  --   --  1.8   Liver Function Tests: No results for input(s): AST, ALT, ALKPHOS, BILITOT, PROT, ALBUMIN in the last 168 hours. No results for input(s): LIPASE, AMYLASE in the last 168 hours. No results for input(s): AMMONIA in the last 168 hours. CBC:  Recent Labs Lab 09/23/14 1719  WBC 8.4  HGB 14.3  HCT 44.6  MCV 81.2  PLT 278   Cardiac Enzymes:  Recent Labs Lab 09/23/14 1719 09/24/14 1010  TROPONINI <0.03 <0.03   BNP: BNP (last 3 results)  Recent Labs  09/23/14 1719   BNP 553.4*    ProBNP (last 3 results)  Recent Labs  09/25/13 1617  PROBNP 3444.0*    CBG: No results for input(s): GLUCAP in the last 168 hours.     SignedNita Sells  [facility] 09/25/2014, 2:00 PM

## 2014-09-25 NOTE — Progress Notes (Signed)
Pt discharged home, with sister at the bedside. All paper work and discharge instructions reviewed extensively with pt. Pt follow up with cardiologist scheduled. Pt aware to make follow up with PCP within 1-2 weeks per MD. Pt agrees with all discharge paperwork. Etta Quill, RN

## 2014-09-25 NOTE — Progress Notes (Signed)
Notified Kerin Ransom PA about pt's heart rate dropping into low 50s/40s. Pt has been a-fib in the high 50s to 60s most of the night. Pt is asymptomatic. Orders to hold PO cardizem until cardiology rounds this a.m. Will continue to monitor.

## 2014-10-01 DIAGNOSIS — I509 Heart failure, unspecified: Secondary | ICD-10-CM | POA: Diagnosis not present

## 2014-10-01 DIAGNOSIS — L989 Disorder of the skin and subcutaneous tissue, unspecified: Secondary | ICD-10-CM | POA: Diagnosis not present

## 2014-10-01 DIAGNOSIS — I4891 Unspecified atrial fibrillation: Secondary | ICD-10-CM | POA: Diagnosis not present

## 2014-10-01 DIAGNOSIS — Z79899 Other long term (current) drug therapy: Secondary | ICD-10-CM | POA: Diagnosis not present

## 2014-10-01 DIAGNOSIS — H9209 Otalgia, unspecified ear: Secondary | ICD-10-CM | POA: Diagnosis not present

## 2014-10-01 DIAGNOSIS — R609 Edema, unspecified: Secondary | ICD-10-CM | POA: Diagnosis not present

## 2014-10-02 ENCOUNTER — Encounter: Payer: Self-pay | Admitting: Internal Medicine

## 2014-10-07 DIAGNOSIS — H18613 Keratoconus, stable, bilateral: Secondary | ICD-10-CM | POA: Diagnosis not present

## 2014-10-07 DIAGNOSIS — H4011X3 Primary open-angle glaucoma, severe stage: Secondary | ICD-10-CM | POA: Diagnosis not present

## 2014-10-12 ENCOUNTER — Encounter: Payer: Self-pay | Admitting: Internal Medicine

## 2014-10-12 ENCOUNTER — Ambulatory Visit: Payer: Medicare Other | Admitting: Internal Medicine

## 2014-10-12 ENCOUNTER — Ambulatory Visit (INDEPENDENT_AMBULATORY_CARE_PROVIDER_SITE_OTHER): Payer: Medicare Other | Admitting: Internal Medicine

## 2014-10-12 VITALS — BP 122/72 | HR 86 | Ht 62.0 in | Wt 131.8 lb

## 2014-10-12 DIAGNOSIS — I482 Chronic atrial fibrillation, unspecified: Secondary | ICD-10-CM

## 2014-10-12 DIAGNOSIS — I5033 Acute on chronic diastolic (congestive) heart failure: Secondary | ICD-10-CM

## 2014-10-12 DIAGNOSIS — I10 Essential (primary) hypertension: Secondary | ICD-10-CM

## 2014-10-12 DIAGNOSIS — R0789 Other chest pain: Secondary | ICD-10-CM

## 2014-10-12 MED ORDER — DILTIAZEM HCL ER COATED BEADS 240 MG PO CP24: 240.0000 mg | ORAL_CAPSULE | Freq: Every day | ORAL | Status: DC

## 2014-10-12 NOTE — Assessment & Plan Note (Signed)
Her blood pressure is fairly well controlled. Hopefully she will tolerate an increase in her cardizem.

## 2014-10-12 NOTE — Assessment & Plan Note (Signed)
Today we discussed various strategies for treatment including rhythm control and rate control. We discussed amiodarone and tikosyn. She was not interested in either of these meds. We will uptitrate her cardizem as I suspect her ventricular rate is increasing and this is leading to dyspnea. She is encouraged to increase her physical activity.

## 2014-10-12 NOTE — Progress Notes (Signed)
HPI Stephanie Simmons returns today for followup. She is a pleasant 74 yo woman with a h/o atrial tachycardia and fibrillation with a RVR. She was treated with flecainide with improvement in her symptoms. She unfortunately has had recurrent atrial fib. She has been asymptomatic and did not know that she was out of rhythm. Her flecainide was stopped. She has been hospitalized with atrial fib. She is now on a strategy of rate control. She is sedentary. She has rare palpitations, particularly with exertion.      No Known Allergies   Current Outpatient Prescriptions  Medication Sig Dispense Refill  . acetaminophen (TYLENOL) 500 MG tablet Take 1,000 mg by mouth 2 (two) times daily as needed (for body pain).    Marland Kitchen atorvastatin (LIPITOR) 10 MG tablet TAKE 1 TABLET (10 MG TOTAL) BY MOUTH EVERY EVENING. 30 tablet 6  . bimatoprost (LUMIGAN) 0.01 % SOLN Place 1 drop into both eyes at bedtime.    . brinzolamide (AZOPT) 1 % ophthalmic suspension Place 1 drop into both eyes 3 (three) times daily.    . chlorhexidine (PERIDEX) 0.12 % solution Use as directed 15 mLs in the mouth or throat 2 (two) times daily.    . Cholecalciferol (VITAMIN D PO) Take 1 tablet by mouth daily.    Marland Kitchen diltiazem (CARDIZEM CD) 180 MG 24 hr capsule Take 1 capsule (180 mg total) by mouth daily. 30 capsule 0  . ELIQUIS 5 MG TABS tablet TAKE 1 TABLET (5 MG TOTAL) BY MOUTH 2 (TWO) TIMES DAILY. 60 tablet 6  . enalapril (VASOTEC) 20 MG tablet Take 20 mg by mouth daily.    Marland Kitchen esomeprazole (NEXIUM) 40 MG capsule Take 40 mg by mouth daily at 12 noon.    . furosemide (LASIX) 40 MG tablet Take 1 tablet (40 mg total) by mouth daily. 30 tablet 0  . hydrochlorothiazide (MICROZIDE) 12.5 MG capsule Take 12.5 mg by mouth daily.    Vladimir Faster Glycol-Propyl Glycol (SYSTANE OP) Place 1 drop into both eyes 3 (three) times daily.    . potassium chloride SA (K-DUR,KLOR-CON) 20 MEQ tablet Take 2 tablets (40 mEq total) by mouth 2 (two) times daily. 60  tablet 0  . Sodium Chloride, Hypertonic, (MURO 128 OP) Place 1 drop into both eyes at bedtime.     No current facility-administered medications for this visit.     Past Medical History  Diagnosis Date  . Hypertension   . GERD (gastroesophageal reflux disease)   . Hyperlipidemia   . Chest pain     a. 04/2009 Cath: essentially nl cors, EF 65%, mod LVH.  Marland Kitchen LVH (left ventricular hypertrophy)     a. 04/2009 Echo: EF 55-65%, mild conc LVH, no reg wma, Gr 1 DD, mild MR.  . Monocytosis 04/21/2014  . Hypothyroidism     does not take medication for this any longer per patient     ROS:   All systems reviewed and negative except as noted in the HPI.   Past Surgical History  Procedure Laterality Date  . Abdominal hysterectomy    . Removal of mass- pleural       Family History  Problem Relation Age of Onset  . Other Mother     s/p PPM, died @ 2  . Heart attack Father     died @ 88.  . Other Father     enlarged heart   . Stroke Sister      History   Social History  .  Marital Status: Single    Spouse Name: N/A  . Number of Children: N/A  . Years of Education: N/A   Occupational History  . Not on file.   Social History Main Topics  . Smoking status: Former Smoker    Types: Cigarettes    Quit date: 06/26/1992  . Smokeless tobacco: Never Used  . Alcohol Use: No  . Drug Use: No  . Sexual Activity: Not on file   Other Topics Concern  . Not on file   Social History Narrative   Lives in [place] by herself.     BP 122/72 mmHg  Pulse 86  Ht 5\' 2"  (1.575 m)  Wt 131 lb 12.8 oz (59.784 kg)  BMI 24.10 kg/m2  SpO2 99%  Physical Exam:  Well appearing 74 yo woman, NAD HEENT: Unremarkable Neck:  No JVD, no thyromegally Back:  No CVA tenderness Lungs:  Clear with no wheezes HEART:  IRegular rate rhythm, no murmurs, no rubs, no clicks Abd:  soft, positive bowel sounds, no organomegally, no rebound, no guarding Ext:  2 plus pulses, no edema, no cyanosis, no  clubbing Skin:  No rashes no nodules Neuro:  CN II through XII intact, motor grossly intact  EKG - atrial fibrillation   Assess/Plan:

## 2014-10-12 NOTE — Patient Instructions (Signed)
Medication Instructions:  Your physician has recommended you make the following change in your medication:  Increase Cardizem CD to 240 mg by mouth daily   Labwork: None today  Testing/Procedures: None today  Follow-Up: Your physician recommends that you schedule a follow-up appointment in:  3 months with Dr. [name]

## 2014-10-12 NOTE — Assessment & Plan Note (Signed)
Her symptoms are non-cardiac. We discussed the warning signs and symptoms. No indication for a stress test at this time.

## 2014-10-12 NOTE — Assessment & Plan Note (Signed)
Her symptoms are stable and class 2B. I have asked her to reduce her sodium intake and increase her physical activity.

## 2014-10-14 DIAGNOSIS — L820 Inflamed seborrheic keratosis: Secondary | ICD-10-CM | POA: Diagnosis not present

## 2014-10-20 ENCOUNTER — Ambulatory Visit: Payer: Medicare Other | Admitting: Internal Medicine

## 2014-10-26 DIAGNOSIS — H4011X3 Primary open-angle glaucoma, severe stage: Secondary | ICD-10-CM | POA: Diagnosis not present

## 2014-10-26 DIAGNOSIS — Z961 Presence of intraocular lens: Secondary | ICD-10-CM | POA: Diagnosis not present

## 2014-10-31 ENCOUNTER — Telehealth: Payer: Self-pay | Admitting: Internal Medicine

## 2014-10-31 MED ORDER — FUROSEMIDE 40 MG PO TABS: 40.0000 mg | ORAL_TABLET | Freq: Every day | ORAL | Status: DC

## 2014-10-31 MED ORDER — POTASSIUM CHLORIDE CRYS ER 20 MEQ PO TBCR: 40.0000 meq | EXTENDED_RELEASE_TABLET | Freq: Two times a day (BID) | ORAL | Status: DC

## 2014-10-31 NOTE — Telephone Encounter (Signed)
refuilled lasix and KCL per pt's sister request

## 2014-11-01 ENCOUNTER — Other Ambulatory Visit: Payer: Self-pay | Admitting: Physician Assistant

## 2014-11-01 DIAGNOSIS — I4892 Unspecified atrial flutter: Secondary | ICD-10-CM

## 2014-11-01 MED ORDER — ACETAMINOPHEN 500 MG PO TABS: 1000.0000 mg | ORAL_TABLET | Freq: Two times a day (BID) | ORAL | Status: DC | PRN

## 2014-11-01 NOTE — Telephone Encounter (Signed)
The patient stated the pharmacy was telling her they did not get the medication as sent electronically. I contacted the pharmacy by phone and was able to refill the medication.

## 2014-11-09 ENCOUNTER — Telehealth: Payer: Self-pay | Admitting: Internal Medicine

## 2014-11-09 NOTE — Telephone Encounter (Signed)
Sister Sullivan Lone calling stating she has a "few questions to ask Claiborne Billings".  Told operator she only wanted to speak with Claiborne Billings.  Do not see where we have authorization to speak with her regarding care of [facility].  She would not give any further information.  Advised will forward message to [place] to return call.

## 2014-11-09 NOTE — Telephone Encounter (Signed)
New message     Sister has some questions that she would like to ask nurse.

## 2014-11-10 DIAGNOSIS — R7989 Other specified abnormal findings of blood chemistry: Secondary | ICD-10-CM | POA: Diagnosis not present

## 2014-11-11 DIAGNOSIS — L280 Lichen simplex chronicus: Secondary | ICD-10-CM | POA: Diagnosis not present

## 2014-11-24 NOTE — Telephone Encounter (Signed)
Form for extractions filled out and faxed back to dental office in regards to blood thinner

## 2014-11-25 DIAGNOSIS — E782 Mixed hyperlipidemia: Secondary | ICD-10-CM | POA: Diagnosis not present

## 2014-11-25 DIAGNOSIS — I509 Heart failure, unspecified: Secondary | ICD-10-CM | POA: Diagnosis not present

## 2014-11-25 DIAGNOSIS — I10 Essential (primary) hypertension: Secondary | ICD-10-CM | POA: Diagnosis not present

## 2014-11-25 DIAGNOSIS — Z1389 Encounter for screening for other disorder: Secondary | ICD-10-CM | POA: Diagnosis not present

## 2014-11-25 DIAGNOSIS — I4891 Unspecified atrial fibrillation: Secondary | ICD-10-CM | POA: Diagnosis not present

## 2014-11-25 DIAGNOSIS — Q8500 Neurofibromatosis, unspecified: Secondary | ICD-10-CM | POA: Diagnosis not present

## 2014-12-04 DIAGNOSIS — I509 Heart failure, unspecified: Secondary | ICD-10-CM | POA: Diagnosis not present

## 2014-12-04 DIAGNOSIS — F329 Major depressive disorder, single episode, unspecified: Secondary | ICD-10-CM | POA: Diagnosis not present

## 2014-12-04 DIAGNOSIS — Z7901 Long term (current) use of anticoagulants: Secondary | ICD-10-CM | POA: Diagnosis not present

## 2014-12-04 DIAGNOSIS — Z9181 History of falling: Secondary | ICD-10-CM | POA: Diagnosis not present

## 2014-12-04 DIAGNOSIS — R2689 Other abnormalities of gait and mobility: Secondary | ICD-10-CM | POA: Diagnosis not present

## 2014-12-04 DIAGNOSIS — M6281 Muscle weakness (generalized): Secondary | ICD-10-CM | POA: Diagnosis not present

## 2014-12-04 DIAGNOSIS — I10 Essential (primary) hypertension: Secondary | ICD-10-CM | POA: Diagnosis not present

## 2014-12-04 DIAGNOSIS — I4891 Unspecified atrial fibrillation: Secondary | ICD-10-CM | POA: Diagnosis not present

## 2014-12-04 DIAGNOSIS — Q8500 Neurofibromatosis, unspecified: Secondary | ICD-10-CM | POA: Diagnosis not present

## 2014-12-05 ENCOUNTER — Other Ambulatory Visit: Payer: Self-pay | Admitting: Internal Medicine

## 2014-12-07 ENCOUNTER — Other Ambulatory Visit: Payer: Self-pay | Admitting: Internal Medicine

## 2014-12-08 DIAGNOSIS — Q8500 Neurofibromatosis, unspecified: Secondary | ICD-10-CM | POA: Diagnosis not present

## 2014-12-08 DIAGNOSIS — I509 Heart failure, unspecified: Secondary | ICD-10-CM | POA: Diagnosis not present

## 2014-12-08 DIAGNOSIS — I10 Essential (primary) hypertension: Secondary | ICD-10-CM | POA: Diagnosis not present

## 2014-12-08 DIAGNOSIS — I4891 Unspecified atrial fibrillation: Secondary | ICD-10-CM | POA: Diagnosis not present

## 2014-12-08 DIAGNOSIS — M6281 Muscle weakness (generalized): Secondary | ICD-10-CM | POA: Diagnosis not present

## 2014-12-08 DIAGNOSIS — F329 Major depressive disorder, single episode, unspecified: Secondary | ICD-10-CM | POA: Diagnosis not present

## 2014-12-10 DIAGNOSIS — I4891 Unspecified atrial fibrillation: Secondary | ICD-10-CM | POA: Diagnosis not present

## 2014-12-10 DIAGNOSIS — Q8500 Neurofibromatosis, unspecified: Secondary | ICD-10-CM | POA: Diagnosis not present

## 2014-12-10 DIAGNOSIS — M6281 Muscle weakness (generalized): Secondary | ICD-10-CM | POA: Diagnosis not present

## 2014-12-10 DIAGNOSIS — I10 Essential (primary) hypertension: Secondary | ICD-10-CM | POA: Diagnosis not present

## 2014-12-10 DIAGNOSIS — F329 Major depressive disorder, single episode, unspecified: Secondary | ICD-10-CM | POA: Diagnosis not present

## 2014-12-10 DIAGNOSIS — I509 Heart failure, unspecified: Secondary | ICD-10-CM | POA: Diagnosis not present

## 2014-12-11 DIAGNOSIS — Q8500 Neurofibromatosis, unspecified: Secondary | ICD-10-CM | POA: Diagnosis not present

## 2014-12-11 DIAGNOSIS — I4891 Unspecified atrial fibrillation: Secondary | ICD-10-CM | POA: Diagnosis not present

## 2014-12-11 DIAGNOSIS — I509 Heart failure, unspecified: Secondary | ICD-10-CM | POA: Diagnosis not present

## 2014-12-11 DIAGNOSIS — F329 Major depressive disorder, single episode, unspecified: Secondary | ICD-10-CM | POA: Diagnosis not present

## 2014-12-11 DIAGNOSIS — I10 Essential (primary) hypertension: Secondary | ICD-10-CM | POA: Diagnosis not present

## 2014-12-11 DIAGNOSIS — M6281 Muscle weakness (generalized): Secondary | ICD-10-CM | POA: Diagnosis not present

## 2014-12-14 DIAGNOSIS — Q8500 Neurofibromatosis, unspecified: Secondary | ICD-10-CM | POA: Diagnosis not present

## 2014-12-14 DIAGNOSIS — F329 Major depressive disorder, single episode, unspecified: Secondary | ICD-10-CM | POA: Diagnosis not present

## 2014-12-14 DIAGNOSIS — I10 Essential (primary) hypertension: Secondary | ICD-10-CM | POA: Diagnosis not present

## 2014-12-14 DIAGNOSIS — I4891 Unspecified atrial fibrillation: Secondary | ICD-10-CM | POA: Diagnosis not present

## 2014-12-14 DIAGNOSIS — M6281 Muscle weakness (generalized): Secondary | ICD-10-CM | POA: Diagnosis not present

## 2014-12-14 DIAGNOSIS — I509 Heart failure, unspecified: Secondary | ICD-10-CM | POA: Diagnosis not present

## 2014-12-16 DIAGNOSIS — Q8500 Neurofibromatosis, unspecified: Secondary | ICD-10-CM | POA: Diagnosis not present

## 2014-12-16 DIAGNOSIS — F329 Major depressive disorder, single episode, unspecified: Secondary | ICD-10-CM | POA: Diagnosis not present

## 2014-12-16 DIAGNOSIS — I509 Heart failure, unspecified: Secondary | ICD-10-CM | POA: Diagnosis not present

## 2014-12-16 DIAGNOSIS — M6281 Muscle weakness (generalized): Secondary | ICD-10-CM | POA: Diagnosis not present

## 2014-12-16 DIAGNOSIS — I10 Essential (primary) hypertension: Secondary | ICD-10-CM | POA: Diagnosis not present

## 2014-12-16 DIAGNOSIS — I4891 Unspecified atrial fibrillation: Secondary | ICD-10-CM | POA: Diagnosis not present

## 2014-12-21 DIAGNOSIS — I10 Essential (primary) hypertension: Secondary | ICD-10-CM | POA: Diagnosis not present

## 2014-12-21 DIAGNOSIS — F329 Major depressive disorder, single episode, unspecified: Secondary | ICD-10-CM | POA: Diagnosis not present

## 2014-12-21 DIAGNOSIS — M6281 Muscle weakness (generalized): Secondary | ICD-10-CM | POA: Diagnosis not present

## 2014-12-21 DIAGNOSIS — I4891 Unspecified atrial fibrillation: Secondary | ICD-10-CM | POA: Diagnosis not present

## 2014-12-21 DIAGNOSIS — Q8500 Neurofibromatosis, unspecified: Secondary | ICD-10-CM | POA: Diagnosis not present

## 2014-12-21 DIAGNOSIS — I509 Heart failure, unspecified: Secondary | ICD-10-CM | POA: Diagnosis not present

## 2014-12-22 DIAGNOSIS — I10 Essential (primary) hypertension: Secondary | ICD-10-CM | POA: Diagnosis not present

## 2014-12-22 DIAGNOSIS — M6281 Muscle weakness (generalized): Secondary | ICD-10-CM | POA: Diagnosis not present

## 2014-12-22 DIAGNOSIS — F329 Major depressive disorder, single episode, unspecified: Secondary | ICD-10-CM | POA: Diagnosis not present

## 2014-12-22 DIAGNOSIS — I4891 Unspecified atrial fibrillation: Secondary | ICD-10-CM | POA: Diagnosis not present

## 2014-12-22 DIAGNOSIS — I509 Heart failure, unspecified: Secondary | ICD-10-CM | POA: Diagnosis not present

## 2014-12-22 DIAGNOSIS — Q8500 Neurofibromatosis, unspecified: Secondary | ICD-10-CM | POA: Diagnosis not present

## 2014-12-28 DIAGNOSIS — I4891 Unspecified atrial fibrillation: Secondary | ICD-10-CM | POA: Diagnosis not present

## 2014-12-28 DIAGNOSIS — I10 Essential (primary) hypertension: Secondary | ICD-10-CM | POA: Diagnosis not present

## 2014-12-28 DIAGNOSIS — M6281 Muscle weakness (generalized): Secondary | ICD-10-CM | POA: Diagnosis not present

## 2014-12-28 DIAGNOSIS — I509 Heart failure, unspecified: Secondary | ICD-10-CM | POA: Diagnosis not present

## 2014-12-28 DIAGNOSIS — Q8500 Neurofibromatosis, unspecified: Secondary | ICD-10-CM | POA: Diagnosis not present

## 2014-12-28 DIAGNOSIS — F329 Major depressive disorder, single episode, unspecified: Secondary | ICD-10-CM | POA: Diagnosis not present

## 2014-12-30 DIAGNOSIS — Q8500 Neurofibromatosis, unspecified: Secondary | ICD-10-CM | POA: Diagnosis not present

## 2014-12-30 DIAGNOSIS — M6281 Muscle weakness (generalized): Secondary | ICD-10-CM | POA: Diagnosis not present

## 2014-12-30 DIAGNOSIS — I509 Heart failure, unspecified: Secondary | ICD-10-CM | POA: Diagnosis not present

## 2014-12-30 DIAGNOSIS — F329 Major depressive disorder, single episode, unspecified: Secondary | ICD-10-CM | POA: Diagnosis not present

## 2014-12-30 DIAGNOSIS — I4891 Unspecified atrial fibrillation: Secondary | ICD-10-CM | POA: Diagnosis not present

## 2014-12-30 DIAGNOSIS — I10 Essential (primary) hypertension: Secondary | ICD-10-CM | POA: Diagnosis not present

## 2015-01-01 DIAGNOSIS — Q8500 Neurofibromatosis, unspecified: Secondary | ICD-10-CM | POA: Diagnosis not present

## 2015-01-01 DIAGNOSIS — I10 Essential (primary) hypertension: Secondary | ICD-10-CM | POA: Diagnosis not present

## 2015-01-01 DIAGNOSIS — I4891 Unspecified atrial fibrillation: Secondary | ICD-10-CM | POA: Diagnosis not present

## 2015-01-01 DIAGNOSIS — I509 Heart failure, unspecified: Secondary | ICD-10-CM | POA: Diagnosis not present

## 2015-01-01 DIAGNOSIS — F329 Major depressive disorder, single episode, unspecified: Secondary | ICD-10-CM | POA: Diagnosis not present

## 2015-01-01 DIAGNOSIS — M6281 Muscle weakness (generalized): Secondary | ICD-10-CM | POA: Diagnosis not present

## 2015-01-04 ENCOUNTER — Other Ambulatory Visit: Payer: Self-pay | Admitting: Physician Assistant

## 2015-01-05 ENCOUNTER — Other Ambulatory Visit: Payer: Self-pay

## 2015-01-05 MED ORDER — FUROSEMIDE 40 MG PO TABS: 40.0000 mg | ORAL_TABLET | Freq: Every day | ORAL | Status: DC

## 2015-01-06 DIAGNOSIS — L280 Lichen simplex chronicus: Secondary | ICD-10-CM | POA: Diagnosis not present

## 2015-01-08 DIAGNOSIS — I509 Heart failure, unspecified: Secondary | ICD-10-CM | POA: Diagnosis not present

## 2015-01-08 DIAGNOSIS — M6281 Muscle weakness (generalized): Secondary | ICD-10-CM | POA: Diagnosis not present

## 2015-01-08 DIAGNOSIS — I4891 Unspecified atrial fibrillation: Secondary | ICD-10-CM | POA: Diagnosis not present

## 2015-01-08 DIAGNOSIS — Q8500 Neurofibromatosis, unspecified: Secondary | ICD-10-CM | POA: Diagnosis not present

## 2015-01-08 DIAGNOSIS — I10 Essential (primary) hypertension: Secondary | ICD-10-CM | POA: Diagnosis not present

## 2015-01-08 DIAGNOSIS — F329 Major depressive disorder, single episode, unspecified: Secondary | ICD-10-CM | POA: Diagnosis not present

## 2015-01-15 DIAGNOSIS — M6281 Muscle weakness (generalized): Secondary | ICD-10-CM | POA: Diagnosis not present

## 2015-01-15 DIAGNOSIS — I10 Essential (primary) hypertension: Secondary | ICD-10-CM | POA: Diagnosis not present

## 2015-01-15 DIAGNOSIS — Q8500 Neurofibromatosis, unspecified: Secondary | ICD-10-CM | POA: Diagnosis not present

## 2015-01-15 DIAGNOSIS — I509 Heart failure, unspecified: Secondary | ICD-10-CM | POA: Diagnosis not present

## 2015-01-15 DIAGNOSIS — I4891 Unspecified atrial fibrillation: Secondary | ICD-10-CM | POA: Diagnosis not present

## 2015-01-15 DIAGNOSIS — F329 Major depressive disorder, single episode, unspecified: Secondary | ICD-10-CM | POA: Diagnosis not present

## 2015-01-19 ENCOUNTER — Ambulatory Visit: Payer: Medicare Other | Admitting: Hematology and Oncology

## 2015-01-19 ENCOUNTER — Other Ambulatory Visit: Payer: Medicare Other

## 2015-01-25 ENCOUNTER — Other Ambulatory Visit: Payer: Self-pay | Admitting: Internal Medicine

## 2015-02-08 ENCOUNTER — Other Ambulatory Visit (HOSPITAL_COMMUNITY): Payer: Self-pay | Admitting: Family Medicine

## 2015-02-08 DIAGNOSIS — Z1231 Encounter for screening mammogram for malignant neoplasm of breast: Secondary | ICD-10-CM

## 2015-02-16 ENCOUNTER — Other Ambulatory Visit: Payer: Self-pay | Admitting: Internal Medicine

## 2015-02-26 DIAGNOSIS — H4011X3 Primary open-angle glaucoma, severe stage: Secondary | ICD-10-CM | POA: Diagnosis not present

## 2015-03-10 ENCOUNTER — Ambulatory Visit (HOSPITAL_COMMUNITY): Payer: Medicare Other

## 2015-03-11 ENCOUNTER — Ambulatory Visit (HOSPITAL_COMMUNITY)
Admission: RE | Admit: 2015-03-11 | Discharge: 2015-03-11 | Disposition: A | Discharge status: Home / Self Care | Payer: Medicare Other | Source: Ambulatory Visit | Attending: Family Medicine | Admitting: Family Medicine

## 2015-03-11 ENCOUNTER — Ambulatory Visit (HOSPITAL_COMMUNITY): Payer: Medicare Other

## 2015-03-11 DIAGNOSIS — Z1231 Encounter for screening mammogram for malignant neoplasm of breast: Secondary | ICD-10-CM

## 2015-03-12 ENCOUNTER — Ambulatory Visit (HOSPITAL_COMMUNITY): Payer: Medicare Other

## 2015-03-16 ENCOUNTER — Other Ambulatory Visit: Payer: Self-pay | Admitting: Internal Medicine

## 2015-04-12 DIAGNOSIS — Z23 Encounter for immunization: Secondary | ICD-10-CM | POA: Diagnosis not present

## 2015-04-12 DIAGNOSIS — H6120 Impacted cerumen, unspecified ear: Secondary | ICD-10-CM | POA: Diagnosis not present

## 2015-04-12 DIAGNOSIS — J309 Allergic rhinitis, unspecified: Secondary | ICD-10-CM | POA: Diagnosis not present

## 2015-04-21 ENCOUNTER — Other Ambulatory Visit: Payer: Self-pay | Admitting: *Deleted

## 2015-04-27 ENCOUNTER — Encounter: Payer: Self-pay | Admitting: *Deleted

## 2015-04-27 ENCOUNTER — Other Ambulatory Visit: Payer: Self-pay | Admitting: Internal Medicine

## 2015-04-27 NOTE — Patient Outreach (Signed)
[facility]) Care Management  04/27/2015  Kailany Dinunzio 06-13-1941 [id number]   I called pt to do a HIGH RISK PATIENT screening. Mrs. Engelken wasn't initially at home but she did call me back. I explained our services and she was not sure she was interested or could benefit from them. I asked her if I could send her some information to review and she agreed to this. I told her my phone number would be attached to this and she can call me if she would like me to come visit with her.  Deloria Lair [facility] [place] [phone]

## 2015-05-18 ENCOUNTER — Other Ambulatory Visit: Payer: Self-pay | Admitting: Internal Medicine

## 2015-05-30 ENCOUNTER — Other Ambulatory Visit: Payer: Self-pay | Admitting: Internal Medicine

## 2015-05-31 ENCOUNTER — Other Ambulatory Visit: Payer: Self-pay | Admitting: *Deleted

## 2015-05-31 MED ORDER — APIXABAN 5 MG PO TABS: 5.0000 mg | ORAL_TABLET | Freq: Two times a day (BID) | ORAL | Status: DC

## 2015-06-14 ENCOUNTER — Other Ambulatory Visit: Payer: Self-pay | Admitting: Internal Medicine

## 2015-06-30 ENCOUNTER — Other Ambulatory Visit: Payer: Self-pay | Admitting: Internal Medicine

## 2015-07-12 ENCOUNTER — Other Ambulatory Visit: Payer: Self-pay | Admitting: Internal Medicine

## 2015-07-19 ENCOUNTER — Other Ambulatory Visit: Payer: Self-pay | Admitting: Internal Medicine

## 2015-07-21 DIAGNOSIS — H401132 Primary open-angle glaucoma, bilateral, moderate stage: Secondary | ICD-10-CM | POA: Diagnosis not present

## 2015-08-03 ENCOUNTER — Other Ambulatory Visit: Payer: Self-pay | Admitting: Internal Medicine

## 2015-08-09 ENCOUNTER — Other Ambulatory Visit: Payer: Self-pay | Admitting: Internal Medicine

## 2015-08-13 ENCOUNTER — Encounter: Payer: Self-pay | Admitting: Internal Medicine

## 2015-08-13 ENCOUNTER — Ambulatory Visit (INDEPENDENT_AMBULATORY_CARE_PROVIDER_SITE_OTHER): Payer: Medicare Other | Admitting: Internal Medicine

## 2015-08-13 VITALS — BP 140/70 | HR 105 | Ht 62.0 in | Wt 129.6 lb

## 2015-08-13 DIAGNOSIS — I482 Chronic atrial fibrillation, unspecified: Secondary | ICD-10-CM

## 2015-08-13 MED ORDER — POTASSIUM CHLORIDE CRYS ER 20 MEQ PO TBCR: 40.0000 meq | EXTENDED_RELEASE_TABLET | Freq: Two times a day (BID) | ORAL | Status: DC

## 2015-08-13 MED ORDER — FUROSEMIDE 40 MG PO TABS: 40.0000 mg | ORAL_TABLET | Freq: Every day | ORAL | Status: DC

## 2015-08-13 MED ORDER — APIXABAN 5 MG PO TABS: ORAL_TABLET | ORAL | Status: DC

## 2015-08-13 MED ORDER — ENALAPRIL MALEATE 20 MG PO TABS: 20.0000 mg | ORAL_TABLET | Freq: Every day | ORAL | Status: DC

## 2015-08-13 MED ORDER — METOPROLOL SUCCINATE ER 25 MG PO TB24: 25.0000 mg | ORAL_TABLET | Freq: Every day | ORAL | Status: DC

## 2015-08-13 MED ORDER — DILTIAZEM HCL ER COATED BEADS 240 MG PO CP24: ORAL_CAPSULE | ORAL | Status: DC

## 2015-08-13 NOTE — Progress Notes (Signed)
HPI Stephanie Simmons returns today for followup. She is a pleasant 75 yo woman with a h/o atrial tachycardia and fibrillation with a RVR. She was treated with flecainide with improvement in her symptoms. She unfortunately has had recurrent atrial fib. She has been asymptomatic and did not know that she was out of rhythm. Her flecainide was stopped. She has been hospitalized with atrial fib. She is now on a strategy of rate control. She is sedentary. She has rare palpitations, particularly with exertion. She has not had syncope. She has class 2 CHF.     No Known Allergies   Current Outpatient Prescriptions  Medication Sig Dispense Refill  . acetaminophen (TYLENOL) 500 MG tablet Take 2 tablets (1,000 mg total) by mouth 2 (two) times daily as needed (for body pain). 30 tablet   . atorvastatin (LIPITOR) 10 MG tablet TAKE 1 TABLET (10 MG TOTAL) BY MOUTH EVERY EVENING. 30 tablet 0  . bimatoprost (LUMIGAN) 0.01 % SOLN Place 1 drop into both eyes at bedtime.    . brinzolamide (AZOPT) 1 % ophthalmic suspension Place 1 drop into both eyes 3 (three) times daily.    . chlorhexidine (PERIDEX) 0.12 % solution Use as directed 15 mLs in the mouth or throat 2 (two) times daily.    . Cholecalciferol (VITAMIN D PO) Take 1 tablet by mouth daily.    Marland Kitchen diltiazem (CARDIZEM CD) 240 MG 24 hr capsule TAKE ONE CAPSULE BY MOUTH EVERY DAY (OVERDUE FOR OFFICE VISIT!) 30 capsule 0  . ELIQUIS 5 MG TABS tablet TAKE 1 TABLET BY MOUTH TWICE A DAY (NEEDS TO SCHEDULE OFFICE VISIT FOR FURTHER REFILLS) 15 tablet 0  . enalapril (VASOTEC) 20 MG tablet Take 20 mg by mouth daily.    Marland Kitchen esomeprazole (NEXIUM) 40 MG capsule Take 40 mg by mouth daily at 12 noon.    . furosemide (LASIX) 40 MG tablet Take 1 tablet (40 mg total) by mouth daily. 30 tablet 11  . hydrochlorothiazide (MICROZIDE) 12.5 MG capsule Take 12.5 mg by mouth daily.    Marland Kitchen KLOR-CON M20 20 MEQ tablet TAKE 2 TABLETS TWICE A DAY 120 tablet 6  . Polyethyl Glycol-Propyl  Glycol (SYSTANE OP) Place 1 drop into both eyes 3 (three) times daily.    . Sodium Chloride, Hypertonic, (MURO 128 OP) Place 1 drop into both eyes at bedtime.     No current facility-administered medications for this visit.     Past Medical History  Diagnosis Date  . Hypertension   . GERD (gastroesophageal reflux disease)   . Hyperlipidemia   . Chest pain     a. 04/2009 Cath: essentially nl cors, EF 65%, mod LVH.  Marland Kitchen LVH (left ventricular hypertrophy)     a. 04/2009 Echo: EF 55-65%, mild conc LVH, no reg wma, Gr 1 DD, mild MR.  . Monocytosis 04/21/2014  . Hypothyroidism     does not take medication for this any longer per patient     ROS:   All systems reviewed and negative except as noted in the HPI.   Past Surgical History  Procedure Laterality Date  . Abdominal hysterectomy    . Removal of mass- pleural       Family History  Problem Relation Age of Onset  . Other Mother     s/p PPM, died @ 30  . Heart attack Father     died @ 75.  . Other Father     enlarged heart   . Stroke Sister  Social History   Social History  . Marital Status: Single    Spouse Name: N/A  . Number of Children: N/A  . Years of Education: N/A   Occupational History  . Not on file.   Social History Main Topics  . Smoking status: Former Smoker    Types: Cigarettes    Quit date: 06/26/1992  . Smokeless tobacco: Never Used  . Alcohol Use: No  . Drug Use: No  . Sexual Activity: Not on file   Other Topics Concern  . Not on file   Social History Narrative   Lives in [place] by herself.     BP 140/70 mmHg  Pulse 105  Ht 5\' 2"  (1.575 m)  Wt 129 lb 9.6 oz (58.786 kg)  BMI 23.70 kg/m2  Physical Exam:  Well appearing 76 yo woman, NAD HEENT: Unremarkable Neck:  6 cm JVD, no thyromegally Back:  No CVA tenderness Lungs:  Clear with no wheezes HEART:  IRegular rate rhythm, no murmurs, no rubs, no clicks Abd:  soft, positive bowel sounds, no organomegally, no rebound, no  guarding Ext:  2 plus pulses, no edema, no cyanosis, no clubbing Skin:  No rashes no nodules Neuro:  CN II through XII intact, motor grossly intact  EKG - atrial fibrillation with a RVR   Assess/Plan: 1. Atrial fib with a RVR - her rate does not appear to be well controlled. I have recommended she start Toprol in addition to Cardizem. She will continue her Eliquis. 2. Chronic diastolic heart failure - her symptoms are class 2. She will stop her HCTZ and continue lasix. 3. HTN - her blood pressure appears to be well controlled. Will follow. 4. Chest pain - she is currently well controlled. Will follow.  Mikle Bosworth.D.

## 2015-08-13 NOTE — Patient Instructions (Signed)
Medication Instructions:  Your physician has recommended you make the following change in your medication:  1) STOP Hydrochlorothiazide 2) START Toprol 25 mg daily  Labwork: None ordered  Testing/Procedures: None ordered  Follow-Up: Your physician wants you to follow-up in: 6 months with Dr. [name]. You will receive a reminder letter in the mail two months in advance. If you don't receive a letter, please call our office to schedule the follow-up appointment.  If you need a refill on your cardiac medications before your next appointment, please call your pharmacy.  Thank you for choosing [facility]!!

## 2015-08-23 ENCOUNTER — Other Ambulatory Visit: Payer: Self-pay | Admitting: Internal Medicine

## 2015-11-03 DIAGNOSIS — H401132 Primary open-angle glaucoma, bilateral, moderate stage: Secondary | ICD-10-CM | POA: Diagnosis not present

## 2015-12-08 DIAGNOSIS — E782 Mixed hyperlipidemia: Secondary | ICD-10-CM | POA: Diagnosis not present

## 2015-12-08 DIAGNOSIS — Q8500 Neurofibromatosis, unspecified: Secondary | ICD-10-CM | POA: Diagnosis not present

## 2015-12-08 DIAGNOSIS — E042 Nontoxic multinodular goiter: Secondary | ICD-10-CM | POA: Diagnosis not present

## 2015-12-08 DIAGNOSIS — E559 Vitamin D deficiency, unspecified: Secondary | ICD-10-CM | POA: Diagnosis not present

## 2015-12-08 DIAGNOSIS — E063 Autoimmune thyroiditis: Secondary | ICD-10-CM | POA: Diagnosis not present

## 2015-12-08 DIAGNOSIS — I10 Essential (primary) hypertension: Secondary | ICD-10-CM | POA: Diagnosis not present

## 2015-12-08 DIAGNOSIS — I4891 Unspecified atrial fibrillation: Secondary | ICD-10-CM | POA: Diagnosis not present

## 2015-12-08 DIAGNOSIS — Z8679 Personal history of other diseases of the circulatory system: Secondary | ICD-10-CM | POA: Diagnosis not present

## 2015-12-08 DIAGNOSIS — Z79899 Other long term (current) drug therapy: Secondary | ICD-10-CM | POA: Diagnosis not present

## 2015-12-08 DIAGNOSIS — R799 Abnormal finding of blood chemistry, unspecified: Secondary | ICD-10-CM | POA: Diagnosis not present

## 2015-12-08 DIAGNOSIS — Z1389 Encounter for screening for other disorder: Secondary | ICD-10-CM | POA: Diagnosis not present

## 2015-12-08 DIAGNOSIS — H47019 Ischemic optic neuropathy, unspecified eye: Secondary | ICD-10-CM | POA: Diagnosis not present

## 2016-01-13 DIAGNOSIS — E049 Nontoxic goiter, unspecified: Secondary | ICD-10-CM | POA: Diagnosis not present

## 2016-01-13 DIAGNOSIS — E063 Autoimmune thyroiditis: Secondary | ICD-10-CM | POA: Diagnosis not present

## 2016-01-16 IMAGING — CR DG CHEST 1V PORT
1 series · 1 of 1 positions shown · non-contrast
Comparison: DG CHEST 2 VIEW dated 10/16/2011; DG CHEST 1V PORT dated
04/26/2009; DG CHEST 2 VIEW dated 01/04/2009

CLINICAL DATA: Syncope, hypertension

EXAM:
PORTABLE CHEST - 1 VIEW

[AP]
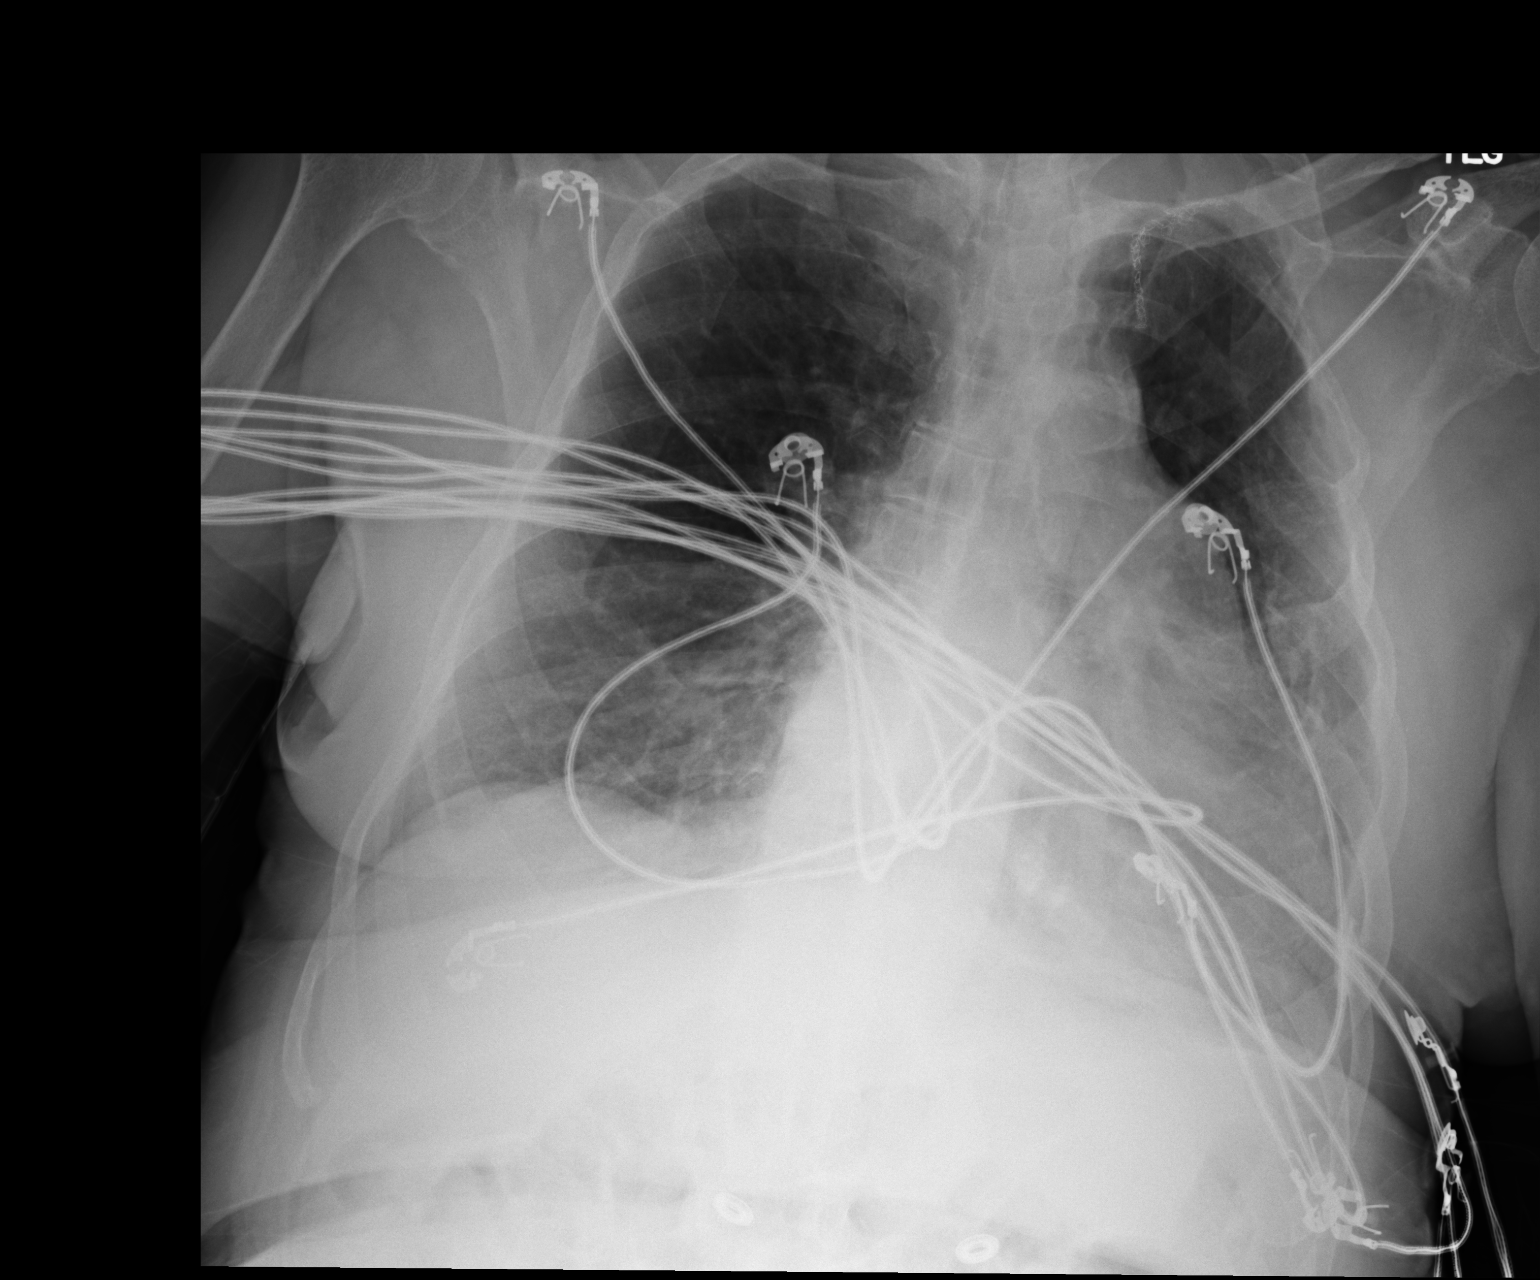

[1 of 1 positions shown; findings below may reference images not displayed]

FINDINGS: Stable mild cardiac enlargement. Vascular pattern normal. Throughout
the lungs clear. Postsurgical change left apex stable.
IMPRESSION: No active disease.

## 2016-01-31 IMAGING — CR DG CHEST 1V PORT
1 series · 1 of 1 positions shown · non-contrast
Comparison: 09/10/2013

CLINICAL DATA: Tachycardia and chest pain

EXAM:
PORTABLE CHEST - 1 VIEW

[AP]
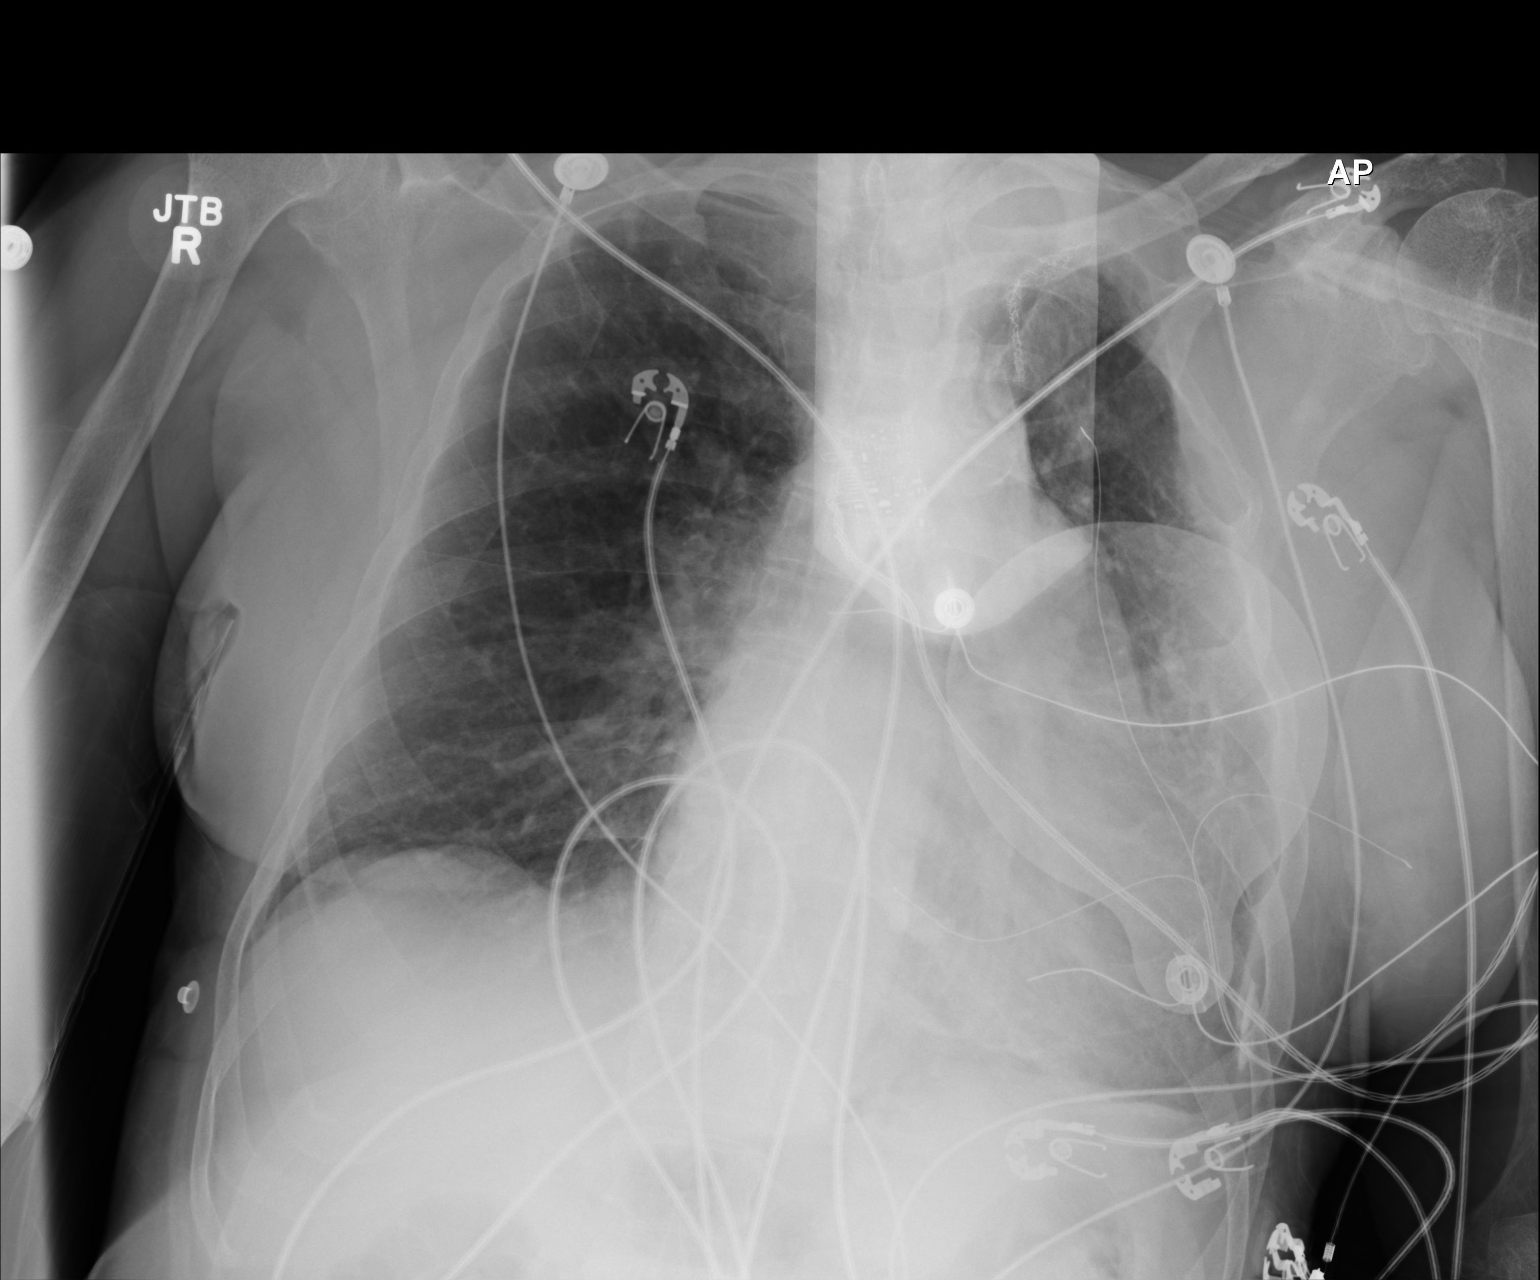

[1 of 1 positions shown; findings below may reference images not displayed]

FINDINGS: Monitoring devices are noted overlying the chest. The cardiac shadow
is mildly enlarged but stable. Postsurgical changes in the left apex
are seen. No definitive infiltrate is noted. No acute bony
abnormality is seen.
IMPRESSION: No acute abnormality noted.

## 2016-02-09 ENCOUNTER — Ambulatory Visit (INDEPENDENT_AMBULATORY_CARE_PROVIDER_SITE_OTHER): Payer: Medicare Other | Admitting: Internal Medicine

## 2016-02-09 ENCOUNTER — Encounter: Payer: Self-pay | Admitting: Internal Medicine

## 2016-02-09 VITALS — BP 124/58 | HR 95 | Ht 62.5 in | Wt 127.0 lb

## 2016-02-09 DIAGNOSIS — I471 Supraventricular tachycardia: Secondary | ICD-10-CM | POA: Diagnosis not present

## 2016-02-09 DIAGNOSIS — I4892 Unspecified atrial flutter: Secondary | ICD-10-CM

## 2016-02-09 MED ORDER — DILTIAZEM HCL ER COATED BEADS 240 MG PO CP24: ORAL_CAPSULE | ORAL | 12 refills | Status: DC

## 2016-02-09 NOTE — Progress Notes (Signed)
HPI Stephanie Simmons returns today for followup. She is a pleasant 75 yo woman with a h/o atrial tachycardia and fibrillation with a RVR. She was treated with flecainide with improvement in her symptoms. She unfortunately has had recurrent atrial fib. She has been asymptomatic and did not know that she was out of rhythm. Her flecainide was stopped.  She is now on a strategy of rate control. She is sedentary. She has rare palpitations, particularly with exertion. She has not had syncope. She has class 2 CHF. When I last saw her, we added a beta blocker to her calcium channel regimen.      No Known Allergies   Current Outpatient Prescriptions  Medication Sig Dispense Refill  . acetaminophen (TYLENOL) 500 MG tablet Take 2 tablets (1,000 mg total) by mouth 2 (two) times daily as needed (for body pain). 30 tablet   . apixaban (ELIQUIS) 5 MG TABS tablet TAKE 1 TABLET BY MOUTH TWICE A DAY 60 tablet 6  . atorvastatin (LIPITOR) 10 MG tablet TAKE 1 TABLET (10 MG TOTAL) BY MOUTH EVERY EVENING. 30 tablet 10  . bimatoprost (LUMIGAN) 0.01 % SOLN Place 1 drop into both eyes at bedtime.    . brinzolamide (AZOPT) 1 % ophthalmic suspension Place 1 drop into both eyes 3 (three) times daily.    . chlorhexidine (PERIDEX) 0.12 % solution Use as directed 15 mLs in the mouth or throat 2 (two) times daily.    . Cholecalciferol (VITAMIN D PO) Take 1 tablet by mouth daily.    Marland Kitchen diltiazem (CARDIZEM CD) 240 MG 24 hr capsule TAKE ONE CAPSULE BY MOUTH EVERY DAY (OVERDUE FOR OFFICE VISIT!) 30 capsule 6  . enalapril (VASOTEC) 20 MG tablet Take 1 tablet (20 mg total) by mouth daily. 30 tablet 6  . esomeprazole (NEXIUM) 40 MG capsule Take 40 mg by mouth daily at 12 noon.    . furosemide (LASIX) 40 MG tablet Take 1 tablet (40 mg total) by mouth daily. 30 tablet 6  . metoprolol succinate (TOPROL-XL) 25 MG 24 hr tablet Take 1 tablet (25 mg total) by mouth daily. Take with or immediately following a meal. 30 tablet 6  .  Polyethyl Glycol-Propyl Glycol (SYSTANE OP) Place 1 drop into both eyes 3 (three) times daily.    . potassium chloride SA (KLOR-CON M20) 20 MEQ tablet Take 2 tablets (40 mEq total) by mouth 2 (two) times daily. 120 tablet 6  . Sodium Chloride, Hypertonic, (MURO 128 OP) Place 1 drop into both eyes at bedtime.     No current facility-administered medications for this visit.      Past Medical History:  Diagnosis Date  . Chest pain    a. 04/2009 Cath: essentially nl cors, EF 65%, mod LVH.  Marland Kitchen GERD (gastroesophageal reflux disease)   . Hyperlipidemia   . Hypertension   . Hypothyroidism    does not take medication for this any longer per patient   . LVH (left ventricular hypertrophy)    a. 04/2009 Echo: EF 55-65%, mild conc LVH, no reg wma, Gr 1 DD, mild MR.  . Monocytosis 04/21/2014    ROS:   All systems reviewed and negative except as noted in the HPI.   Past Surgical History:  Procedure Laterality Date  . ABDOMINAL HYSTERECTOMY    . removal of mass- pleural       Family History  Problem Relation Age of Onset  . Other Mother     s/p PPM, died @  67  . Heart attack Father     died @ 43.  . Other Father     enlarged heart   . Stroke Sister      Social History   Social History  . Marital status: Single    Spouse name: N/A  . Number of children: N/A  . Years of education: N/A   Occupational History  . Not on file.   Social History Main Topics  . Smoking status: Former Smoker    Types: Cigarettes    Quit date: 06/26/1992  . Smokeless tobacco: Never Used  . Alcohol use No  . Drug use: No  . Sexual activity: Not on file   Other Topics Concern  . Not on file   Social History Narrative   Lives in [place] by herself.     BP (!) 124/58   Pulse 95   Ht 5' 2.5" (1.588 m)   Wt 127 lb (57.6 kg)   BMI 22.86 kg/m   Physical Exam:  Well appearing 75 yo woman, NAD HEENT: Unremarkable Neck:  6 cm JVD, no thyromegally Back:  No CVA tenderness Lungs:  Clear with  no wheezes HEART:  IRegular rate rhythm, no murmurs, no rubs, no clicks Abd:  soft, positive bowel sounds, no organomegally, no rebound, no guarding Ext:  2 plus pulses, no edema, no cyanosis, no clubbing Skin:  No rashes no nodules Neuro:  CN II through XII intact, motor grossly intact  EKG - atrial fibrillation with a CVR   Assess/Plan: 1. Atrial fib with a RVR - her rate does not appear to be well controlled. I have recommended she start Toprol in addition to Cardizem. She will continue her Eliquis. 2. Chronic diastolic heart failure - her symptoms are class 2. She will continue her current meds 3. HTN - her blood pressure appears to be well controlled. Will follow. 4. Chest pain - she is currently well controlled. Will follow.  Mikle Bosworth.D.

## 2016-02-09 NOTE — Patient Instructions (Signed)
Medication Instructions:  Your physician recommends that you continue on your current medications as directed. Please refer to the Current Medication list given to you today.   Your Diltiazem has been refilled today  Labwork: None ordered  Testing/Procedures: None ordered  Follow-Up: Your physician wants you to follow-up in: 1 year with Dr.Taylor You will receive a reminder letter in the mail two months in advance. If you don't receive a letter, please call our office to schedule the follow-up appointment.   Any Other Special Instructions Will Be Listed Below (If Applicable).     If you need a refill on your cardiac medications before your next appointment, please call your pharmacy.

## 2016-02-15 DIAGNOSIS — H401132 Primary open-angle glaucoma, bilateral, moderate stage: Secondary | ICD-10-CM | POA: Diagnosis not present

## 2016-02-17 ENCOUNTER — Other Ambulatory Visit: Payer: Self-pay | Admitting: Family Medicine

## 2016-02-17 DIAGNOSIS — Z1231 Encounter for screening mammogram for malignant neoplasm of breast: Secondary | ICD-10-CM

## 2016-03-14 ENCOUNTER — Ambulatory Visit: Payer: Medicare Other

## 2016-03-20 ENCOUNTER — Other Ambulatory Visit: Payer: Self-pay | Admitting: Internal Medicine

## 2016-03-21 ENCOUNTER — Ambulatory Visit
Admission: RE | Admit: 2016-03-21 | Discharge: 2016-03-21 | Disposition: A | Discharge status: Home / Self Care | Payer: Medicare Other | Source: Ambulatory Visit | Attending: Family Medicine | Admitting: Family Medicine

## 2016-03-21 DIAGNOSIS — Z1231 Encounter for screening mammogram for malignant neoplasm of breast: Secondary | ICD-10-CM | POA: Diagnosis not present

## 2016-03-30 DIAGNOSIS — Z23 Encounter for immunization: Secondary | ICD-10-CM | POA: Diagnosis not present

## 2016-04-01 ENCOUNTER — Other Ambulatory Visit: Payer: Self-pay | Admitting: Internal Medicine

## 2016-04-01 DIAGNOSIS — I482 Chronic atrial fibrillation, unspecified: Secondary | ICD-10-CM

## 2016-04-24 ENCOUNTER — Other Ambulatory Visit: Payer: Self-pay | Admitting: Internal Medicine

## 2016-05-10 DIAGNOSIS — H401133 Primary open-angle glaucoma, bilateral, severe stage: Secondary | ICD-10-CM | POA: Diagnosis not present

## 2016-07-17 DIAGNOSIS — H401133 Primary open-angle glaucoma, bilateral, severe stage: Secondary | ICD-10-CM | POA: Diagnosis not present

## 2016-07-27 ENCOUNTER — Encounter (HOSPITAL_COMMUNITY): Payer: Self-pay

## 2016-07-27 ENCOUNTER — Emergency Department (HOSPITAL_COMMUNITY)
Admission: EM | Admit: 2016-07-27 | Discharge: 2016-07-27 | Disposition: A | Discharge status: Home / Self Care | Payer: Medicare Other | Attending: Emergency Medicine | Admitting: Emergency Medicine

## 2016-07-27 ENCOUNTER — Emergency Department (HOSPITAL_COMMUNITY): Payer: Medicare Other

## 2016-07-27 DIAGNOSIS — E042 Nontoxic multinodular goiter: Secondary | ICD-10-CM | POA: Diagnosis not present

## 2016-07-27 DIAGNOSIS — Z87891 Personal history of nicotine dependence: Secondary | ICD-10-CM | POA: Diagnosis not present

## 2016-07-27 DIAGNOSIS — R55 Syncope and collapse: Secondary | ICD-10-CM | POA: Diagnosis not present

## 2016-07-27 DIAGNOSIS — R001 Bradycardia, unspecified: Secondary | ICD-10-CM | POA: Diagnosis not present

## 2016-07-27 DIAGNOSIS — I482 Chronic atrial fibrillation, unspecified: Secondary | ICD-10-CM

## 2016-07-27 DIAGNOSIS — E049 Nontoxic goiter, unspecified: Secondary | ICD-10-CM | POA: Diagnosis not present

## 2016-07-27 DIAGNOSIS — E039 Hypothyroidism, unspecified: Secondary | ICD-10-CM | POA: Insufficient documentation

## 2016-07-27 DIAGNOSIS — R404 Transient alteration of awareness: Secondary | ICD-10-CM | POA: Diagnosis not present

## 2016-07-27 DIAGNOSIS — E782 Mixed hyperlipidemia: Secondary | ICD-10-CM | POA: Diagnosis not present

## 2016-07-27 DIAGNOSIS — I10 Essential (primary) hypertension: Secondary | ICD-10-CM | POA: Diagnosis not present

## 2016-07-27 DIAGNOSIS — Z79899 Other long term (current) drug therapy: Secondary | ICD-10-CM | POA: Insufficient documentation

## 2016-07-27 DIAGNOSIS — I509 Heart failure, unspecified: Secondary | ICD-10-CM | POA: Diagnosis not present

## 2016-07-27 DIAGNOSIS — R2681 Unsteadiness on feet: Secondary | ICD-10-CM | POA: Diagnosis not present

## 2016-07-27 DIAGNOSIS — I4891 Unspecified atrial fibrillation: Secondary | ICD-10-CM | POA: Diagnosis not present

## 2016-07-27 DIAGNOSIS — I110 Hypertensive heart disease with heart failure: Secondary | ICD-10-CM | POA: Diagnosis not present

## 2016-07-27 DIAGNOSIS — E559 Vitamin D deficiency, unspecified: Secondary | ICD-10-CM | POA: Diagnosis not present

## 2016-07-27 DIAGNOSIS — I5033 Acute on chronic diastolic (congestive) heart failure: Secondary | ICD-10-CM | POA: Diagnosis not present

## 2016-07-27 DIAGNOSIS — R079 Chest pain, unspecified: Secondary | ICD-10-CM | POA: Diagnosis not present

## 2016-07-27 DIAGNOSIS — Z7901 Long term (current) use of anticoagulants: Secondary | ICD-10-CM | POA: Diagnosis not present

## 2016-07-27 DIAGNOSIS — R42 Dizziness and giddiness: Secondary | ICD-10-CM | POA: Diagnosis not present

## 2016-07-27 DIAGNOSIS — R634 Abnormal weight loss: Secondary | ICD-10-CM | POA: Diagnosis not present

## 2016-07-27 DIAGNOSIS — I499 Cardiac arrhythmia, unspecified: Secondary | ICD-10-CM | POA: Diagnosis present

## 2016-07-27 LAB — I-STAT TROPONIN, ED
TROPONIN I, POC: 0 ng/mL (ref 0.00–0.08)
Troponin i, poc: 0.01 ng/mL (ref 0.00–0.08)

## 2016-07-27 LAB — CBC WITH DIFFERENTIAL/PLATELET
Basophils Absolute: 0.1 10*3/uL (ref 0.0–0.1)
Basophils Relative: 1 %
EOS ABS: 0.1 10*3/uL (ref 0.0–0.7)
Eosinophils Relative: 2 %
HCT: 42.5 % (ref 36.0–46.0)
HEMOGLOBIN: 13.2 g/dL (ref 12.0–15.0)
LYMPHS ABS: 1.9 10*3/uL (ref 0.7–4.0)
Lymphocytes Relative: 26 %
MCH: 25.6 pg — ABNORMAL LOW (ref 26.0–34.0)
MCHC: 31.1 g/dL (ref 30.0–36.0)
MCV: 82.5 fL (ref 78.0–100.0)
MONO ABS: 0.9 10*3/uL (ref 0.1–1.0)
MONOS PCT: 12 %
Neutro Abs: 4.5 10*3/uL (ref 1.7–7.7)
Neutrophils Relative %: 59 %
Platelets: 262 10*3/uL (ref 150–400)
RBC: 5.15 MIL/uL — ABNORMAL HIGH (ref 3.87–5.11)
RDW: 14.8 % (ref 11.5–15.5)
WBC: 7.5 10*3/uL (ref 4.0–10.5)

## 2016-07-27 LAB — BASIC METABOLIC PANEL
Anion gap: 8 (ref 5–15)
BUN: 13 mg/dL (ref 6–20)
CALCIUM: 10.2 mg/dL (ref 8.9–10.3)
CHLORIDE: 107 mmol/L (ref 101–111)
CO2: 25 mmol/L (ref 22–32)
CREATININE: 0.76 mg/dL (ref 0.44–1.00)
GFR calc Af Amer: >60 mL/min (ref >60)
GFR calc non Af Amer: >60 mL/min (ref >60)
Glucose, Bld: 98 mg/dL (ref 65–99)
Potassium: 4.1 mmol/L (ref 3.5–5.1)
SODIUM: 140 mmol/L (ref 135–145)

## 2016-07-27 LAB — BRAIN NATRIURETIC PEPTIDE: B Natriuretic Peptide: 399.6 pg/mL — ABNORMAL HIGH (ref 0.0–100.0)

## 2016-07-27 MED ORDER — FUROSEMIDE 40 MG PO TABS: 60.0000 mg | ORAL_TABLET | Freq: Every day | ORAL | 10 refills | Status: DC

## 2016-07-27 NOTE — ED Notes (Signed)
Patient transported to X-ray 

## 2016-07-27 NOTE — ED Provider Notes (Signed)
[facility] DEPT Provider Note   CSN: [id number] Arrival date & time: 07/27/16  1725     History   Chief Complaint Chief Complaint  Patient presents with  . Irregular Heart Beat    HPI Stephanie Simmons is a 76 y.o. female.  HPI 76 year old female with a history of hypertension, hyperlipidemia, hypothyroidism presenting with intermittent chest pain and lightheadedness. She was seen in her PCPs office today and he noted inverted T waves in V5 and V6 on EKG and sent her here for further evaluation. The patient is a very poor historian. She states that for the past few months she has had intermittent dull chest pain that is not associated with nausea, shortness of breath, diaphoresis and does not radiate. She states that she is noticed it mostly in the morning and it resolves after taking Tylenol. She states that she does not get this pain every morning. She denies pleuritic chest pain, cough, fevers. She states that she has fallen twice in the past several months with the last being on Sunday. She denies any loss of consciousness and denies hitting her head. She is not had any headaches, neck pain, numbness, focal weakness. She states that she has intermittent episodes of lightheadedness.  Past Medical History:  Diagnosis Date  . Chest pain    a. 04/2009 Cath: essentially nl cors, EF 65%, mod LVH.  Marland Kitchen GERD (gastroesophageal reflux disease)   . Hyperlipidemia   . Hypertension   . Hypothyroidism    does not take medication for this any longer per patient   . LVH (left ventricular hypertrophy)    a. 04/2009 Echo: EF 55-65%, mild conc LVH, no reg wma, Gr 1 DD, mild MR.  . Monocytosis 04/21/2014    Patient Active Problem List   Diagnosis Date Noted  . Chest pain, atypical 09/23/2014  . Acute on chronic diastolic CHF (congestive heart failure), NYHA class 3 ([place]) 09/23/2014  . Acute diastolic heart failure ([place]) 09/23/2014  . Monocytosis 04/21/2014  . Neurofibromatosis ([place])  04/21/2014  . Poor oral hygiene 04/21/2014  . Atrial fibrillation ([place]) 04/15/2014  . PSVT (paroxysmal supraventricular tachycardia) ([place]) 09/25/2013  . Syncope 09/10/2013  . Atrial flutter ([place]) 09/10/2013  . HTN (hypertension) 09/10/2013    Past Surgical History:  Procedure Laterality Date  . ABDOMINAL HYSTERECTOMY    . removal of mass- pleural      OB History    No data available       Home Medications    Prior to Admission medications   Medication Sig Start Date End Date Taking? Authorizing Provider  acetaminophen (TYLENOL) 500 MG tablet Take 2 tablets (1,000 mg total) by mouth 2 (two) times daily as needed (for body pain). 11/01/14  Yes Rhonda G Barrett, PA-C  atorvastatin (LIPITOR) 10 MG tablet TAKE 1 TABLET (10 MG TOTAL) BY MOUTH EVERY EVENING. 08/25/15  Yes Evans Lance, MD  bimatoprost (LUMIGAN) 0.01 % SOLN Place 1 drop into both eyes at bedtime.   Yes Historical Provider, MD  brinzolamide (AZOPT) 1 % ophthalmic suspension Place 1 drop into both eyes 3 (three) times daily.   Yes Historical Provider, MD  chlorhexidine (PERIDEX) 0.12 % solution Use as directed 15 mLs in the mouth or throat 2 (two) times daily.   Yes Historical Provider, MD  diltiazem (CARDIZEM CD) 240 MG 24 hr capsule TAKE ONE CAPSULE BY MOUTH EVERY DAY (OVERDUE FOR OFFICE VISIT!) Patient taking differently: Take 240 mg by mouth daily.  02/09/16  Yes Champ Mungo  [name], MD  ELIQUIS 5 MG TABS tablet TAKE 1 TABLET BY MOUTH TWICE A DAY 04/03/16  Yes Evans Lance, MD  enalapril (VASOTEC) 20 MG tablet Take 1 tablet (20 mg total) by mouth daily. Patient taking differently: Take 20 mg by mouth 2 (two) times daily.  08/13/15  Yes Evans Lance, MD  Esomeprazole Magnesium (NEXIUM 24HR) 20 MG TBEC Take 20 mg by mouth daily before breakfast.   Yes Historical Provider, MD  fluticasone (FLONASE ALLERGY RELIEF) 50 MCG/ACT nasal spray Place 1 spray into both nostrils daily as needed for allergies or rhinitis.   Yes Historical  Provider, MD  KLOR-CON M20 20 MEQ tablet TAKE 2 TABLETS (40 MEQ TOTAL) BY MOUTH 2 (TWO) TIMES DAILY. 04/24/16  Yes Evans Lance, MD  metoprolol succinate (TOPROL-XL) 25 MG 24 hr tablet TAKE 1 TABLET (25 MG TOTAL) BY MOUTH DAILY. TAKE WITH OR IMMEDIATELY FOLLOWING A MEAL. 03/21/16  Yes Evans Lance, MD  Polyethyl Glycol-Propyl Glycol (SYSTANE OP) 1 drop See admin instructions. "Once a day into affected eye(s)"   Yes Historical Provider, MD  Sodium Chloride, Hypertonic, (MURO 128 OP) 1 drop See admin instructions. "Into affected eye(s) every hours as needed"   Yes Historical Provider, MD  diltiazem (CARDIZEM CD) 240 MG 24 hr capsule Take 1 capsule (240 mg total) by mouth daily. Patient not taking: Reported on 07/27/2016 03/21/16   Evans Lance, MD  furosemide (LASIX) 40 MG tablet Take 1.5 tablets (60 mg total) by mouth daily. 07/27/16   Kaetlin Bullen Mali Shamere Campas, MD    Family History Family History  Problem Relation Age of Onset  . Other Mother     s/p PPM, died @ 58  . Heart attack Father     died @ 15.  . Other Father     enlarged heart   . Stroke Sister     Social History Social History  Substance Use Topics  . Smoking status: Former Smoker    Types: Cigarettes    Quit date: 06/26/1992  . Smokeless tobacco: Never Used  . Alcohol use No     Allergies   Patient has no known allergies.   Review of Systems Review of Systems  Constitutional: Negative for chills and fever.  HENT: Negative for ear pain and sore throat.   Eyes: Negative for pain and visual disturbance.  Respiratory: Negative for cough and shortness of breath.   Cardiovascular: Positive for chest pain. Negative for palpitations and leg swelling.  Gastrointestinal: Negative for abdominal pain, constipation, diarrhea and vomiting.  Genitourinary: Negative for dysuria and hematuria.  Musculoskeletal: Negative for arthralgias, back pain and neck pain.  Skin: Negative for color change and rash.  Neurological: Positive for  light-headedness. Negative for seizures, syncope, weakness and numbness.  All other systems reviewed and are negative.    Physical Exam Updated Vital Signs BP 163/84 (BP Location: Right Arm)   Pulse 71   Temp 97.7 F (36.5 C) (Oral)   Resp 13   Ht 5\' 2"  (1.575 m)   Wt 55.3 kg   SpO2 100%   BMI 22.31 kg/m   Physical Exam  Constitutional: She is oriented to person, place, and time. She appears cachectic. No distress.  HENT:  Head: Normocephalic and atraumatic.  Eyes: Conjunctivae are normal.  Neck: Normal range of motion. Neck supple.  Cardiovascular: Normal rate, S1 normal, S2 normal, normal heart sounds, intact distal pulses and normal pulses.  An irregularly irregular rhythm present.  No murmur heard.  Pulmonary/Chest: Effort normal and breath sounds normal. No respiratory distress. She has no decreased breath sounds. She has no rhonchi.  Abdominal: Soft. She exhibits no distension. There is no tenderness.  Musculoskeletal: She exhibits no edema.  Neurological: She is alert and oriented to person, place, and time. She has normal strength. No cranial nerve deficit or sensory deficit.  Skin: Skin is warm and dry. Capillary refill takes less than 2 seconds.  Psychiatric: She has a normal mood and affect.  Nursing note and vitals reviewed.    ED Treatments / Results  Labs (all labs ordered are listed, but only abnormal results are displayed) Labs Reviewed  CBC WITH DIFFERENTIAL/PLATELET - Abnormal; Notable for the following:       Result Value   RBC 5.15 (*)    MCH 25.6 (*)    All other components within normal limits  BRAIN NATRIURETIC PEPTIDE - Abnormal; Notable for the following:    B Natriuretic Peptide 399.6 (*)    All other components within normal limits  BASIC METABOLIC PANEL  I-STAT TROPOININ, ED  Randolm Idol, ED    EKG  EKG Interpretation None       Radiology Dg Chest 2 View  Result Date: 07/27/2016 CLINICAL DATA:  Initial evaluation for acute  central chest pain. EXAM: CHEST  2 VIEW COMPARISON:  Prior radiograph from 09/23/2014. FINDINGS: Moderate cardiomegaly. Mediastinal silhouette within normal limits. Aortic atherosclerosis noted. Lungs are normally inflated. Pulmonary vascular congestion with interstitial prominence, suggesting pulmonary interstitial edema. No focal infiltrates identified. Blunting of the left costophrenic angle similar to prior, likely chronic. No definite pleural effusion. No pneumothorax. Suture material at the left lung apex. No acute osseous abnormality. IMPRESSION: 1. Cardiomegaly with mild diffuse pulmonary interstitial edema. 2. Aortic atherosclerosis. Electronically Signed   By: Jeannine Boga M.D.   On: 07/27/2016 19:20    Procedures Procedures (including critical care time)  Medications Ordered in ED Medications - No data to display   Initial Impression / Assessment and Plan / ED Course  I have reviewed the triage vital signs and the nursing notes.  Pertinent labs & imaging results that were available during my care of the patient were reviewed by me and considered in my medical decision making (see chart for details).    75yoF presenting with intermittent chest pain and lightheadedness. CP is atypical and relieved by tylenol. Intermittent lightheadedness with 2 falls in the past several months. No obvious deformity or trauma on exam. Has been ambulating without difficulty. Nonfocal neuro exam. No neck pain or tenderness with full ROM of neck, doubt intracranial abnormalities or cervical spine injury. Generalized weakness possibly from electrolyte abnormality, anemia, ACS, CHF. CBC, BMP, Trop, BNP, EKG, CXR ordered. EKG with slight T wave inversion in V6 when compared to prior. CXR with mild diffuse pulm edema and cardiomegaly. CBC and BMP unremarkable with no significant anemia or electrolyte ab. Trop neg. BNP 399 which is less than her previous. 3h trop negative. Doubt ACS. Possibly worsening  of congestive heart failure. Will increase Lasix to 60 mg a day and have her follow-up with her PCP for reassessment and outpatient echo. strict return precautions given patient discharged in good condition.  Patient care discussed and supervised by my attending, Dr. Vanita Panda. Drucie Ip, MD   Final Clinical Impressions(s) / ED Diagnoses   Final diagnoses:  Chronic atrial fibrillation [facility])  Near syncope    New Prescriptions Current Discharge Medication List       Yarisa Lynam Mali Nickoles Gregori, MD  07/27/16 [place], MD 07/27/16 2355

## 2016-07-27 NOTE — ED Notes (Signed)
ED Provider at bedside. 

## 2016-07-27 NOTE — ED Notes (Signed)
Lab at bedside collecting blood

## 2016-07-27 NOTE — Discharge Instructions (Signed)
Please call your PCP in the morning for a follow up visit at the next available appointment time. I have increased your lasix to 60mg  daily because you have a small amount of fluid on the chest xray and the BNP was mildly elevated. You may need a repeat echocardiogram as an outpatient to assess your heart function. Please return if your symptoms worsen.

## 2016-07-27 NOTE — ED Triage Notes (Signed)
Pt BIB [facility] from PCP office where pt went in related to recent falls (one last month and one this past week). Pt also report some CP on occasion as well as some dizziness at times. MD noted inverted T waves on EKG at PCP. Pt denies pain for EMS. A&O on arrival.

## 2016-07-27 NOTE — ED Notes (Signed)
Pt did not need anything at this time  

## 2016-07-28 ENCOUNTER — Telehealth: Payer: Self-pay | Admitting: *Deleted

## 2016-07-28 ENCOUNTER — Other Ambulatory Visit (HOSPITAL_COMMUNITY): Payer: Self-pay | Admitting: *Deleted

## 2016-07-28 NOTE — Telephone Encounter (Signed)
Pt was on recent afib ED report.  I cld pt to ask that she sched for follow up with Dr. [name].  Pt stated that she scheduled with her PCP as instructed on discharge.

## 2016-07-28 NOTE — Telephone Encounter (Signed)
Pharmacy called regarding pt misplaced Rx given in ED last night.  [facility] reviewed chart and gave verbal order for lasix Rx written.

## 2016-07-31 DIAGNOSIS — Z79899 Other long term (current) drug therapy: Secondary | ICD-10-CM | POA: Diagnosis not present

## 2016-07-31 DIAGNOSIS — R2681 Unsteadiness on feet: Secondary | ICD-10-CM | POA: Diagnosis not present

## 2016-07-31 DIAGNOSIS — I509 Heart failure, unspecified: Secondary | ICD-10-CM | POA: Diagnosis not present

## 2016-07-31 DIAGNOSIS — I4891 Unspecified atrial fibrillation: Secondary | ICD-10-CM | POA: Diagnosis not present

## 2016-08-01 ENCOUNTER — Telehealth: Payer: Self-pay

## 2016-08-01 NOTE — Telephone Encounter (Signed)
SENT NOTES TO SCHEDULING 

## 2016-08-02 ENCOUNTER — Telehealth: Payer: Self-pay

## 2016-08-07 ENCOUNTER — Ambulatory Visit: Payer: Medicare Other | Attending: Family Medicine | Admitting: Physical Therapy

## 2016-08-07 DIAGNOSIS — R2689 Other abnormalities of gait and mobility: Secondary | ICD-10-CM | POA: Insufficient documentation

## 2016-08-07 DIAGNOSIS — R2681 Unsteadiness on feet: Secondary | ICD-10-CM | POA: Diagnosis not present

## 2016-08-07 DIAGNOSIS — M6281 Muscle weakness (generalized): Secondary | ICD-10-CM | POA: Diagnosis not present

## 2016-08-08 ENCOUNTER — Encounter: Payer: Self-pay | Admitting: Physical Therapy

## 2016-08-08 NOTE — Therapy (Signed)
[facility] [address] [place], [place], [address] Phone: [phone]   Fax:  [phone]  Physical Therapy Evaluation  Patient Details  Name: Stephanie Simmons MRN: [id number] Date of Birth: 1940-10-29 Referring Provider: Dr. Leighton Ruff  Encounter Date: 08/07/2016      PT End of Session - 08/08/16 2041    Visit Number 1   Number of Visits 9   Date for PT Re-Evaluation 09/06/16   Authorization Type Medicare   PT Start Time 1316   PT Stop Time 1400   PT Time Calculation (min) 44 min      Past Medical History:  Diagnosis Date  . Chest pain    a. 04/2009 Cath: essentially nl cors, EF 65%, mod LVH.  Marland Kitchen GERD (gastroesophageal reflux disease)   . Hyperlipidemia   . Hypertension   . Hypothyroidism    does not take medication for this any longer per patient   . LVH (left ventricular hypertrophy)    a. 04/2009 Echo: EF 55-65%, mild conc LVH, no reg wma, Gr 1 DD, mild MR.  . Monocytosis 04/21/2014    Past Surgical History:  Procedure Laterality Date  . ABDOMINAL HYSTERECTOMY    . removal of mass- pleural      There were no vitals filed for this visit.       Subjective Assessment - 08/08/16 2022    Subjective Pt reports she has been unsteady for approx. the past year - does not use assistive device; Sister reports pt is very inactive - stays in bed alot during the day   Patient is accompained by: Family member   Pertinent History PSVT, atrial fibrillation, HTN, syncope, CHF, chest pain, atypical, atrial flutter   Patient Stated Goals walk more steady, improve balance   Currently in Pain? No/denies            [facility] PT Assessment - 08/08/16 0001      Assessment   Medical Diagnosis Unsteady Gait   Referring Provider Dr. Leighton Ruff   Onset Date/Surgical Date --  approx. 6 months ago   Prior Therapy --  approx. 10 years ago     Precautions   Precautions Fall     Restrictions   Weight  Bearing Restrictions No     Balance Screen   Has the patient fallen in the past 6 months Yes   How many times? 2     [place] residence   Type of [place] to enter   Entrance Stairs-Number of Steps 1  1 small step   Home Layout One level     Prior Function   Level of Independence Independent     ROM / Strength   AROM / PROM / Strength Strength     Strength   Strength Assessment Site Hip;Knee;Ankle   Right/Left Hip Right;Left   Right Hip Flexion 4-/5   Right Hip Extension 2-/5   Right Hip ABduction 4-/5   Left Hip Flexion 4-/5   Left Hip Extension 2-/5   Left Hip ABduction 4-/5   Right/Left Knee Right;Left   Right Knee Flexion 3+/5   Right Knee Extension 4+/5   Left Knee Flexion 3+/5   Left Knee Extension 4+/5     Transfers   Transfers Sit to Stand   Number of Reps Other reps (comment)  1   Comments able to perform without UE support with much momentum  Ambulation/Gait   Ambulation/Gait Yes   Ambulation/Gait Assistance 5: Supervision   Ambulation Distance (Feet) 75 Feet   Assistive device None   Gait velocity 11.56 secs= 2.84     Standardized Balance Assessment   Standardized Balance Assessment Timed Up and Go Test     Timed Up and Go Test   Normal TUG (seconds) 14.75  no device               Self Care; discussed benefits of use of assistive device to assist in decreasing fall risk by increasing stability with ambulation (with pt and her sister) Also discussed recommendation for walking program - with plan to explore options in community for indoor walking tracks or places to walk                 PT Long Term Goals - 08/08/16 2100      PT LONG TERM GOAL #1   Title Improve TUG score from 14.75 secs to </= 12.5 secs with no device for reduced fall risk.  (09-06-16)   Baseline 14.75 secs with no device   Time 4   Period Weeks   Status New     PT LONG TERM GOAL #2    Title Incr. gait velocity to >/= 3.2 ft/sec with no device for incr. gait efficiency.  (09-06-16)   Baseline 2.84 ft/sec = 11.56 secs    Time 4   Period Weeks   Status New     PT LONG TERM GOAL #3   Title Pt will demonstrate increased strength in bil. LE's so she is able to perform sit to stand without UE support 5 times from 18" mat.  (09-06-16)   Baseline 1 time with much momentum used with pt stabilizing against mat table   Time 4   Period Weeks   Status New     PT LONG TERM GOAL #4   Title Amb. 240' with single point cane with SBA without LOB on flat, even surface.  (09-06-16)   Baseline 34' with no device   Time 4   Period Weeks   Status New     PT LONG TERM GOAL #5   Title Independent in HEP for strengthening and balance exercises.   Time 4   Period Weeks   Status New               Plan - 08/08/16 2042    Clinical Impression Statement Pt is a 76 year old lady with gait and balance deficits and decreased endurance.  Pt's PMH includes CHF, h/o chest pain, HTN, osteopenia, SVT, and atrial fibrillation.  Pt presents with unsteady gait and balance deficits but is not using an assistive device. Moderate decision making required in POC.                       Rehab Potential Good   PT Frequency 2x / week   PT Duration 4 weeks   PT Treatment/Interventions ADLs/Self Care Home Management;Functional mobility training;Stair training;Gait training;DME Instruction;Therapeutic activities;Therapeutic exercise;Balance training;Neuromuscular re-education;Patient/family education   PT Next Visit Plan begin HEP instruction; gait train with cane/RW; Merrilee Jansky test   PT Home Exercise Plan strengthening and balance exercises   Consulted and Agree with Plan of Care Patient;Family member/caregiver   Family Member Consulted sister Sullivan Lone      Patient will benefit from skilled therapeutic intervention in order to improve the following deficits and impairments:  Abnormal gait, Cardiopulmonary  status limiting activity, Decreased  activity tolerance, Decreased balance, Decreased mobility, Decreased strength, Decreased knowledge of use of DME, Decreased endurance, Dizziness  Visit Diagnosis: Muscle weakness (generalized) - Plan: PT plan of care cert/re-cert  Other abnormalities of gait and mobility - Plan: PT plan of care cert/re-cert  Unsteadiness on feet - Plan: PT plan of care cert/re-cert      G-Codes - [id number]    Functional Assessment Tool Used TUG score 14.75 secs with no device:  Gait velocity 11.56 secs   Functional Limitation Mobility: Walking and moving around   Mobility: Walking and Moving Around Current Status [phone]) At least 40 percent but less than 60 percent impaired, limited or restricted   Mobility: Walking and Moving Around Goal Status [phone]) At least 20 percent but less than 40 percent impaired, limited or restricted       Problem List Patient Active Problem List   Diagnosis Date Noted  . Chest pain, atypical 09/23/2014  . Acute on chronic diastolic CHF (congestive heart failure), NYHA class 3 ([place]) 09/23/2014  . Acute diastolic heart failure ([place]) 09/23/2014  . Monocytosis 04/21/2014  . Neurofibromatosis ([place]) 04/21/2014  . Poor oral hygiene 04/21/2014  . Atrial fibrillation ([place]) 04/15/2014  . PSVT (paroxysmal supraventricular tachycardia) ([place]) 09/25/2013  . Syncope 09/10/2013  . Atrial flutter ([place]) 09/10/2013  . HTN (hypertension) 09/10/2013    Sriansh Farra, [place], PT 08/08/2016, 9:18 PM  [facility] [address] [place], [place], [address] Phone: [phone]   Fax:  [phone]  Name: Stephanie Simmons MRN: [id number] Date of Birth: 27-Dec-1940

## 2016-08-11 ENCOUNTER — Ambulatory Visit (INDEPENDENT_AMBULATORY_CARE_PROVIDER_SITE_OTHER): Payer: Medicare Other | Admitting: Internal Medicine

## 2016-08-11 ENCOUNTER — Encounter: Payer: Self-pay | Admitting: Internal Medicine

## 2016-08-11 VITALS — BP 116/68 | HR 81 | Ht 62.0 in | Wt 123.0 lb

## 2016-08-11 DIAGNOSIS — I471 Supraventricular tachycardia: Secondary | ICD-10-CM

## 2016-08-11 DIAGNOSIS — I4892 Unspecified atrial flutter: Secondary | ICD-10-CM

## 2016-08-11 DIAGNOSIS — R5383 Other fatigue: Secondary | ICD-10-CM | POA: Diagnosis not present

## 2016-08-11 MED ORDER — ENALAPRIL MALEATE 10 MG PO TABS: 10.0000 mg | ORAL_TABLET | Freq: Every day | ORAL | 3 refills | Status: DC

## 2016-08-11 MED ORDER — METOPROLOL SUCCINATE ER 25 MG PO TB24: 12.5000 mg | ORAL_TABLET | Freq: Every day | ORAL | 4 refills | Status: DC

## 2016-08-11 NOTE — Patient Instructions (Addendum)
Medication Instructions:  Your physician has recommended you make the following change in your medication:  1) DECREASE Toprol Xl to 12.5 mg (1/2 tablet) daily 2) DECREASE Enalapril to 10 mg (1 tablet --NEW Rx) daily  Labwork: None Ordered   Testing/Procedures: None Ordered    Follow-Up: Your physician recommends that you schedule a follow-up appointment in: 6-8 weeks with Dr. [name]    Any Other Special Instructions Will Be Listed Below (If Applicable).     If you need a refill on your cardiac medications before your next appointment, please call your pharmacy.

## 2016-08-11 NOTE — Progress Notes (Signed)
HPI [name] returns today for followup. She is a pleasant 76 yo woman with a h/o atrial tachycardia and fibrillation with a RVR. She has developed chronic atrial fib. She is now on a strategy of rate control. She is sedentary. She has rare palpitations, particularly with exertion. She has not had syncope. She has class 2 CHF. When I last saw her, we added a beta blocker to her calcium channel regimen. Her main complaint is falling and weakness and fatigue. Her blood pressure is lower. It is unclear if she is tripping or if her blood pressure is too low or perhaps her heart rate is going too low. She denies loss of consciousness however.     No Known Allergies   Current Outpatient Prescriptions  Medication Sig Dispense Refill  . acetaminophen (TYLENOL) 500 MG tablet Take 500 mg by mouth every 6 (six) hours as needed (pain).    Marland Kitchen atorvastatin (LIPITOR) 10 MG tablet TAKE 1 TABLET (10 MG TOTAL) BY MOUTH EVERY EVENING. 30 tablet 10  . bimatoprost (LUMIGAN) 0.01 % SOLN Place 1 drop into both eyes at bedtime.    . brinzolamide (AZOPT) 1 % ophthalmic suspension Place 1 drop into both eyes 3 (three) times daily.    . chlorhexidine (PERIDEX) 0.12 % solution Use as directed 15 mLs in the mouth or throat 2 (two) times daily.    Marland Kitchen diltiazem (CARDIZEM CD) 240 MG 24 hr capsule Take 240 mg by mouth daily.    Marland Kitchen ELIQUIS 5 MG TABS tablet TAKE 1 TABLET BY MOUTH TWICE A DAY 60 tablet 9  . enalapril (VASOTEC) 20 MG tablet Take 40 mg by mouth daily.    . Esomeprazole Magnesium (NEXIUM 24HR) 20 MG TBEC Take 20 mg by mouth as directed.     . fluticasone (FLONASE ALLERGY RELIEF) 50 MCG/ACT nasal spray Place 1 spray into both nostrils daily.     . furosemide (LASIX) 40 MG tablet Take 40 mg by mouth daily.    Marland Kitchen KLOR-CON M20 20 MEQ tablet TAKE 2 TABLETS (40 MEQ TOTAL) BY MOUTH 2 (TWO) TIMES DAILY. 120 tablet 6  . metoprolol succinate (TOPROL-XL) 25 MG 24 hr tablet TAKE 1 TABLET (25 MG TOTAL) BY MOUTH DAILY.  TAKE WITH OR IMMEDIATELY FOLLOWING A MEAL. 30 tablet 10  . Polyethyl Glycol-Propyl Glycol (SYSTANE OP) 1 drop See admin instructions. Once a day into affected eye(s) as needed    . Sodium Chloride, Hypertonic, (MURO 128 OP) 1 drop See admin instructions. Into affected eye(s) every 4 hours as needed     No current facility-administered medications for this visit.      Past Medical History:  Diagnosis Date  . Chest pain    a. 04/2009 Cath: essentially nl cors, EF 65%, mod LVH.  Marland Kitchen GERD (gastroesophageal reflux disease)   . Hyperlipidemia   . Hypertension   . Hypothyroidism    does not take medication for this any longer per patient   . LVH (left ventricular hypertrophy)    a. 04/2009 Echo: EF 55-65%, mild conc LVH, no reg wma, Gr 1 DD, mild MR.  . Monocytosis 04/21/2014    ROS:   All systems reviewed and negative except as noted in the HPI.   Past Surgical History:  Procedure Laterality Date  . ABDOMINAL HYSTERECTOMY    . removal of mass- pleural       Family History  Problem Relation Age of Onset  . Other Mother  s/p PPM, died @ 21  . Heart attack Father     died @ 77.  . Other Father     enlarged heart   . Stroke Sister      Social History   Social History  . Marital status: Single    Spouse name: N/A  . Number of children: N/A  . Years of education: N/A   Occupational History  . Not on file.   Social History Main Topics  . Smoking status: Former Smoker    Types: Cigarettes    Quit date: 06/26/1992  . Smokeless tobacco: Never Used  . Alcohol use No  . Drug use: No  . Sexual activity: Not on file   Other Topics Concern  . Not on file   Social History Narrative   Lives in [place] by herself.     BP 116/68   Pulse 81   Ht 5\' 2"  (1.575 m)   Wt 123 lb (55.8 kg)   BMI 22.50 kg/m   Physical Exam:  Well appearing 76 yo woman, NAD HEENT: Unremarkable Neck:  6 cm JVD, no thyromegally Back:  No CVA tenderness Lungs:  Clear with no  wheezes HEART:  IRegular rate rhythm, no murmurs, no rubs, no clicks Abd:  soft, positive bowel sounds, no organomegally, no rebound, no guarding Ext:  2 plus pulses, no edema, no cyanosis, no clubbing Skin:  No rashes no nodules Neuro:  CN II through XII intact, motor grossly intact  EKG - atrial fibrillation with a CVR   Assess/Plan: 1. Atrial fib with a RVR - her rate appears to be well controlled.I have recommended she reduce her dose of metoprolol 2. Chronic diastolic heart failure - her symptoms are class 2. She will continue her current meds except I have asked her to reduce her dose of lasix. 3. HTN - her blood pressure appears to be well controlled. Will follow. 4. Falls/weakness - it could be her atrial fib is going too slow or less likely too fast. Could be hyptotension or she may just be getting weaker as she is quite sedentary. I discussed the placement of a loop recorder and will recommend this if she is unimproved with our initial changes to her medical regimen.    Cristopher Peru, M.D.  Mikle Bosworth.D.

## 2016-08-15 DIAGNOSIS — H401133 Primary open-angle glaucoma, bilateral, severe stage: Secondary | ICD-10-CM | POA: Diagnosis not present

## 2016-08-16 ENCOUNTER — Ambulatory Visit: Payer: Medicare Other | Admitting: Physical Therapy

## 2016-08-16 ENCOUNTER — Other Ambulatory Visit: Payer: Self-pay | Admitting: Internal Medicine

## 2016-08-16 DIAGNOSIS — R2689 Other abnormalities of gait and mobility: Secondary | ICD-10-CM | POA: Diagnosis not present

## 2016-08-16 DIAGNOSIS — R2681 Unsteadiness on feet: Secondary | ICD-10-CM | POA: Diagnosis not present

## 2016-08-16 DIAGNOSIS — M6281 Muscle weakness (generalized): Secondary | ICD-10-CM | POA: Diagnosis not present

## 2016-08-17 ENCOUNTER — Ambulatory Visit: Payer: Medicare Other | Admitting: Physical Therapy

## 2016-08-17 DIAGNOSIS — M6281 Muscle weakness (generalized): Secondary | ICD-10-CM | POA: Diagnosis not present

## 2016-08-17 DIAGNOSIS — R2681 Unsteadiness on feet: Secondary | ICD-10-CM | POA: Diagnosis not present

## 2016-08-17 DIAGNOSIS — R2689 Other abnormalities of gait and mobility: Secondary | ICD-10-CM | POA: Diagnosis not present

## 2016-08-17 NOTE — Therapy (Signed)
[facility] [address] [place], [place], [address] Phone: [phone]   Fax:  [phone]  Physical Therapy Treatment  Patient Details  Name: Stephanie Simmons MRN: [id number] Date of Birth: Dec 16, 1940 Referring Provider: Dr. Leighton Ruff  Encounter Date: 08/16/2016      PT End of Session - 08/17/16 2007    Visit Number 2   Number of Visits 9   Date for PT Re-Evaluation 09/06/16   Authorization Type Medicare   PT Start Time 1105   PT Stop Time 1151   PT Time Calculation (min) 46 min      Past Medical History:  Diagnosis Date  . Chest pain    a. 04/2009 Cath: essentially nl cors, EF 65%, mod LVH.  Marland Kitchen GERD (gastroesophageal reflux disease)   . Hyperlipidemia   . Hypertension   . Hypothyroidism    does not take medication for this any longer per patient   . LVH (left ventricular hypertrophy)    a. 04/2009 Echo: EF 55-65%, mild conc LVH, no reg wma, Gr 1 DD, mild MR.  . Monocytosis 04/21/2014    Past Surgical History:  Procedure Laterality Date  . ABDOMINAL HYSTERECTOMY    . removal of mass- pleural      There were no vitals filed for this visit.      Subjective Assessment - 08/17/16 1943    Subjective Pt reports no problems/falls since last visit   Patient is accompained by: Family member   Pertinent History PSVT, atrial fibrillation, HTN, syncope, CHF, chest pain, atypical, atrial flutter   Patient Stated Goals walk more steady, improve balance   Currently in Pain? No/denies                         [facility] Adult PT Treatment/Exercise - 08/17/16 0001      Transfers   Transfers Sit to Stand   Number of Reps 10 reps   Comments no UE support used     Ambulation/Gait   Ambulation/Gait Yes   Ambulation/Gait Assistance 4: Min guard   Ambulation Distance (Feet) 250 Feet   Assistive device None   Gait Pattern Step-through pattern;Decreased arm swing - right;Decreased arm swing -  left;Wide base of support   Ambulation Surface Level;Indoor   Gait Comments pt needs tactile and verbal cues to increase arm swing and to extend elbows to let arms relax by her side     Standardized Balance Assessment   Standardized Balance Assessment Berg Balance Test     Berg Balance Test   Sit to Stand Able to stand without using hands and stabilize independently   Standing Unsupported Able to stand safely 2 minutes   Sitting with Back Unsupported but Feet Supported on Floor or Stool Able to sit safely and securely 2 minutes   Stand to Sit Sits safely with minimal use of hands   Transfers Able to transfer safely, minor use of hands   Standing Unsupported with Eyes Closed Able to stand 10 seconds safely   Standing Ubsupported with Feet Together Able to place feet together independently and stand 1 minute safely   From Standing, Reach Forward with Outstretched Arm Can reach confidently >25 cm (10")   From Standing Position, Pick up Object from Floor Able to pick up shoe safely and easily   From Standing Position, Turn to Look Behind Over each Shoulder Turn sideways only but maintains balance   Turn 360 Degrees Needs close supervision  or verbal cueing  R=5.03  L=4.34   Standing Unsupported, Alternately Place Feet on Step/Stool Able to complete >2 steps/needs minimal assist   Standing Unsupported, One Foot in Front Able to take small step independently and hold 30 seconds   Standing on One Leg Tries to lift leg/unable to hold 3 seconds but remains standing independently   Total Score 43                PT Education - 08/17/16 2006    Education provided Yes   Education Details sit to stand, balance exercises - 3 directions for kicks, marching and sidestepping   Person(s) Educated Patient;Other (comment)  sister   Methods Explanation;Demonstration;Handout   Comprehension Verbalized understanding;Returned demonstration             PT Long Term Goals - 08/08/16 2100       PT LONG TERM GOAL #1   Title Improve TUG score from 14.75 secs to </= 12.5 secs with no device for reduced fall risk.  (09-06-16)   Baseline 14.75 secs with no device   Time 4   Period Weeks   Status New     PT LONG TERM GOAL #2   Title Incr. gait velocity to >/= 3.2 ft/sec with no device for incr. gait efficiency.  (09-06-16)   Baseline 2.84 ft/sec = 11.56 secs    Time 4   Period Weeks   Status New     PT LONG TERM GOAL #3   Title Pt will demonstrate increased strength in bil. LE's so she is able to perform sit to stand without UE support 5 times from 18" mat.  (09-06-16)   Baseline 1 time with much momentum used with pt stabilizing against mat table   Time 4   Period Weeks   Status New     PT LONG TERM GOAL #4   Title Amb. 240' with single point cane with SBA without LOB on flat, even surface.  (09-06-16)   Baseline 77' with no device   Time 4   Period Weeks   Status New     PT LONG TERM GOAL #5   Title Independent in HEP for strengthening and balance exercises.   Time 4   Period Weeks   Status New               Plan - 08/17/16 2008    Clinical Impression Statement Pt needs UE support for safety with balance due to decreased SLS on RLE and on LLE; pt holds bil. UE's flexed at elbows (partially due to habit) but able to relax with cues    Rehab Potential Good   PT Frequency 2x / week   PT Duration 4 weeks   PT Treatment/Interventions ADLs/Self Care Home Management;Functional mobility training;Stair training;Gait training;DME Instruction;Therapeutic activities;Therapeutic exercise;Balance training;Neuromuscular re-education;Patient/family education   PT Next Visit Plan check HEP - cont balance and gait   PT Home Exercise Plan strengthening and balance exercises   Consulted and Agree with Plan of Care Patient;Family member/caregiver   Family Member Consulted sister Vernice      Patient will benefit from skilled therapeutic intervention in order to improve the  following deficits and impairments:  Abnormal gait, Cardiopulmonary status limiting activity, Decreased activity tolerance, Decreased balance, Decreased mobility, Decreased strength, Decreased knowledge of use of DME, Decreased endurance, Dizziness  Visit Diagnosis: Other abnormalities of gait and mobility  Unsteadiness on feet     Problem List Patient Active Problem List   Diagnosis  Date Noted  . Chest pain, atypical 09/23/2014  . Acute on chronic diastolic CHF (congestive heart failure), NYHA class 3 ([place]) 09/23/2014  . Acute diastolic heart failure ([place]) 09/23/2014  . Monocytosis 04/21/2014  . Neurofibromatosis ([place]) 04/21/2014  . Poor oral hygiene 04/21/2014  . Atrial fibrillation ([place]) 04/15/2014  . PSVT (paroxysmal supraventricular tachycardia) ([place]) 09/25/2013  . Syncope 09/10/2013  . Atrial flutter ([place]) 09/10/2013  . HTN (hypertension) 09/10/2013    Keirstin Musil, [place], PT 08/17/2016, 8:25 PM  [facility] [address] [place] [facility] [place], [place], [address] Phone: [phone]   Fax:  [phone]  Name: Ambriah Fritchman MRN: [id number] Date of Birth: [birth year]

## 2016-08-17 NOTE — Patient Instructions (Signed)
.  Functional Quadriceps: Sit to Stand    Sit on edge of chair, feet flat on floor. Stand upright, extending knees fully. Repeat __10_ times per set. Do __1__ sets per session. Do __2__ sessions per day.  http://orth.exer.us/735   Copyright  VHI. All rights reserved.  "I love a Database administrator    Using a chair if necessary, march in place 4 times in each phase: (1) Foot raised 6" (2) 12" (3) 18" (4) as high as you can. Repeat _10___ times. Do __2__ sessions per day.  http://gt2.exer.us/345   Copyright  VHI. All rights reserved.  HIP: Abduction - Standing    Squeeze glutes. Raise leg out and slightly back. __10_ reps per set, _2__ sets per day, _5_ days per week Hold onto a support.  Copyright  VHI. All rights reserved.  Flexion and Extension - Standing    Stand and support self while swinging uninvolved leg and hip forward and backward _10__ times. Repeat with involved leg and hip. Do  2__ times per day.  Copyright  VHI. All rights reserved.

## 2016-08-18 NOTE — Therapy (Signed)
[facility] [address] [place], [place], [address] Phone: [phone]   Fax:  [phone]  Physical Therapy Treatment  Patient Details  Name: Stephanie Simmons MRN: [id number] Date of Birth: 04-09-1941 Referring Provider: Dr. Leighton Ruff  Encounter Date: 08/17/2016      PT End of Session - 08/18/16 1147    Visit Number 3   Number of Visits 9   Date for PT Re-Evaluation 09/06/16   Authorization Type Medicare   PT Start Time 1537   PT Stop Time 1622   PT Time Calculation (min) 45 min      Past Medical History:  Diagnosis Date  . Chest pain    a. 04/2009 Cath: essentially nl cors, EF 65%, mod LVH.  Marland Kitchen GERD (gastroesophageal reflux disease)   . Hyperlipidemia   . Hypertension   . Hypothyroidism    does not take medication for this any longer per patient   . LVH (left ventricular hypertrophy)    a. 04/2009 Echo: EF 55-65%, mild conc LVH, no reg wma, Gr 1 DD, mild MR.  . Monocytosis 04/21/2014    Past Surgical History:  Procedure Laterality Date  . ABDOMINAL HYSTERECTOMY    . removal of mass- pleural      There were no vitals filed for this visit.      Subjective Assessment - 08/18/16 1143    Subjective Pt states she tried some of the exercises at home - did them at bathroom counter   Patient is accompained by: Family member   Pertinent History PSVT, atrial fibrillation, HTN, syncope, CHF, chest pain, atypical, atrial flutter   Patient Stated Goals walk more steady, improve balance   Currently in Pain? No/denies                         [facility] Adult PT Treatment/Exercise - 08/18/16 0001      Transfers   Transfers Sit to Stand   Number of Reps 10 reps   Comments on blue AirEx - without UE support     Ambulation/Gait   Ambulation/Gait Yes   Ambulation/Gait Assistance 4: Min guard   Ambulation Distance (Feet) 360 Feet   Assistive device None   Gait Pattern Step-through  pattern;Decreased arm swing - right;Decreased arm swing - left   Ambulation Surface Level;Indoor   Stairs Yes   Stairs Assistance 5: Supervision   Stair Management Technique One rail Right;Alternating pattern;Forwards   Number of Stairs 4   Height of Stairs 6   Gait Comments poles used to fascilitate increased arm swing and to promote elbow extension with arm swing             Balance Exercises - 08/18/16 1146      Balance Exercises: Standing   Rockerboard Anterior/posterior;30 seconds   Other Standing Exercises Ankle sways x 10 reps on floor inside bars without UE support     Pt performed SLS activities on blue mat for compliant surface training - forward, back and side kicks 10 reps each with min assist Marching in place - touching opposite elbow to knee for incr. Coordination - 5 reps each side Crossovers inside bars with UE support 10' x 4 reps Stepping over and back of balance beam with UE support prn with CGA - 10 reps each leg     PT Education - 08/17/16 2006    Education provided Yes   Education Details sit to stand, balance exercises - 3 directions  for kicks, marching and sidestepping   Person(s) Educated Patient;Other (comment)  sister   Methods Explanation;Demonstration;Handout   Comprehension Verbalized understanding;Returned demonstration             PT Long Term Goals - 08/08/16 2100      PT LONG TERM GOAL #1   Title Improve TUG score from 14.75 secs to </= 12.5 secs with no device for reduced fall risk.  (09-06-16)   Baseline 14.75 secs with no device   Time 4   Period Weeks   Status New     PT LONG TERM GOAL #2   Title Incr. gait velocity to >/= 3.2 ft/sec with no device for incr. gait efficiency.  (09-06-16)   Baseline 2.84 ft/sec = 11.56 secs    Time 4   Period Weeks   Status New     PT LONG TERM GOAL #3   Title Pt will demonstrate increased strength in bil. LE's so she is able to perform sit to stand without UE support 5 times from 18"  mat.  (09-06-16)   Baseline 1 time with much momentum used with pt stabilizing against mat table   Time 4   Period Weeks   Status New     PT LONG TERM GOAL #4   Title Amb. 240' with single point cane with SBA without LOB on flat, even surface.  (09-06-16)   Baseline 58' with no device   Time 4   Period Weeks   Status New     PT LONG TERM GOAL #5   Title Independent in HEP for strengthening and balance exercises.   Time 4   Period Weeks   Status New               Plan - 08/18/16 1147    Clinical Impression Statement Discussed benefit of use of cane for increased stability and safety with ambulation on uneven surface but pt declines at this time.  Pt demonstrates improved gait pattern with cues for increased arm swing with elbow extension.  Pt needs UE support for safety with SLS activities on compliant surface.   Rehab Potential Good   PT Frequency 2x / week   PT Duration 4 weeks   PT Treatment/Interventions ADLs/Self Care Home Management;Functional mobility training;Stair training;Gait training;DME Instruction;Therapeutic activities;Therapeutic exercise;Balance training;Neuromuscular re-education;Patient/family education   PT Next Visit Plan cont balance and gait   PT Home Exercise Plan strengthening and balance exercises   Consulted and Agree with Plan of Care Patient;Family member/caregiver   Family Member Consulted sister Vernice      Patient will benefit from skilled therapeutic intervention in order to improve the following deficits and impairments:  Abnormal gait, Cardiopulmonary status limiting activity, Decreased activity tolerance, Decreased balance, Decreased mobility, Decreased strength, Decreased knowledge of use of DME, Decreased endurance, Dizziness  Visit Diagnosis: Other abnormalities of gait and mobility  Unsteadiness on feet     Problem List Patient Active Problem List   Diagnosis Date Noted  . Chest pain, atypical 09/23/2014  . Acute on chronic  diastolic CHF (congestive heart failure), NYHA class 3 ([place]) 09/23/2014  . Acute diastolic heart failure ([place]) 09/23/2014  . Monocytosis 04/21/2014  . Neurofibromatosis ([place]) 04/21/2014  . Poor oral hygiene 04/21/2014  . Atrial fibrillation ([place]) 04/15/2014  . PSVT (paroxysmal supraventricular tachycardia) ([place]) 09/25/2013  . Syncope 09/10/2013  . Atrial flutter ([place]) 09/10/2013  . HTN (hypertension) 09/10/2013    Ruwayda Curet, [place], PT 08/18/2016, 11:52 AM  [facility]  [place], [place], [address] Phone: [phone]   Fax:  [phone]  Name: Stephanie Simmons MRN: [id number] Date of Birth: 03-16-1941

## 2016-08-25 NOTE — Telephone Encounter (Signed)
ERROR

## 2016-08-28 ENCOUNTER — Ambulatory Visit: Payer: Medicare Other | Attending: Family Medicine | Admitting: Physical Therapy

## 2016-08-28 ENCOUNTER — Encounter: Payer: Self-pay | Admitting: Physical Therapy

## 2016-08-28 DIAGNOSIS — R2689 Other abnormalities of gait and mobility: Secondary | ICD-10-CM

## 2016-08-28 DIAGNOSIS — M6281 Muscle weakness (generalized): Secondary | ICD-10-CM

## 2016-08-28 NOTE — Therapy (Signed)
[facility] [address] [place], [place], [address] Phone: [phone]   Fax:  [phone]  Physical Therapy Treatment  Patient Details  Name: Stephanie Simmons MRN: [id number] Date of Birth: [birth year] Referring Provider: Dr. Leighton Ruff  Encounter Date: 08/28/2016      PT End of Session - 08/28/16 1228    Visit Number 4   Number of Visits 9   Date for PT Re-Evaluation 09/06/16   Authorization Type Medicare   PT Start Time 1101   PT Stop Time 1147   PT Time Calculation (min) 46 min      Past Medical History:  Diagnosis Date  . Chest pain    a. 04/2009 Cath: essentially nl cors, EF 65%, mod LVH.  Marland Kitchen GERD (gastroesophageal reflux disease)   . Hyperlipidemia   . Hypertension   . Hypothyroidism    does not take medication for this any longer per patient   . LVH (left ventricular hypertrophy)    a. 04/2009 Echo: EF 55-65%, mild conc LVH, no reg wma, Gr 1 DD, mild MR.  . Monocytosis 04/21/2014    Past Surgical History:  Procedure Laterality Date  . ABDOMINAL HYSTERECTOMY    . removal of mass- pleural      There were no vitals filed for this visit.      Subjective Assessment - 08/28/16 1220    Subjective Pt states she is doing exercises at home and is thinking about trying to let her arms relax and swing when she walks   Pertinent History PSVT, atrial fibrillation, HTN, syncope, CHF, chest pain, atypical, atrial flutter   Patient Stated Goals walk more steady, improve balance   Currently in Pain? No/denies                         [facility] Adult PT Treatment/Exercise - 08/28/16 1221      Ambulation/Gait   Ambulation/Gait Yes   Ambulation/Gait Assistance 5: Supervision   Ambulation Distance (Feet) 360 Feet   Assistive device None   Gait Pattern Step-through pattern;Decreased arm swing - right;Decreased arm swing - left   Ambulation Surface Level;Indoor   Stairs Yes   Stairs  Assistance 5: Supervision   Stair Management Technique Two rails   Number of Stairs 4   Height of Stairs 6     Exercises   Exercises Knee/Hip     Knee/Hip Exercises: Machines for Strengthening   Cybex Leg Press 40# bil. LE's 2 sets 10 reps     Knee/Hip Exercises: Standing   Heel Raises Both;1 set;10 reps   Forward Step Up Both;1 set;Step Height: 6";10 reps   Step Down Both;1 set;10 reps;Step Height: 6"             Balance Exercises - 08/28/16 1225      Balance Exercises: Standing   Rockerboard Anterior/posterior;30 seconds   Other Standing Exercises Toe raises 10 reps:  standing on balance beam perpendicular for incr. hip strategy       NeuroRe-ed: Stepping over and back of balance beam with UE support prn with CGA - 10 reps each leg  Marching on incline with CGA - horizontal head turns 5 reps  Pt stood on blue mat with EC with CGA - no significant LOB occurred        PT Long Term Goals - 08/08/16 2100      PT LONG TERM GOAL #1   Title Improve TUG score from 14.75 secs  to </= 12.5 secs with no device for reduced fall risk.  (09-06-16)   Baseline 14.75 secs with no device   Time 4   Period Weeks   Status New     PT LONG TERM GOAL #2   Title Incr. gait velocity to >/= 3.2 ft/sec with no device for incr. gait efficiency.  (09-06-16)   Baseline 2.84 ft/sec = 11.56 secs    Time 4   Period Weeks   Status New     PT LONG TERM GOAL #3   Title Pt will demonstrate increased strength in bil. LE's so she is able to perform sit to stand without UE support 5 times from 18" mat.  (09-06-16)   Baseline 1 time with much momentum used with pt stabilizing against mat table   Time 4   Period Weeks   Status New     PT LONG TERM GOAL #4   Title Amb. 240' with single point cane with SBA without LOB on flat, even surface.  (09-06-16)   Baseline 61' with no device   Time 4   Period Weeks   Status New     PT LONG TERM GOAL #5   Title Independent in HEP for strengthening and  balance exercises.   Time 4   Period Weeks   Status New               Plan - 08/28/16 1228    Clinical Impression Statement Pt consciously thinking of relaxing arms to achieve arm swing and to minimize holding UE's in elbow flexed position; continues to need verbal cues to increase arm swing.  Balance is improving, with pt demonstrating better Rt SLS than Lt SLS., as more LUE support is needed with LLE SLS                                                                                                Rehab Potential Good   PT Frequency 2x / week   PT Duration 4 weeks   PT Treatment/Interventions ADLs/Self Care Home Management;Functional mobility training;Stair training;Gait training;DME Instruction;Therapeutic activities;Therapeutic exercise;Balance training;Neuromuscular re-education;Patient/family education   PT Next Visit Plan cont balance and gait   PT Home Exercise Plan strengthening and balance exercises   Consulted and Agree with Plan of Care Patient;Family member/caregiver   Family Member Consulted sister Vernice      Patient will benefit from skilled therapeutic intervention in order to improve the following deficits and impairments:  Abnormal gait, Cardiopulmonary status limiting activity, Decreased activity tolerance, Decreased balance, Decreased mobility, Decreased strength, Decreased knowledge of use of DME, Decreased endurance, Dizziness  Visit Diagnosis: Other abnormalities of gait and mobility  Muscle weakness (generalized)     Problem List Patient Active Problem List   Diagnosis Date Noted  . Chest pain, atypical 09/23/2014  . Acute on chronic diastolic CHF (congestive heart failure), NYHA class 3 ([place]) 09/23/2014  . Acute diastolic heart failure ([place]) 09/23/2014  . Monocytosis 04/21/2014  . Neurofibromatosis ([place]) 04/21/2014  . Poor oral hygiene 04/21/2014  . Atrial fibrillation ([place]) 04/15/2014  . PSVT (paroxysmal supraventricular tachycardia) ([place])  09/25/2013  . Syncope 09/10/2013  . Atrial flutter ([place]) 09/10/2013  . HTN (hypertension) 09/10/2013    Blessing Ozga, [place], PT 08/28/2016, 12:41 PM  [facility] [address] [place], [place], [address] Phone: [phone]   Fax:  [phone]  Name: Arabelle Vanduyne MRN: [id number] Date of Birth: [birth year]

## 2016-08-29 ENCOUNTER — Ambulatory Visit: Payer: Medicare Other | Admitting: Physical Therapy

## 2016-08-29 DIAGNOSIS — M6281 Muscle weakness (generalized): Secondary | ICD-10-CM

## 2016-08-29 DIAGNOSIS — R2689 Other abnormalities of gait and mobility: Secondary | ICD-10-CM

## 2016-08-29 NOTE — Therapy (Signed)
[facility] [address] [place], [place], [address] Phone: [phone]   Fax:  [phone]  Physical Therapy Treatment  Patient Details  Name: Stephanie Simmons MRN: [id number] Date of Birth: September 30, 1940 Referring Provider: Dr. Leighton Ruff  Encounter Date: 08/29/2016      PT End of Session - 08/29/16 1712    Visit Number 5   Number of Visits 9   Date for PT Re-Evaluation 09/06/16   Authorization Type Medicare   PT Start Time 1105   PT Stop Time 1147   PT Time Calculation (min) 42 min   Equipment Utilized During Treatment Gait belt      Past Medical History:  Diagnosis Date  . Chest pain    a. 04/2009 Cath: essentially nl cors, EF 65%, mod LVH.  Marland Kitchen GERD (gastroesophageal reflux disease)   . Hyperlipidemia   . Hypertension   . Hypothyroidism    does not take medication for this any longer per patient   . LVH (left ventricular hypertrophy)    a. 04/2009 Echo: EF 55-65%, mild conc LVH, no reg wma, Gr 1 DD, mild MR.  . Monocytosis 04/21/2014    Past Surgical History:  Procedure Laterality Date  . ABDOMINAL HYSTERECTOMY    . removal of mass- pleural      There were no vitals filed for this visit.      Subjective Assessment - 08/29/16 1245    Subjective Pt reports no problems since yesterday when she was here - reports she continues to do her exercises at home   Pertinent History PSVT, atrial fibrillation, HTN, syncope, CHF, chest pain, atypical, atrial flutter   Patient Stated Goals walk more steady, improve balance   Currently in Pain? No/denies                         [facility] Adult PT Treatment/Exercise - 08/29/16 1118      Transfers   Transfers Sit to Stand   Sit to Stand 5: Supervision   Number of Reps Other reps (comment)  3 reps on floor, 5 reps on AirEx without UE support     Ambulation/Gait   Ambulation/Gait Yes   Ambulation/Gait Assistance 5: Supervision   Ambulation/Gait Assistance Details 230  230 with poles, 230' without use of poles   Ambulation Distance (Feet) 460 Feet  115 x 4 = 460' total   Assistive device Other (Comment)  use of walking poles to fascilitate arm swing   Gait Pattern Step-through pattern   Ambulation Surface Level;Indoor     Knee/Hip Exercises: Standing   Forward Step Up Both;1 set;10 reps;Hand Hold: 1   Other Standing Knee Exercises Pt performed standing hip flexion, abduction and extension with 2# weight on each leg 10 reps each direction     TherEx:  SciFit level 1.5 with all 4 extremities for strengthening - verbal cues given for big movements with arms and legs        Balance Exercises - 08/29/16 1252      Balance Exercises: Standing   Other Standing Exercises Pt performed standiing on incline - horizontall and vertical head turns 10 reps each direction with CGA:  alternate stepping up/back with CGA on incline 10 reps each leg     NeuroRe-ed: Pt performed zoom ball - standing on floor initially with SBA for safety; progressed to standing on blue AirEx with CGA approx. 15 reps  Pt amb. Tossing medium-sized ball and catching (cues to  toss in small ROM) 115' around track to improve multi-tasking  with gait with CGA  Pt performed standing balance activities on compliant surface (blue mat) - forward and side kicks 10 reps with CGA to min assist Marching in place 10 reps each leg with CGA (on this blue mat)         PT Long Term Goals - 08/08/16 2100      PT LONG TERM GOAL #1   Title Improve TUG score from 14.75 secs to </= 12.5 secs with no device for reduced fall risk.  (09-06-16)   Baseline 14.75 secs with no device   Time 4   Period Weeks   Status New     PT LONG TERM GOAL #2   Title Incr. gait velocity to >/= 3.2 ft/sec with no device for incr. gait efficiency.  (09-06-16)   Baseline 2.84 ft/sec = 11.56 secs    Time 4   Period Weeks   Status New     PT LONG TERM GOAL #3   Title Pt will  demonstrate increased strength in bil. LE's so she is able to perform sit to stand without UE support 5 times from 18" mat.  (09-06-16)   Baseline 1 time with much momentum used with pt stabilizing against mat table   Time 4   Period Weeks   Status New     PT LONG TERM GOAL #4   Title Amb. 240' with single point cane with SBA without LOB on flat, even surface.  (09-06-16)   Baseline 69' with no device   Time 4   Period Weeks   Status New     PT LONG TERM GOAL #5   Title Independent in HEP for strengthening and balance exercises.   Time 4   Period Weeks   Status New               Plan - 08/29/16 1258    Clinical Impression Statement Pt demonstrated much carryover of increased arm swing with elbows extended after using poles for fascilitation of reciprocal arm swing in gait.  Pt demonstrated arm swing in activities after gait training without being instructed to do so.  Pt needs cues for increased step length with cues for increased initial heel contact, with slightly less heel contact achieved on LLE than on RLE.  Pt progressing well towards goals.   Rehab Potential Good   PT Frequency 2x / week   PT Duration 4 weeks   PT Treatment/Interventions ADLs/Self Care Home Management;Functional mobility training;Stair training;Gait training;DME Instruction;Therapeutic activities;Therapeutic exercise;Balance training;Neuromuscular re-education;Patient/family education   PT Next Visit Plan cont balance and gait   PT Home Exercise Plan strengthening and balance exercises   Consulted and Agree with Plan of Care Patient;Family member/caregiver      Patient will benefit from skilled therapeutic intervention in order to improve the following deficits and impairments:  Abnormal gait, Cardiopulmonary status limiting activity, Decreased activity tolerance, Decreased balance, Decreased mobility, Decreased strength, Decreased knowledge of use of DME, Decreased endurance, Dizziness  Visit  Diagnosis: Other abnormalities of gait and mobility  Muscle weakness (generalized)     Problem List Patient Active Problem List   Diagnosis Date Noted  . Chest pain, atypical 09/23/2014  . Acute on chronic diastolic CHF (congestive heart failure), NYHA class 3 ([place]) 09/23/2014  . Acute diastolic heart failure ([place]) 09/23/2014  . Monocytosis 04/21/2014  . Neurofibromatosis ([place]) 04/21/2014  . Poor oral hygiene 04/21/2014  . Atrial fibrillation ([place]) 04/15/2014  .  PSVT (paroxysmal supraventricular tachycardia) ([place]) 09/25/2013  . Syncope 09/10/2013  . Atrial flutter ([place]) 09/10/2013  . HTN (hypertension) 09/10/2013    Krystopher Kuenzel, [place], PT 08/29/2016, 5:15 PM  [facility] [address] [place], [place], [address] Phone: [phone]   Fax:  [phone]  Name: Stephanie Simmons MRN: [id number] Date of Birth: 1941/06/24

## 2016-09-05 ENCOUNTER — Ambulatory Visit: Payer: Medicare Other | Admitting: Physical Therapy

## 2016-09-05 DIAGNOSIS — M6281 Muscle weakness (generalized): Secondary | ICD-10-CM | POA: Diagnosis not present

## 2016-09-05 DIAGNOSIS — R2689 Other abnormalities of gait and mobility: Secondary | ICD-10-CM

## 2016-09-06 NOTE — Therapy (Signed)
[facility] [address] [place], [place], [address] Phone: [phone]   Fax:  [phone]  Physical Therapy Treatment  Patient Details  Name: Stephanie Simmons MRN: [id number] Date of Birth: [birth year] Referring Provider: Dr. Leighton Ruff  Encounter Date: 09/05/2016      PT End of Session - 09/06/16 0942    Visit Number 6   Number of Visits 9   Date for PT Re-Evaluation 09/06/16   Authorization Type Medicare   PT Start Time 1102   PT Stop Time 1146   PT Time Calculation (min) 44 min      Past Medical History:  Diagnosis Date  . Chest pain    a. 04/2009 Cath: essentially nl cors, EF 65%, mod LVH.  Marland Kitchen GERD (gastroesophageal reflux disease)   . Hyperlipidemia   . Hypertension   . Hypothyroidism    does not take medication for this any longer per patient   . LVH (left ventricular hypertrophy)    a. 04/2009 Echo: EF 55-65%, mild conc LVH, no reg wma, Gr 1 DD, mild MR.  . Monocytosis 04/21/2014    Past Surgical History:  Procedure Laterality Date  . ABDOMINAL HYSTERECTOMY    . removal of mass- pleural      There were no vitals filed for this visit.      Subjective Assessment - 09/06/16 0916    Subjective Pt reports she is very tired today - has felt this way since Sunday - feels as if she has low energy   Patient is accompained by: Family member   Pertinent History PSVT, atrial fibrillation, HTN, syncope, CHF, chest pain, atypical, atrial flutter   Patient Stated Goals walk more steady, improve balance   Currently in Pain? No/denies                         [facility] Adult PT Treatment/Exercise - 09/06/16 0001      Transfers   Transfers Sit to Stand   Sit to Stand 5: Supervision   Number of Reps Other reps (comment)  5 reps on floor; 5 on AirEx   Comments no UE support used     Ambulation/Gait   Ambulation/Gait Yes   Ambulation/Gait Assistance 5: Supervision   Ambulation/Gait  Assistance Details cues for incr. arm swing   Ambulation Distance (Feet) 230 Feet   Assistive device None   Gait Pattern Step-through pattern   Ambulation Surface Level;Indoor   Stairs Yes   Stairs Assistance 5: Supervision   Stair Management Technique One rail Right;Alternating pattern;Step to pattern   Number of Stairs 4   Height of Stairs 6             Balance Exercises - 09/06/16 0928      Balance Exercises: Standing   Sidestepping 2 reps   Other Standing Exercises Pt performed marching in place - touching elbow to opposite knee       NeuroRe-ed: Stepping over and back of balance beam with UE support prn with CGA - 10 reps each leg  Marching on Airex in place 10 reps each leg with minimal UE support inside // bars  TherEx:  Heel raises x 10 reps;  Toe raises x 10 reps Step ups RLE and LLE x 10 reps onto 6" step with 1 UE support on hand rail        PT Long Term Goals - 08/08/16 2100      PT LONG TERM GOAL #  1   Title Improve TUG score from 14.75 secs to </= 12.5 secs with no device for reduced fall risk.  (09-06-16)   Baseline 14.75 secs with no device   Time 4   Period Weeks   Status New     PT LONG TERM GOAL #2   Title Incr. gait velocity to >/= 3.2 ft/sec with no device for incr. gait efficiency.  (09-06-16)   Baseline 2.84 ft/sec = 11.56 secs    Time 4   Period Weeks   Status New     PT LONG TERM GOAL #3   Title Pt will demonstrate increased strength in bil. LE's so she is able to perform sit to stand without UE support 5 times from 18" mat.  (09-06-16)   Baseline 1 time with much momentum used with pt stabilizing against mat table   Time 4   Period Weeks   Status New     PT LONG TERM GOAL #4   Title Amb. 240' with single point cane with SBA without LOB on flat, even surface.  (09-06-16)   Baseline 62' with no device   Time 4   Period Weeks   Status New     PT LONG TERM GOAL #5   Title Independent in HEP for strengthening and balance exercises.    Time 4   Period Weeks   Status New               Plan - 09/06/16 0943    Clinical Impression Statement Pt progressing well towards goals - carryover is inconsistent with elbow extension with arm swing in gait, however, is much improved compared to posture at time of eval as pt held bil. elbows flexed during gait at that time   Rehab Potential Good   PT Frequency 2x / week   PT Duration 4 weeks   PT Treatment/Interventions ADLs/Self Care Home Management;Functional mobility training;Stair training;Gait training;DME Instruction;Therapeutic activities;Therapeutic exercise;Balance training;Neuromuscular re-education;Patient/family education   PT Next Visit Plan cont balance and gait   PT Home Exercise Plan strengthening and balance exercises   Consulted and Agree with Plan of Care Patient;Family member/caregiver   Family Member Consulted sister Vernice      Patient will benefit from skilled therapeutic intervention in order to improve the following deficits and impairments:  Abnormal gait, Cardiopulmonary status limiting activity, Decreased activity tolerance, Decreased balance, Decreased mobility, Decreased strength, Decreased knowledge of use of DME, Decreased endurance, Dizziness  Visit Diagnosis: Other abnormalities of gait and mobility  Muscle weakness (generalized)     Problem List Patient Active Problem List   Diagnosis Date Noted  . Chest pain, atypical 09/23/2014  . Acute on chronic diastolic CHF (congestive heart failure), NYHA class 3 ([place]) 09/23/2014  . Acute diastolic heart failure ([place]) 09/23/2014  . Monocytosis 04/21/2014  . Neurofibromatosis ([place]) 04/21/2014  . Poor oral hygiene 04/21/2014  . Atrial fibrillation ([place]) 04/15/2014  . PSVT (paroxysmal supraventricular tachycardia) ([place]) 09/25/2013  . Syncope 09/10/2013  . Atrial flutter ([place]) 09/10/2013  . HTN (hypertension) 09/10/2013    Brodi Nery, [place], PT 09/06/2016, 9:49 AM  [facility] [address] [place], [place], [address] Phone: [phone]   Fax:  [phone]  Name: Stephanie Simmons MRN: [id number] Date of Birth: 1941-05-30

## 2016-09-07 ENCOUNTER — Ambulatory Visit: Payer: Medicare Other | Admitting: Physical Therapy

## 2016-09-07 ENCOUNTER — Other Ambulatory Visit: Payer: Self-pay | Admitting: *Deleted

## 2016-09-07 DIAGNOSIS — M6281 Muscle weakness (generalized): Secondary | ICD-10-CM | POA: Diagnosis not present

## 2016-09-07 DIAGNOSIS — R2689 Other abnormalities of gait and mobility: Secondary | ICD-10-CM

## 2016-09-07 MED ORDER — POTASSIUM CHLORIDE CRYS ER 20 MEQ PO TBCR: 40.0000 meq | EXTENDED_RELEASE_TABLET | Freq: Two times a day (BID) | ORAL | 3 refills | Status: DC

## 2016-09-08 NOTE — Therapy (Signed)
[facility] [address] [place], [place], [address] Phone: [phone]   Fax:  [phone]  Physical Therapy Treatment  Patient Details  Name: Stephanie Simmons MRN: [id number] Date of Birth: [birth year] Referring Provider: Dr. Leighton Ruff  Encounter Date: 09/07/2016      PT End of Session - 09/08/16 1526    Visit Number 7  G code completed 09-07-16   Number of Visits 15   Date for PT Re-Evaluation 10/08/16   Authorization Type Medicare   Authorization Time Period 09-07-16  - 11-06-16   PT Start Time 1404   PT Stop Time 1448   PT Time Calculation (min) 44 min   Equipment Utilized During Treatment Gait belt      Past Medical History:  Diagnosis Date  . Chest pain    a. 04/2009 Cath: essentially nl cors, EF 65%, mod LVH.  Marland Kitchen GERD (gastroesophageal reflux disease)   . Hyperlipidemia   . Hypertension   . Hypothyroidism    does not take medication for this any longer per patient   . LVH (left ventricular hypertrophy)    a. 04/2009 Echo: EF 55-65%, mild conc LVH, no reg wma, Gr 1 DD, mild MR.  . Monocytosis 04/21/2014    Past Surgical History:  Procedure Laterality Date  . ABDOMINAL HYSTERECTOMY    . removal of mass- pleural      There were no vitals filed for this visit.      Subjective Assessment - 09/08/16 1513    Subjective Pt reports she is feeling better- has more energy today   Patient is accompained by: Family member   Pertinent History PSVT, atrial fibrillation, HTN, syncope, CHF, chest pain, atypical, atrial flutter   Patient Stated Goals walk more steady, improve balance   Currently in Pain? No/denies                         [facility] Adult PT Treatment/Exercise - 09/08/16 0001      Transfers   Transfers Sit to Stand   Sit to Stand 5: Supervision   Number of Reps Other reps (comment)  5 reps   Comments on blue AIrex with no UE support      Ambulation/Gait   Ambulation/Gait Yes   Ambulation/Gait Assistance 5: Supervision   Ambulation/Gait Assistance Details cues for arm swing   Ambulation Distance (Feet) 350 Feet   Assistive device None   Gait Pattern Step-through pattern   Ambulation Surface Level;Indoor   Gait velocity 2.95 ft/sec = 11.12 secs     Standardized Balance Assessment   Standardized Balance Assessment Timed Up and Go Test     Timed Up and Go Test   TUG Normal TUG   Normal TUG (seconds) 14.62     Knee/Hip Exercises: Machines for Strengthening   Cybex Leg Press 40# bil. LE's 2 sets 10 reps     Knee/Hip Exercises: Standing   Heel Raises Both;1 set;10 reps                     PT Long Term Goals - 09/07/16 1423      PT LONG TERM GOAL #1   Title Improve TUG score from 14.75 secs to </= 12.5 secs with no device for reduced fall risk.  (09-06-16)/ NEW TARGET 10-08-16  EXTEND UNMET LTG's til 10-08-16   Baseline 14.62 secs   Time 4   Period Weeks   Status On-going  PT LONG TERM GOAL #2   Title Incr. gait velocity to >/= 3.2 ft/sec with no device for incr. gait efficiency.  (09-06-16)/10-08-16   Baseline 11.13 secs = 2.95 ft/sec   Status On-going     PT LONG TERM GOAL #3   Title Pt will demonstrate increased strength in bil. LE's so she is able to perform sit to stand without UE support 5 times from 18" mat.  (09-06-16)   Baseline met 04-Oct-2016   Status Achieved     PT LONG TERM GOAL #4   Title Amb. 240' with single point cane with SBA without LOB on flat, even surface.  (09-06-16)   Baseline met 10-04-2016 -- no device needed   Status Achieved     PT LONG TERM GOAL #5   Title Independent in HEP for strengthening and balance exercises.   Status Achieved     Additional Long Term Goals   Additional Long Term Goals Yes     PT LONG TERM GOAL #6   Title Amb. 300' outside on uneven surface without device with min cues for arm swing with elbow extension.  10-08-16   Time 4   Period Weeks   Status New     PT  LONG TERM GOAL #7   Title Transfer floor to stand with UE support with SBA.  10-08-16   Time 4   Period Weeks   Status New               Plan - 09/08/16 1529    Clinical Impression Statement Pt has met LTG's #3-5:  #1 and 2 not fully met but scores have increased since initial evaluation; pt continues to need cues for elbow extension with arm swing during gait; pt has made good progress since initial eval and would benefit from additional 4 weeks of PT to maximize functional mobility                                                                                                             Rehab Potential Good   PT Frequency 2x / week   PT Duration 4 weeks   PT Treatment/Interventions ADLs/Self Care Home Management;Functional mobility training;Stair training;Gait training;DME Instruction;Therapeutic activities;Therapeutic exercise;Balance training;Neuromuscular re-education;Patient/family education   PT Next Visit Plan cont balance and gait; renewal completed 09-08-16   PT Home Exercise Plan strengthening and balance exercises   Consulted and Agree with Plan of Care Patient;Family member/caregiver   Family Member Consulted sister Vernice      Patient will benefit from skilled therapeutic intervention in order to improve the following deficits and impairments:  Abnormal gait, Cardiopulmonary status limiting activity, Decreased activity tolerance, Decreased balance, Decreased mobility, Decreased strength, Decreased knowledge of use of DME, Decreased endurance, Dizziness  Visit Diagnosis: Other abnormalities of gait and mobility - Plan: PT plan of care cert/re-cert  Muscle weakness (generalized) - Plan: PT plan of care cert/re-cert       G-Codes - 2016-10-04 1535    Functional Assessment Tool Used (Outpatient Only) TUG score 14.62 secs:  gait velocity 2.95 ft/sec with no device   Functional Limitation Mobility: Walking and moving around   Mobility: Walking and Moving Around Current  Status [phone]) At least 20 percent but less than 40 percent impaired, limited or restricted   Mobility: Walking and Moving Around Goal Status [phone]) At least 1 percent but less than 20 percent impaired, limited or restricted      Problem List Patient Active Problem List   Diagnosis Date Noted  . Chest pain, atypical 09/23/2014  . Acute on chronic diastolic CHF (congestive heart failure), NYHA class 3 ([place]) 09/23/2014  . Acute diastolic heart failure ([place]) 09/23/2014  . Monocytosis 04/21/2014  . Neurofibromatosis ([place]) 04/21/2014  . Poor oral hygiene 04/21/2014  . Atrial fibrillation ([place]) 04/15/2014  . PSVT (paroxysmal supraventricular tachycardia) ([place]) 09/25/2013  . Syncope 09/10/2013  . Atrial flutter ([place]) 09/10/2013  . HTN (hypertension) 09/10/2013    Analysa Nutting, [place], PT 09/08/2016, 3:52 PM  [facility] [address] [place], [place], [address] Phone: [phone]   Fax:  [phone]  Name: Stephanie Simmons MRN: [id number] Date of Birth: 16-Aug-1940

## 2016-09-12 ENCOUNTER — Ambulatory Visit: Payer: Medicare Other | Admitting: Physical Therapy

## 2016-09-12 DIAGNOSIS — R2689 Other abnormalities of gait and mobility: Secondary | ICD-10-CM

## 2016-09-12 DIAGNOSIS — M6281 Muscle weakness (generalized): Secondary | ICD-10-CM | POA: Diagnosis not present

## 2016-09-13 NOTE — Therapy (Signed)
[facility] [address] [place], [place], [address] Phone: [phone]   Fax:  [phone]  Physical Therapy Treatment  Patient Details  Name: Stephanie Simmons MRN: [id number] Date of Birth: December 21, 1940 Referring Provider: Dr. Leighton Simmons  Encounter Date: 09/12/2016      PT End of Session - 09/13/16 0651    Visit Number 8  G1   Number of Visits 15   Date for PT Re-Evaluation 10/08/16   Authorization Type Medicare   Authorization Time Period 09-07-16  - 11-06-16   PT Start Time 1104   PT Stop Time 1150   PT Time Calculation (min) 46 min      Past Medical History:  Diagnosis Date  . Chest pain    a. 04/2009 Cath: essentially nl cors, EF 65%, mod LVH.  Marland Kitchen GERD (gastroesophageal reflux disease)   . Hyperlipidemia   . Hypertension   . Hypothyroidism    does not take medication for this any longer per patient   . LVH (left ventricular hypertrophy)    a. 04/2009 Echo: EF 55-65%, mild conc LVH, no reg wma, Gr 1 DD, mild MR.  . Monocytosis 04/21/2014    Past Surgical History:  Procedure Laterality Date  . ABDOMINAL HYSTERECTOMY    . removal of mass- pleural      There were no vitals filed for this visit.      Subjective Assessment - 09/13/16 0642    Subjective Pt reports no problems or changes since last Thurs. - "I'm doing OK"   Pertinent History PSVT, atrial fibrillation, HTN, syncope, CHF, chest pain, atypical, atrial flutter   Patient Stated Goals walk more steady, improve balance   Currently in Pain? No/denies                         [facility] Adult PT Treatment/Exercise - 09/13/16 0001      Transfers   Transfers Sit to Stand   Sit to Stand 5: Supervision   Number of Reps Other reps (comment)  5 reps   Comments on blue AIrex with no UE support      Ambulation/Gait   Ambulation/Gait Yes   Ambulation/Gait Assistance 5: Supervision   Ambulation/Gait Assistance Details cues for  incr. arm swing and incr. step length   Ambulation Distance (Feet) 345 Feet   Assistive device None   Gait Pattern Step-through pattern   Ambulation Surface Level;Indoor     Knee/Hip Exercises: Aerobic   Nustep Level 4 x 5" with UE's and LE's     Knee/Hip Exercises: Machines for Strengthening   Cybex Leg Press 40# bil. LE's 3 sets 10 reps     Knee/Hip Exercises: Standing   Heel Raises Both;1 set;10 reps   Forward Step Up Both;1 set;10 reps;Hand Hold: 1   Other Standing Knee Exercises Pt performed standing hip flexion, abduction and extension with 3# weight on each leg 10 reps each direction             Balance Exercises - 09/13/16 0647      Balance Exercises: Standing   Sidestepping 1 rep   Step Over Hurdles / Cones stepping over balance beam inside // bars 10 reps each leg with minimal UE support   Other Standing Exercises Pt performed marching in place- lifting opposite arm up overhead for incr. corrdination     Rockerboard inside bars with UE support prn 20 reps with CGA  Obstacle course to improve balance with  turning - walking around hula hoop, Fig. 8's around cones, stepping over  balance beam on floor with CGA           PT Long Term Goals - 09/07/16 1423      PT LONG TERM GOAL #1   Title Improve TUG score from 14.75 secs to </= 12.5 secs with no device for reduced fall risk.  (09-06-16)/ NEW TARGET 10-08-16  EXTEND UNMET LTG's til 10-08-16   Baseline 14.62 secs   Time 4   Period Weeks   Status On-going     PT LONG TERM GOAL #2   Title Incr. gait velocity to >/= 3.2 ft/sec with no device for incr. gait efficiency.  (09-06-16)/10-08-16   Baseline 11.13 secs = 2.95 ft/sec   Status On-going     PT LONG TERM GOAL #3   Title Pt will demonstrate increased strength in bil. LE's so she is able to perform sit to stand without UE support 5 times from 18" mat.  (09-06-16)   Baseline met 09-07-16   Status Achieved     PT LONG TERM GOAL #4   Title Amb. 240' with  single point cane with SBA without LOB on flat, even surface.  (09-06-16)   Baseline met 09-07-16 -- no device needed   Status Achieved     PT LONG TERM GOAL #5   Title Independent in HEP for strengthening and balance exercises.   Status Achieved     Additional Long Term Goals   Additional Long Term Goals Yes     PT LONG TERM GOAL #6   Title Amb. 300' outside on uneven surface without device with min cues for arm swing with elbow extension.  10-08-16   Time 4   Period Weeks   Status New     PT LONG TERM GOAL #7   Title Transfer floor to stand with UE support with SBA.  10-08-16   Time 4   Period Weeks   Status New               Plan - 09/13/16 0654    Clinical Impression Statement Pt continues to need cues to let arms relax as she holds bil. UE's flexed at 90 degrees in elbow flexion with bil. wrists flexed; balance is improving with minimal LOB noted on compliant surface   Rehab Potential Good   PT Frequency 2x / week   PT Duration 4 weeks   PT Treatment/Interventions ADLs/Self Care Home Management;Functional mobility training;Stair training;Gait training;DME Instruction;Therapeutic activities;Therapeutic exercise;Balance training;Neuromuscular re-education;Patient/family education   PT Next Visit Plan cont gait and balance   PT Home Exercise Plan strengthening and balance exercises   Consulted and Agree with Plan of Care Patient;Family member/caregiver   Family Member Consulted sister Vernice      Patient will benefit from skilled therapeutic intervention in order to improve the following deficits and impairments:  Abnormal gait, Cardiopulmonary status limiting activity, Decreased activity tolerance, Decreased balance, Decreased mobility, Decreased strength, Decreased knowledge of use of DME, Decreased endurance, Dizziness  Visit Diagnosis: Other abnormalities of gait and mobility  Muscle weakness (generalized)     Problem List Patient Active Problem List    Diagnosis Date Noted  . Chest pain, atypical 09/23/2014  . Acute on chronic diastolic CHF (congestive heart failure), NYHA class 3 ([place]) 09/23/2014  . Acute diastolic heart failure ([place]) 09/23/2014  . Monocytosis 04/21/2014  . Neurofibromatosis ([place]) 04/21/2014  . Poor oral hygiene 04/21/2014  . Atrial fibrillation ([place]) 04/15/2014  .  PSVT (paroxysmal supraventricular tachycardia) ([place]) 09/25/2013  . Syncope 09/10/2013  . Atrial flutter ([place]) 09/10/2013  . HTN (hypertension) 09/10/2013    Lorna Strother, [place], PT 09/13/2016, 6:58 AM  [facility] [address] [place], [place], [address] Phone: [phone]   Fax:  [phone]  Name: Stephanie Simmons MRN: [id number] Date of Birth: 1941-05-13

## 2016-09-14 ENCOUNTER — Ambulatory Visit: Payer: Medicare Other | Admitting: Physical Therapy

## 2016-09-14 DIAGNOSIS — M6281 Muscle weakness (generalized): Secondary | ICD-10-CM | POA: Diagnosis not present

## 2016-09-14 DIAGNOSIS — R2689 Other abnormalities of gait and mobility: Secondary | ICD-10-CM

## 2016-09-15 NOTE — Therapy (Signed)
[facility] [address] [place], [place], [address] Phone: [phone]   Fax:  [phone]  Physical Therapy Treatment  Patient Details  Name: Stephanie Simmons MRN: [id number] Date of Birth: [birth year] Referring Provider: Dr. Leighton Ruff  Encounter Date: 09/14/2016      PT End of Session - 09/15/16 1345    Visit Number 9  G2   Number of Visits 15   Date for PT Re-Evaluation 10/08/16   Authorization Type Medicare   Authorization Time Period 09-07-16  - 11-06-16   PT Start Time 1316   PT Stop Time 1401   PT Time Calculation (min) 45 min   Equipment Utilized During Treatment Gait belt      Past Medical History:  Diagnosis Date  . Chest pain    a. 04/2009 Cath: essentially nl cors, EF 65%, mod LVH.  Marland Kitchen GERD (gastroesophageal reflux disease)   . Hyperlipidemia   . Hypertension   . Hypothyroidism    does not take medication for this any longer per patient   . LVH (left ventricular hypertrophy)    a. 04/2009 Echo: EF 55-65%, mild conc LVH, no reg wma, Gr 1 DD, mild MR.  . Monocytosis 04/21/2014    Past Surgical History:  Procedure Laterality Date  . ABDOMINAL HYSTERECTOMY    . removal of mass- pleural      There were no vitals filed for this visit.      Subjective Assessment - 09/15/16 1341    Subjective Pt reports no problems - states she is trying to remember to keep her arms down when she walks   Pertinent History PSVT, atrial fibrillation, HTN, syncope, CHF, chest pain, atypical, atrial flutter   Patient Stated Goals walk more steady, improve balance                         [facility] Adult PT Treatment/Exercise - 09/15/16 0001      Transfers   Transfers Sit to Stand   Sit to Stand 5: Supervision   Sit to Stand Details Verbal cues for technique;Verbal cues for sequencing   Number of Reps Other reps (comment)  5   Comments on blue foam balance beam     Ambulation/Gait   Ambulation/Gait Yes   Ambulation/Gait Assistance 5: Supervision   Ambulation/Gait Assistance Details pt held ball overhead to fascilitate upright posture and elbow extension   Ambulation Distance (Feet) 230 Feet   Assistive device None   Gait Pattern Step-through pattern   Ambulation Surface Level;Indoor     Knee/Hip Exercises: Machines for Strengthening   Cybex Leg Press 40# bil. LE's 3 sets 10 reps     Knee/Hip Exercises: Standing   Heel Raises Both;1 set;10 reps   Forward Step Up Both;1 set;10 reps;Hand Hold: 1   Other Standing Knee Exercises Pt performed standing hip flexion, abduction and extension with 3# weight on each leg 10 reps each direction             Balance Exercises - 09/15/16 1343      Balance Exercises: Standing   Stepping Strategy Anterior;Posterior;10 reps  on incline and then on decline   Sidestepping 1 rep   Step Over Hurdles / Cones stepping over balance beam inside // bars 10 reps each leg with minimal UE support   Other Standing Exercises Pt performed marching in place 10 reps each leg on incline and then on decline  PT Long Term Goals - 09/07/16 1423      PT LONG TERM GOAL #1   Title Improve TUG score from 14.75 secs to </= 12.5 secs with no device for reduced fall risk.  (09-06-16)/ NEW TARGET 10-08-16  EXTEND UNMET LTG's til 10-08-16   Baseline 14.62 secs   Time 4   Period Weeks   Status On-going     PT LONG TERM GOAL #2   Title Incr. gait velocity to >/= 3.2 ft/sec with no device for incr. gait efficiency.  (09-06-16)/10-08-16   Baseline 11.13 secs = 2.95 ft/sec   Status On-going     PT LONG TERM GOAL #3   Title Pt will demonstrate increased strength in bil. LE's so she is able to perform sit to stand without UE support 5 times from 18" mat.  (09-06-16)   Baseline met 09-07-16   Status Achieved     PT LONG TERM GOAL #4   Title Amb. 240' with single point cane with SBA without LOB on flat, even surface.  (09-06-16)    Baseline met 09-07-16 -- no device needed   Status Achieved     PT LONG TERM GOAL #5   Title Independent in HEP for strengthening and balance exercises.   Status Achieved     Additional Long Term Goals   Additional Long Term Goals Yes     PT LONG TERM GOAL #6   Title Amb. 300' outside on uneven surface without device with min cues for arm swing with elbow extension.  10-08-16   Time 4   Period Weeks   Status New     PT LONG TERM GOAL #7   Title Transfer floor to stand with UE support with SBA.  10-08-16   Time 4   Period Weeks   Status New               Plan - 09/15/16 1345    Clinical Impression Statement Pt improving with holding arms in relaxed position with elbow extension during gait; less cues needed today to for arm swing; pt progressing towards LTG's   Rehab Potential Good   PT Frequency 2x / week   PT Duration 4 weeks   PT Treatment/Interventions ADLs/Self Care Home Management;Functional mobility training;Stair training;Gait training;DME Instruction;Therapeutic activities;Therapeutic exercise;Balance training;Neuromuscular re-education;Patient/family education   PT Next Visit Plan cont gait and balance   PT Home Exercise Plan strengthening and balance exercises   Consulted and Agree with Plan of Care Patient;Family member/caregiver   Family Member Consulted sister Vernice      Patient will benefit from skilled therapeutic intervention in order to improve the following deficits and impairments:  Abnormal gait, Cardiopulmonary status limiting activity, Decreased activity tolerance, Decreased balance, Decreased mobility, Decreased strength, Decreased knowledge of use of DME, Decreased endurance, Dizziness  Visit Diagnosis: Other abnormalities of gait and mobility  Muscle weakness (generalized)     Problem List Patient Active Problem List   Diagnosis Date Noted  . Chest pain, atypical 09/23/2014  . Acute on chronic diastolic CHF (congestive heart failure),  NYHA class 3 ([place]) 09/23/2014  . Acute diastolic heart failure ([place]) 09/23/2014  . Monocytosis 04/21/2014  . Neurofibromatosis ([place]) 04/21/2014  . Poor oral hygiene 04/21/2014  . Atrial fibrillation ([place]) 04/15/2014  . PSVT (paroxysmal supraventricular tachycardia) ([place]) 09/25/2013  . Syncope 09/10/2013  . Atrial flutter ([place]) 09/10/2013  . HTN (hypertension) 09/10/2013    Stephanie Simmons, [place], PT 09/15/2016, 1:48 PM  [facility]  [address] [place], [place], [address] Phone: [phone]   Fax:  [phone]  Name: Stephanie Simmons MRN: [id number] Date of Birth: June 24, 1941

## 2016-09-19 ENCOUNTER — Ambulatory Visit: Payer: Medicare Other | Admitting: Physical Therapy

## 2016-09-19 DIAGNOSIS — R2689 Other abnormalities of gait and mobility: Secondary | ICD-10-CM | POA: Diagnosis not present

## 2016-09-19 DIAGNOSIS — M6281 Muscle weakness (generalized): Secondary | ICD-10-CM | POA: Diagnosis not present

## 2016-09-20 NOTE — Therapy (Signed)
[facility] [address] [place], [place], 12/01/1940 Phone: [phone]   Fax:  [phone]  Physical Therapy Treatment  Patient Details  Name: Stephanie Simmons MRN: [id number] Date of Birth: [birth year] Referring Provider: Dr. Leighton Ruff  Encounter Date: 09/19/2016      PT End of Session - 09/20/16 1149    Visit Number 10  G3   Number of Visits 15   Date for PT Re-Evaluation 10/08/16   Authorization Type Medicare   Authorization Time Period 09-07-16  - 11-06-16   PT Start Time 1017   PT Stop Time 1101   PT Time Calculation (min) 44 min   Equipment Utilized During Treatment Gait belt      Past Medical History:  Diagnosis Date  . Chest pain    a. 04/2009 Cath: essentially nl cors, EF 65%, mod LVH.  Marland Kitchen GERD (gastroesophageal reflux disease)   . Hyperlipidemia   . Hypertension   . Hypothyroidism    does not take medication for this any longer per patient   . LVH (left ventricular hypertrophy)    a. 04/2009 Echo: EF 55-65%, mild conc LVH, no reg wma, Gr 1 DD, mild MR.  . Monocytosis 04/21/2014    Past Surgical History:  Procedure Laterality Date  . ABDOMINAL HYSTERECTOMY    . removal of mass- pleural      There were no vitals filed for this visit.      Subjective Assessment - 09/20/16 1142    Subjective Pt reports she has been doing the exercises at home - really working on sit to stand without use of hands   Pertinent History PSVT, atrial fibrillation, HTN, syncope, CHF, chest pain, atypical, atrial flutter   Patient Stated Goals walk more steady, improve balance   Currently in Pain? Yes   Pain Score 3    Pain Location Knee   Pain Orientation Right;Left   Pain Descriptors / Indicators Aching;Sore   Pain Type Chronic pain   Pain Onset More than a month ago   Pain Frequency Intermittent   Aggravating Factors  no specific aggravating factors   Pain Relieving Factors none    Multiple Pain Sites  No                         [facility] Adult PT Treatment/Exercise - 09/20/16 0001      Transfers   Transfers Sit to Stand   Sit to Stand 5: Supervision   Number of Reps Other reps (comment)  5   Comments on blue AirEx foam pad - no UE support used     Ambulation/Gait   Ambulation/Gait Yes   Ambulation/Gait Assistance 5: Supervision   Ambulation/Gait Assistance Details cues to increase arm swing with elbows extended   Ambulation Distance (Feet) 230 Feet   Assistive device None   Gait Pattern Step-through pattern   Ambulation Surface Level;Indoor     Knee/Hip Exercises: Aerobic   Nustep Level 3 x 5" with UE's and LE's     Knee/Hip Exercises: Machines for Strengthening   Cybex Leg Press 40# bil. LE's 3 sets 10 reps  assistance given for eccentric control     Knee/Hip Exercises: Standing   Heel Raises Both;1 set;10 reps   Forward Step Up Both;1 set;10 reps;Hand Hold: 1             Balance Exercises - 09/20/16 1148      Balance Exercises: Standing   Stepping Strategy  Anterior;Posterior;10 reps  on incline and then on decline   Sidestepping 1 rep   Step Over Hurdles / Cones pt performed stepping over orange hurdle at counter with UE support prn with CGA 10 reps each leg   Other Standing Exercises Pt performed alternate stepping up/back on incline/decline with CGA for safety     Pt performed crossovers in front inside // bars 10' x 2 reps with cues for correct sequence; pt then performed stepping behind 10' x 2 reps with cues for sequence - bil. UE support used with both front crossovers and stepping behind  Pt performed cone taps - touching tops of 3 cones and then tipping over and standing upright to improve SLS on each leg - UE support On counter needed with CGA also for safety         PT Long Term Goals - 09/07/16 1423      PT LONG TERM GOAL #1   Title Improve TUG score from 14.75 secs to </= 12.5 secs with no device for reduced fall risk.   (09-06-16)/ NEW TARGET 10-08-16  EXTEND UNMET LTG's til 10-08-16   Baseline 14.62 secs   Time 4   Period Weeks   Status On-going     PT LONG TERM GOAL #2   Title Incr. gait velocity to >/= 3.2 ft/sec with no device for incr. gait efficiency.  (09-06-16)/10-08-16   Baseline 11.13 secs = 2.95 ft/sec   Status On-going     PT LONG TERM GOAL #3   Title Pt will demonstrate increased strength in bil. LE's so she is able to perform sit to stand without UE support 5 times from 18" mat.  (09-06-16)   Baseline met 09-07-16   Status Achieved     PT LONG TERM GOAL #4   Title Amb. 240' with single point cane with SBA without LOB on flat, even surface.  (09-06-16)   Baseline met 09-07-16 -- no device needed   Status Achieved     PT LONG TERM GOAL #5   Title Independent in HEP for strengthening and balance exercises.   Status Achieved     Additional Long Term Goals   Additional Long Term Goals Yes     PT LONG TERM GOAL #6   Title Amb. 300' outside on uneven surface without device with min cues for arm swing with elbow extension.  10-08-16   Time 4   Period Weeks   Status New     PT LONG TERM GOAL #7   Title Transfer floor to stand with UE support with SBA.  10-08-16   Time 4   Period Weeks   Status New               Plan - 09/20/16 1150    Clinical Impression Statement Pt continues to hold UE's flexed at elbows but is able to relax arms and increase arm swing with verbal cues; pt also continues to amb. with wide BOS with decr. stride length.  Pt is progressing towards LTG's with balance improving.   Rehab Potential Good   PT Frequency 2x / week   PT Duration 4 weeks   PT Treatment/Interventions ADLs/Self Care Home Management;Functional mobility training;Stair training;Gait training;DME Instruction;Therapeutic activities;Therapeutic exercise;Balance training;Neuromuscular re-education;Patient/family education   PT Next Visit Plan cont gait and balance - activities to fascilitate arm swing  with elbow extension   PT Home Exercise Plan strengthening and balance exercises   Consulted and Agree with Plan of Care Patient  Patient will benefit from skilled therapeutic intervention in order to improve the following deficits and impairments:  Abnormal gait, Cardiopulmonary status limiting activity, Decreased activity tolerance, Decreased balance, Decreased mobility, Decreased strength, Decreased knowledge of use of DME, Decreased endurance, Dizziness  Visit Diagnosis: Other abnormalities of gait and mobility  Muscle weakness (generalized)     Problem List Patient Active Problem List   Diagnosis Date Noted  . Chest pain, atypical 09/23/2014  . Acute on chronic diastolic CHF (congestive heart failure), NYHA class 3 ([place]) 09/23/2014  . Acute diastolic heart failure ([place]) 09/23/2014  . Monocytosis 04/21/2014  . Neurofibromatosis ([place]) 04/21/2014  . Poor oral hygiene 04/21/2014  . Atrial fibrillation ([place]) 04/15/2014  . PSVT (paroxysmal supraventricular tachycardia) ([place]) 09/25/2013  . Syncope 09/10/2013  . Atrial flutter ([place]) 09/10/2013  . HTN (hypertension) 09/10/2013    Kunio Cummiskey, [place], PT 09/20/2016, 11:54 AM  [facility] [address] [place], [place], [address] Phone: [phone]   Fax:  [phone]  Name: Stephanie Simmons MRN: [id number] Date of Birth: May 28, 1941

## 2016-09-25 ENCOUNTER — Encounter: Payer: Self-pay | Admitting: Physical Therapy

## 2016-09-25 ENCOUNTER — Ambulatory Visit: Payer: Medicare Other | Attending: Family Medicine | Admitting: Physical Therapy

## 2016-09-25 DIAGNOSIS — R2689 Other abnormalities of gait and mobility: Secondary | ICD-10-CM | POA: Diagnosis not present

## 2016-09-25 DIAGNOSIS — R2681 Unsteadiness on feet: Secondary | ICD-10-CM | POA: Insufficient documentation

## 2016-09-25 DIAGNOSIS — M6281 Muscle weakness (generalized): Secondary | ICD-10-CM | POA: Diagnosis not present

## 2016-09-25 NOTE — Therapy (Signed)
[facility] [address] [place], [place], [address] Phone: [phone]   Fax:  [phone]  Physical Therapy Treatment  Patient Details  Name: Stephanie Simmons MRN: [id number] Date of Birth: [birth year] Referring Provider: Dr. Leighton Ruff  Encounter Date: 09/25/2016      PT End of Session - 09/25/16 1524    Visit Number 11  G3   Number of Visits 15   Date for PT Re-Evaluation 10/08/16   Authorization Type Medicare   Authorization Time Period 09-07-16  - 11-06-16   PT Start Time 1147   PT Stop Time 1228   PT Time Calculation (min) 41 min   Equipment Utilized During Treatment Gait belt      Past Medical History:  Diagnosis Date  . Chest pain    a. 04/2009 Cath: essentially nl cors, EF 65%, mod LVH.  Marland Kitchen GERD (gastroesophageal reflux disease)   . Hyperlipidemia   . Hypertension   . Hypothyroidism    does not take medication for this any longer per patient   . LVH (left ventricular hypertrophy)    a. 04/2009 Echo: EF 55-65%, mild conc LVH, no reg wma, Gr 1 DD, mild MR.  . Monocytosis 04/21/2014    Past Surgical History:  Procedure Laterality Date  . ABDOMINAL HYSTERECTOMY    . removal of mass- pleural      There were no vitals filed for this visit.      Subjective Assessment - 09/25/16 1150    Subjective Denies changes or near falls. Does some exercise every day.    Pertinent History PSVT, atrial fibrillation, HTN, syncope, CHF, chest pain, atypical, atrial flutter   Patient Stated Goals walk more steady, improve balance   Currently in Pain? Yes   Pain Score 3    Pain Location Knee   Pain Orientation Right;Left   Pain Descriptors / Indicators Aching;Sore   Pain Type Chronic pain   Pain Onset More than a month ago   Pain Frequency Intermittent                         [facility] Adult PT Treatment/Exercise - 09/25/16 1155      Transfers   Transfers Sit to Stand   Sit to Stand 5:  Supervision   Sit to Stand Details Verbal cues for technique;Verbal cues for sequencing   Number of Reps Other reps (comment)  10   Comments on blue AirEx foam pad - no UE support used     Ambulation/Gait   Ambulation/Gait Yes   Ambulation/Gait Assistance 5: Supervision   Ambulation Distance (Feet) 480 Feet  indoors; outdoors level 500 ft   Assistive device None   Gait Pattern Step-through pattern;Decreased arm swing - right;Decreased arm swing - left;Decreased step length - right;Decreased step length - left;Wide base of support   Ambulation Surface Level;Indoor;Outdoor     Knee/Hip Exercises: Aerobic   Nustep Level 5 x 4 min     Knee/Hip Exercises: Machines for Strengthening   Cybex Leg Press 40# bil. LE's 2 sets 10 reps  cues given for eccentric control     Knee/Hip Exercises: Standing   Heel Raises --   Forward Step Up --             Balance Exercises - 09/25/16 1518      Balance Exercises: Standing   SLS Eyes open;Upper extremity support 1;5 reps;10 secs  5x each leg; <10 sec if no UE support  SLS with Vectors Foam/compliant surface  lateral step single cone tap, double cone tap 12 ft each   Retro Gait Foam/compliant surface;2 reps  12 ft x 2   Marching Limitations on red mats (~12 ft x 2); no UE support; min assist for LOB   Other Standing Exercises Sideways walking on red mats ~12 ft x 2                PT Long Term Goals - 09/07/16 1423      PT LONG TERM GOAL #1   Title Improve TUG score from 14.75 secs to </= 12.5 secs with no device for reduced fall risk.  (09-06-16)/ NEW TARGET 10-08-16  EXTEND UNMET LTG's til 10-08-16   Baseline 14.62 secs   Time 4   Period Weeks   Status On-going     PT LONG TERM GOAL #2   Title Incr. gait velocity to >/= 3.2 ft/sec with no device for incr. gait efficiency.  (09-06-16)/10-08-16   Baseline 11.13 secs = 2.95 ft/sec   Status On-going     PT LONG TERM GOAL #3   Title Pt will demonstrate increased strength in  bil. LE's so she is able to perform sit to stand without UE support 5 times from 18" mat.  (09-06-16)   Baseline met 09-07-16   Status Achieved     PT LONG TERM GOAL #4   Title Amb. 240' with single point cane with SBA without LOB on flat, even surface.  (09-06-16)   Baseline met 09-07-16 -- no device needed   Status Achieved     PT LONG TERM GOAL #5   Title Independent in HEP for strengthening and balance exercises.   Status Achieved     Additional Long Term Goals   Additional Long Term Goals Yes     PT LONG TERM GOAL #6   Title Amb. 300' outside on uneven surface without device with min cues for arm swing with elbow extension.  10-08-16   Time 4   Period Weeks   Status New     PT LONG TERM GOAL #7   Title Transfer floor to stand with UE support with SBA.  10-08-16   Time 4   Period Weeks   Status New               Plan - 09/25/16 1525    Clinical Impression Statement Session focused on gait training (including outdoors on paved sidewalks), strengthening and balance activities. Patient continues to require cues for arm extension while walking. Will continue to benefit from PT to work towards [place].   Rehab Potential Good   PT Frequency 2x / week   PT Duration 4 weeks   PT Treatment/Interventions ADLs/Self Care Home Management;Functional mobility training;Stair training;Gait training;DME Instruction;Therapeutic activities;Therapeutic exercise;Balance training;Neuromuscular re-education;Patient/family education   PT Next Visit Plan cont gait and balance - activities to fascilitate arm swing with elbow extension   PT Home Exercise Plan strengthening and balance exercises   Consulted and Agree with Plan of Care Patient      Patient will benefit from skilled therapeutic intervention in order to improve the following deficits and impairments:  Abnormal gait, Cardiopulmonary status limiting activity, Decreased activity tolerance, Decreased balance, Decreased mobility, Decreased  strength, Decreased knowledge of use of DME, Decreased endurance, Dizziness  Visit Diagnosis: Other abnormalities of gait and mobility  Muscle weakness (generalized)  Unsteadiness on feet     Problem List Patient Active Problem List   Diagnosis Date Noted  .  Chest pain, atypical 09/23/2014  . Acute on chronic diastolic CHF (congestive heart failure), NYHA class 3 ([place]) 09/23/2014  . Acute diastolic heart failure ([place]) 09/23/2014  . Monocytosis 04/21/2014  . Neurofibromatosis ([place]) 04/21/2014  . Poor oral hygiene 04/21/2014  . Atrial fibrillation ([place]) 04/15/2014  . PSVT (paroxysmal supraventricular tachycardia) ([place]) 09/25/2013  . Syncope 09/10/2013  . Atrial flutter ([place]) 09/10/2013  . HTN (hypertension) 09/10/2013    Rexanne Mano, PT 09/25/2016, 3:28 PM  [facility] [address] [place], [place], [address] Phone: [phone]   Fax:  [phone]  Name: Gissel Keilman MRN: [id number] Date of Birth: [birth year]

## 2016-10-02 ENCOUNTER — Ambulatory Visit: Payer: Medicare Other | Admitting: Physical Therapy

## 2016-10-02 DIAGNOSIS — R2689 Other abnormalities of gait and mobility: Secondary | ICD-10-CM | POA: Diagnosis not present

## 2016-10-02 DIAGNOSIS — R2681 Unsteadiness on feet: Secondary | ICD-10-CM | POA: Diagnosis not present

## 2016-10-02 DIAGNOSIS — M6281 Muscle weakness (generalized): Secondary | ICD-10-CM

## 2016-10-03 NOTE — Therapy (Signed)
[facility] [address] [place], [place], [address] Phone: [phone]   Fax:  [phone]  Physical Therapy Treatment  Patient Details  Name: Stephanie Simmons MRN: [id number] Date of Birth: 1940-09-28 Referring Provider: Dr. Leighton Ruff  Encounter Date: 10/02/2016      PT End of Session - 10/03/16 1208    Visit Number 12  G4   Number of Visits 15   Date for PT Re-Evaluation 10/08/16   Authorization Type Medicare   Authorization Time Period 09-07-16  - 11-06-16   PT Start Time 1150   PT Stop Time 1232   PT Time Calculation (min) 42 min      Past Medical History:  Diagnosis Date  . Chest pain    a. 04/2009 Cath: essentially nl cors, EF 65%, mod LVH.  Marland Kitchen GERD (gastroesophageal reflux disease)   . Hyperlipidemia   . Hypertension   . Hypothyroidism    does not take medication for this any longer per patient   . LVH (left ventricular hypertrophy)    a. 04/2009 Echo: EF 55-65%, mild conc LVH, no reg wma, Gr 1 DD, mild MR.  . Monocytosis 04/21/2014    Past Surgical History:  Procedure Laterality Date  . ABDOMINAL HYSTERECTOMY    . removal of mass- pleural      There were no vitals filed for this visit.      Subjective Assessment - 10/03/16 1204    Subjective Pt reports no changes in status since PT session last week   Pertinent History PSVT, atrial fibrillation, HTN, syncope, CHF, chest pain, atypical, atrial flutter   Patient Stated Goals walk more steady, improve balance   Currently in Pain? Yes   Pain Score 3    Pain Location Knee   Pain Orientation Right;Left   Pain Descriptors / Indicators Aching;Sore   Pain Onset More than a month ago   Multiple Pain Sites No                         [facility] Adult PT Treatment/Exercise - 10/03/16 0001      Ambulation/Gait   Ambulation/Gait Yes   Ambulation/Gait Assistance 5: Supervision   Ambulation/Gait Assistance Details cues to  increase arm swing and to increase step length   Assistive device None   Gait Pattern Step-through pattern   Ambulation Surface Level;Indoor     Knee/Hip Exercises: Aerobic   Nustep Level 3 x 5" with UE's and LE's     Knee/Hip Exercises: Machines for Strengthening   Cybex Leg Press 40# bil. LE's 3 sets 10 reps  assistance given for eccentric control     Knee/Hip Exercises: Standing   Heel Raises Both;1 set;10 reps   Other Standing Knee Exercises Alternate stepping on incline with CGA 10 reps each leg             Balance Exercises - 10/03/16 1208      Balance Exercises: Standing   Stepping Strategy Anterior;Posterior;10 reps  on incline and then on decline   Rockerboard Anterior/posterior;30 seconds;EO   Sidestepping 1 rep           Step Over Hurdles / Cones stepping over balance beam inside // bars 10 reps each leg with minimal UE support   Other Standing Exercises Pt performed marching in place- lifting opposite arm up overhead for incr. corrdination     Rockerboard inside bars with UE support prn 20 reps with CGA  PT Long Term Goals - 09/07/16 1423      PT LONG TERM GOAL #1   Title Improve TUG score from 14.75 secs to </= 12.5 secs with no device for reduced fall risk.  (09-06-16)/ NEW TARGET 10-08-16  EXTEND UNMET LTG's til 10-08-16   Baseline 14.62 secs   Time 4   Period Weeks   Status On-going     PT LONG TERM GOAL #2   Title Incr. gait velocity to >/= 3.2 ft/sec with no device for incr. gait efficiency.  (09-06-16)/10-08-16   Baseline 11.13 secs = 2.95 ft/sec   Status On-going     PT LONG TERM GOAL #3   Title Pt will demonstrate increased strength in bil. LE's so she is able to perform sit to stand without UE support 5 times from 18" mat.  (09-06-16)   Baseline met 09-07-16   Status Achieved     PT LONG TERM GOAL #4   Title Amb. 240' with single point cane with SBA without LOB on flat, even surface.  (09-06-16)   Baseline met 09-07-16 -- no  device needed   Status Achieved     PT LONG TERM GOAL #5   Title Independent in HEP for strengthening and balance exercises.   Status Achieved     Additional Long Term Goals   Additional Long Term Goals Yes     PT LONG TERM GOAL #6   Title Amb. 300' outside on uneven surface without device with min cues for arm swing with elbow extension.  10-08-16   Time 4   Period Weeks   Status New     PT LONG TERM GOAL #7   Title Transfer floor to stand with UE support with SBA.  10-08-16   Time 4   Period Weeks   Status New               Plan - 10/03/16 1209    Clinical Impression Statement Pt continues to need cues to relax arms during gait as she holds bil. UE's flexed at elbows.  Pt able to increase step length with verbal cues also and decrease gait deviations.  Pt continues to have SLS deficits on bil. legs, with need for minimal UE support for safety with SLS activities.   Rehab Potential Good   PT Frequency 2x / week   PT Duration 4 weeks   PT Treatment/Interventions ADLs/Self Care Home Management;Functional mobility training;Stair training;Gait training;DME Instruction;Therapeutic activities;Therapeutic exercise;Balance training;Neuromuscular re-education;Patient/family education   PT Next Visit Plan cont gait and balance - activities to fascilitate arm swing with elbow extension   PT Home Exercise Plan strengthening and balance exercises   Consulted and Agree with Plan of Care Patient      Patient will benefit from skilled therapeutic intervention in order to improve the following deficits and impairments:  Abnormal gait, Cardiopulmonary status limiting activity, Decreased activity tolerance, Decreased balance, Decreased mobility, Decreased strength, Decreased knowledge of use of DME, Decreased endurance, Dizziness  Visit Diagnosis: Other abnormalities of gait and mobility  Muscle weakness (generalized)     Problem List Patient Active Problem List   Diagnosis Date  Noted  . Chest pain, atypical 09/23/2014  . Acute on chronic diastolic CHF (congestive heart failure), NYHA class 3 ([place]) 09/23/2014  . Acute diastolic heart failure ([place]) 09/23/2014  . Monocytosis 04/21/2014  . Neurofibromatosis ([place]) 04/21/2014  . Poor oral hygiene 04/21/2014  . Atrial fibrillation ([place]) 04/15/2014  . PSVT (paroxysmal supraventricular tachycardia) ([place]) 09/25/2013  .  Syncope 09/10/2013  . Atrial flutter ([place]) 09/10/2013  . HTN (hypertension) 09/10/2013    Shiara Mcgough, [place], PT 10/03/2016, 12:13 PM  [facility] [address] [place], [place], [address] Phone: [phone]   Fax:  [phone]  Name: Stephanie Simmons MRN: [id number] Date of Birth: January 15, 1941

## 2016-10-05 ENCOUNTER — Ambulatory Visit: Payer: Medicare Other | Admitting: Physical Therapy

## 2016-10-05 DIAGNOSIS — R2681 Unsteadiness on feet: Secondary | ICD-10-CM

## 2016-10-05 DIAGNOSIS — M6281 Muscle weakness (generalized): Secondary | ICD-10-CM

## 2016-10-05 DIAGNOSIS — R2689 Other abnormalities of gait and mobility: Secondary | ICD-10-CM

## 2016-10-06 NOTE — Therapy (Signed)
[facility] [address] [place], [place], [address] Phone: [phone]   Fax:  [phone]  Physical Therapy Treatment  Patient Details  Name: Stephanie Simmons MRN: [id number] Date of Birth: September 06, 1940 Referring Provider: Dr. Leighton Ruff  Encounter Date: 10/05/2016      PT End of Session - 10/06/16 1204    Visit Number 13  G5   Number of Visits 15   Date for PT Re-Evaluation 10/08/16   Authorization Type Medicare   Authorization Time Period 09-07-16  - 11-06-16   PT Start Time 1525   PT Stop Time 1613   PT Time Calculation (min) 48 min      Past Medical History:  Diagnosis Date  . Chest pain    a. 04/2009 Cath: essentially nl cors, EF 65%, mod LVH.  Marland Kitchen GERD (gastroesophageal reflux disease)   . Hyperlipidemia   . Hypertension   . Hypothyroidism    does not take medication for this any longer per patient   . LVH (left ventricular hypertrophy)    a. 04/2009 Echo: EF 55-65%, mild conc LVH, no reg wma, Gr 1 DD, mild MR.  . Monocytosis 04/21/2014    Past Surgical History:  Procedure Laterality Date  . ABDOMINAL HYSTERECTOMY    . removal of mass- pleural      There were no vitals filed for this visit.      Subjective Assessment - 10/06/16 1202    Subjective Pt reports she is "doing great today"   Pertinent History PSVT, atrial fibrillation, HTN, syncope, CHF, chest pain, atypical, atrial flutter   Patient Stated Goals walk more steady, improve balance   Currently in Pain? No/denies                         [facility] Adult PT Treatment/Exercise - 10/06/16 0001      Ambulation/Gait   Ambulation/Gait Yes   Ambulation/Gait Assistance 5: Supervision   Ambulation/Gait Assistance Details cues to incr. arm swing   Ambulation Distance (Feet) 300 Feet   Assistive device None   Gait Pattern Step-through pattern   Ambulation Surface Unlevel;Outdoor;Paved     Knee/Hip Exercises: Aerobic   Nustep Level 4 x 6" with UE's and LE's     Knee/Hip Exercises: Machines for Strengthening   Cybex Leg Press 40# bil. LE's 3 sets 10 reps  assistance given for eccentric control     Knee/Hip Exercises: Standing   Heel Raises Both;1 set;10 reps      NeuroRe-ed;  Pt performed cone taps with min assist for balance recovery - on floor; also tipping cones over and standing Upright with min assist  Pt performed amb. Tossing ball for multi-tasking with gait - CGA   Marching in place - lifting opposite UE 5 reps each side with visual cues - to improve SLS and coordination  Pt performed kicking bean bags to improve SLS and coordination -20' x 2 reps with CGA for safety               PT Long Term Goals - 09/07/16 1423      PT LONG TERM GOAL #1   Title Improve TUG score from 14.75 secs to </= 12.5 secs with no device for reduced fall risk.  (09-06-16)/ NEW TARGET 10-08-16  EXTEND UNMET LTG's til 10-08-16   Baseline 14.62 secs   Time 4   Period Weeks   Status On-going     PT LONG TERM GOAL #2  Title Incr. gait velocity to >/= 3.2 ft/sec with no device for incr. gait efficiency.  (09-06-16)/10-08-16   Baseline 11.13 secs = 2.95 ft/sec   Status On-going     PT LONG TERM GOAL #3   Title Pt will demonstrate increased strength in bil. LE's so she is able to perform sit to stand without UE support 5 times from 18" mat.  (09-06-16)   Baseline met 09-07-16   Status Achieved     PT LONG TERM GOAL #4   Title Amb. 240' with single point cane with SBA without LOB on flat, even surface.  (09-06-16)   Baseline met 09-07-16 -- no device needed   Status Achieved     PT LONG TERM GOAL #5   Title Independent in HEP for strengthening and balance exercises.   Status Achieved     Additional Long Term Goals   Additional Long Term Goals Yes     PT LONG TERM GOAL #6   Title Amb. 300' outside on uneven surface without device with min cues for arm swing with elbow extension.  10-08-16   Time 4    Period Weeks   Status New     PT LONG TERM GOAL #7   Title Transfer floor to stand with UE support with SBA.  10-08-16   Time 4   Period Weeks   Status New               Plan - 10/06/16 1205    Clinical Impression Statement Pt progressing well towards LTG's - no LOB occurred with gait training outside on pavement - pt declined amb. on grassy surface   Rehab Potential Good   PT Frequency 2x / week   PT Duration 4 weeks   PT Treatment/Interventions ADLs/Self Care Home Management;Functional mobility training;Stair training;Gait training;DME Instruction;Therapeutic activities;Therapeutic exercise;Balance training;Neuromuscular re-education;Patient/family education   PT Next Visit Plan cont gait and balance - activities to fascilitate arm swing with elbow extension - plan D/C next week   PT Home Exercise Plan strengthening and balance exercises   Consulted and Agree with Plan of Care Patient;Family member/caregiver   Family Member Consulted sister Vernice      Patient will benefit from skilled therapeutic intervention in order to improve the following deficits and impairments:  Abnormal gait, Cardiopulmonary status limiting activity, Decreased activity tolerance, Decreased balance, Decreased mobility, Decreased strength, Decreased knowledge of use of DME, Decreased endurance, Dizziness  Visit Diagnosis: Other abnormalities of gait and mobility  Unsteadiness on feet  Muscle weakness (generalized)     Problem List Patient Active Problem List   Diagnosis Date Noted  . Chest pain, atypical 09/23/2014  . Acute on chronic diastolic CHF (congestive heart failure), NYHA class 3 ([place]) 09/23/2014  . Acute diastolic heart failure ([place]) 09/23/2014  . Monocytosis 04/21/2014  . Neurofibromatosis ([place]) 04/21/2014  . Poor oral hygiene 04/21/2014  . Atrial fibrillation ([place]) 04/15/2014  . PSVT (paroxysmal supraventricular tachycardia) ([place]) 09/25/2013  . Syncope 09/10/2013  . Atrial  flutter ([place]) 09/10/2013  . HTN (hypertension) 09/10/2013    Arabia Nylund, [place], PT 10/06/2016, 1:11 PM  [facility] [address] [place], [place], [address] Phone: [phone]   Fax:  [phone]  Name: Stephanie Simmons MRN: [id number] Date of Birth: 07-30-1940

## 2016-10-09 ENCOUNTER — Ambulatory Visit: Payer: Medicare Other | Admitting: Physical Therapy

## 2016-10-12 ENCOUNTER — Ambulatory Visit: Payer: Medicare Other | Admitting: Physical Therapy

## 2016-10-12 DIAGNOSIS — R2689 Other abnormalities of gait and mobility: Secondary | ICD-10-CM

## 2016-10-12 DIAGNOSIS — M6281 Muscle weakness (generalized): Secondary | ICD-10-CM | POA: Diagnosis not present

## 2016-10-12 DIAGNOSIS — R2681 Unsteadiness on feet: Secondary | ICD-10-CM | POA: Diagnosis not present

## 2016-10-13 ENCOUNTER — Ambulatory Visit: Payer: Medicare Other | Admitting: Physical Therapy

## 2016-10-13 DIAGNOSIS — M6281 Muscle weakness (generalized): Secondary | ICD-10-CM | POA: Diagnosis not present

## 2016-10-13 DIAGNOSIS — R2681 Unsteadiness on feet: Secondary | ICD-10-CM | POA: Diagnosis not present

## 2016-10-13 DIAGNOSIS — R2689 Other abnormalities of gait and mobility: Secondary | ICD-10-CM | POA: Diagnosis not present

## 2016-10-13 NOTE — Therapy (Signed)
[facility] [address] [place], [place], [address] Phone: [phone]   Fax:  [phone]  Physical Therapy Treatment  Patient Details  Name: Stephanie Simmons MRN: [id number] Date of Birth: 1940-07-04 Referring Provider: Dr. Leighton Ruff  Encounter Date: 10/12/2016      PT End of Session - 10/13/16 1051    Visit Number 14  G6   Number of Visits 15   Date for PT Re-Evaluation 10/08/16   Authorization Type Medicare   Authorization Time Period 09-07-16  - 11-06-16   PT Start Time 1524   PT Stop Time 1612   PT Time Calculation (min) 48 min      Past Medical History:  Diagnosis Date  . Chest pain    a. 04/2009 Cath: essentially nl cors, EF 65%, mod LVH.  Marland Kitchen GERD (gastroesophageal reflux disease)   . Hyperlipidemia   . Hypertension   . Hypothyroidism    does not take medication for this any longer per patient   . LVH (left ventricular hypertrophy)    a. 04/2009 Echo: EF 55-65%, mild conc LVH, no reg wma, Gr 1 DD, mild MR.  . Monocytosis 04/21/2014    Past Surgical History:  Procedure Laterality Date  . ABDOMINAL HYSTERECTOMY    . removal of mass- pleural      There were no vitals filed for this visit.      Subjective Assessment - 10/13/16 1047    Subjective "I'm doing good today"   Pertinent History PSVT, atrial fibrillation, HTN, syncope, CHF, chest pain, atypical, atrial flutter   Patient Stated Goals walk more steady, improve balance   Currently in Pain? No/denies                         [facility] Adult PT Treatment/Exercise - 10/13/16 0001      Ambulation/Gait   Ambulation/Gait Yes   Ambulation/Gait Assistance 5: Supervision   Ambulation/Gait Assistance Details cues to increase arm swing   Ambulation Distance (Feet) 350 Feet   Assistive device None   Gait Pattern Step-through pattern   Ambulation Surface Level;Unlevel;Indoor;Outdoor;Paved   Stairs Yes   Stairs Assistance  5: Supervision   Stair Management Technique One rail Right;Alternating pattern;Step to pattern   Number of Stairs 4   Height of Stairs 6     Knee/Hip Exercises: Aerobic   Recumbent Bike SciFit level 1.5 x 4" with UE's and LE's     Knee/Hip Exercises: Machines for Strengthening   Cybex Leg Press 40# bil. LE's 3 sets 10 reps  assistance given for eccentric control     Knee/Hip Exercises: Standing   Heel Raises Both;1 set;10 reps   Forward Step Up Both;1 set;10 reps;Hand Hold: 1             Balance Exercises - 10/13/16 1049      Balance Exercises: Standing   SLS with Vectors Foam/compliant surface  lateral step single cone tap, double cone tap 12 ft each   Rockerboard Anterior/posterior;30 seconds;EO   Other Standing Exercises sideways amb. inside // bars;  crossovers front and back 10' x 4 reps inside // bars with SBA                PT Long Term Goals - 10/12/16 1529      PT LONG TERM GOAL #3   Title Pt will demonstrate increased strength in bil. LE's so she is able to perform sit to stand without UE support  5 times from 18" mat.  (09-06-16)   Status Achieved     PT LONG TERM GOAL #6   Title Amb. 300' outside on uneven surface without device with min cues for arm swing with elbow extension.  10-08-16     PT LONG TERM GOAL #7   Title Transfer floor to stand with UE support with SBA.  10-08-16   Status Achieved               Plan - 10/13/16 1051    Clinical Impression Statement Pt continues to need cues to relax arms at elbows to increase arm swing; progressing well towards LTG's - no LOB with amb. outside; plan D/C next session   Rehab Potential Good   PT Frequency 2x / week   PT Duration 4 weeks   PT Treatment/Interventions ADLs/Self Care Home Management;Functional mobility training;Stair training;Gait training;DME Instruction;Therapeutic activities;Therapeutic exercise;Balance training;Neuromuscular re-education;Patient/family education   PT Next Visit  Plan cont gait and balance - activities to fascilitate arm swing with elbow extension - plan D/C next week   PT Home Exercise Plan strengthening and balance exercises   Consulted and Agree with Plan of Care Patient      Patient will benefit from skilled therapeutic intervention in order to improve the following deficits and impairments:  Abnormal gait, Cardiopulmonary status limiting activity, Decreased activity tolerance, Decreased balance, Decreased mobility, Decreased strength, Decreased knowledge of use of DME, Decreased endurance, Dizziness  Visit Diagnosis: Other abnormalities of gait and mobility  Muscle weakness (generalized)     Problem List Patient Active Problem List   Diagnosis Date Noted  . Chest pain, atypical 09/23/2014  . Acute on chronic diastolic CHF (congestive heart failure), NYHA class 3 ([place]) 09/23/2014  . Acute diastolic heart failure ([place]) 09/23/2014  . Monocytosis 04/21/2014  . Neurofibromatosis ([place]) 04/21/2014  . Poor oral hygiene 04/21/2014  . Atrial fibrillation ([place]) 04/15/2014  . PSVT (paroxysmal supraventricular tachycardia) ([place]) 09/25/2013  . Syncope 09/10/2013  . Atrial flutter ([place]) 09/10/2013  . HTN (hypertension) 09/10/2013    Muzammil Bruins, [place], PT 10/13/2016, 10:54 AM  [facility] [address] [place], [place], [address] Phone: [phone]   Fax:  [phone]  Name: Stephanie Simmons MRN: [id number] Date of Birth: June 08, 1941

## 2016-10-15 NOTE — Therapy (Signed)
[facility] [address] [place], [place], [address] Phone: [phone]   Fax:  [phone]  Physical Therapy Treatment  Patient Details  Name: Stephanie Simmons MRN: [id number] Date of Birth: [birth year] Referring Provider: Dr. Leighton Ruff  Encounter Date: 10/13/2016      PT End of Session - 10/15/16 1945    Visit Number 34  G7   Number of Visits 15   Date for PT Re-Evaluation 10/08/16   Authorization Type Medicare   Authorization Time Period 09-07-16  - 11-06-16   PT Start Time 1146   PT Stop Time 1233   PT Time Calculation (min) 47 min      Past Medical History:  Diagnosis Date  . Chest pain    a. 04/2009 Cath: essentially nl cors, EF 65%, mod LVH.  Marland Kitchen GERD (gastroesophageal reflux disease)   . Hyperlipidemia   . Hypertension   . Hypothyroidism    does not take medication for this any longer per patient   . LVH (left ventricular hypertrophy)    a. 04/2009 Echo: EF 55-65%, mild conc LVH, no reg wma, Gr 1 DD, mild MR.  . Monocytosis 04/21/2014    Past Surgical History:  Procedure Laterality Date  . ABDOMINAL HYSTERECTOMY    . removal of mass- pleural      There were no vitals filed for this visit.      Subjective Assessment - 10/15/16 1942    Subjective Pt reports she is ready to finish up today   Pertinent History PSVT, atrial fibrillation, HTN, syncope, CHF, chest pain, atypical, atrial flutter   Patient Stated Goals walk more steady, improve balance   Currently in Pain? No/denies                         [facility] Adult PT Treatment/Exercise - 10/15/16 0001      Ambulation/Gait   Ambulation/Gait Yes   Ambulation/Gait Assistance 5: Supervision   Ambulation Distance (Feet) 250 Feet   Assistive device None   Gait Pattern Step-through pattern   Ambulation Surface Level;Indoor     Knee/Hip Exercises: Aerobic   Nustep Level 4 x 5" with UE's and LE's     Knee/Hip Exercises:  Machines for Strengthening   Cybex Leg Press 40# bil. LE's 3 sets 10 reps  assistance given for eccentric control     Knee/Hip Exercises: Standing   Heel Raises Both;1 set;10 reps   Forward Step Up Both;1 set;10 reps;Hand Hold: 1     NeuroRe-ed;  Pt performed cone taps with min assist for balance recovery - on floor; also tipping cones over and standing Upright with min assist  Pt performed amb. Tossing ball for multi-tasking with gait - CGA   Marching in place - lifting opposite UE 5 reps each side with visual cues - to improve SLS and coordination              PT Long Term Goals - 10/13/16 1220      PT LONG TERM GOAL #1   Title Improve TUG score from 14.75 secs to </= 12.5 secs with no device for reduced fall risk.  (09-06-16)/ NEW TARGET 10-08-16   Baseline 14.62 secs = 13.06 secs on 10-13-16   Time 4   Period Weeks   Status Not Met     PT LONG TERM GOAL #2   Title Incr. gait velocity to >/= 3.2 ft/sec with no device for incr. gait efficiency.  (  09-06-16)/10-08-16   Baseline 11.13 secs = 2.95 ft/sec;  11.35 secs    Time 4   Period Weeks   Status Not Met     PT LONG TERM GOAL #3   Title Pt will demonstrate increased strength in bil. LE's so she is able to perform sit to stand without UE support 5 times from 18" mat.  (09-06-16)   Baseline met 10-12-16   Time 4   Period Weeks   Status Achieved     PT LONG TERM GOAL #4   Title Amb. 240' with single point cane with SBA without LOB on flat, even surface.  (09-06-16)   Baseline met 09-07-16 -- no device needed   Status Achieved     PT LONG TERM GOAL #5   Title Independent in HEP for strengthening and balance exercises.   Baseline met 10-13-16   Time 4   Period Weeks   Status Achieved     PT LONG TERM GOAL #6   Title Amb. 300' outside on uneven surface without device with min cues for arm swing with elbow extension.  10-08-16   Baseline met 10-12-16   Time 4   Period Weeks   Status Achieved     PT LONG TERM GOAL #7    Title Transfer floor to stand with UE support with SBA.  10-08-16   Time 4   Period Weeks   Status Achieved               Plan - 10/15/16 1948    Clinical Impression Statement Pt has met all LTG's except for #1-2 which have not been met due to decr. gait speed.  Other LTG's have been achieved.  Pt is discharged due to completion of program and majority of goals achieved.  Pt has maximized functional progress at this time.   Rehab Potential Good   PT Frequency 2x / week   PT Duration 4 weeks   PT Treatment/Interventions ADLs/Self Care Home Management;Functional mobility training;Stair training;Gait training;DME Instruction;Therapeutic activities;Therapeutic exercise;Balance training;Neuromuscular re-education;Patient/family education   PT Next Visit Plan N/A - D/C   PT Home Exercise Plan strengthening and balance exercises   Consulted and Agree with Plan of Care Patient   Family Member Consulted sister Vernice      Patient will benefit from skilled therapeutic intervention in order to improve the following deficits and impairments:  Abnormal gait, Cardiopulmonary status limiting activity, Decreased activity tolerance, Decreased balance, Decreased mobility, Decreased strength, Decreased knowledge of use of DME, Decreased endurance, Dizziness  Visit Diagnosis: Other abnormalities of gait and mobility  Muscle weakness (generalized)     Problem List Patient Active Problem List   Diagnosis Date Noted  . Chest pain, atypical 09/23/2014  . Acute on chronic diastolic CHF (congestive heart failure), NYHA class 3 ([place]) 09/23/2014  . Acute diastolic heart failure ([place]) 09/23/2014  . Monocytosis 04/21/2014  . Neurofibromatosis ([place]) 04/21/2014  . Poor oral hygiene 04/21/2014  . Atrial fibrillation ([place]) 04/15/2014  . PSVT (paroxysmal supraventricular tachycardia) ([place]) 09/25/2013  . Syncope 09/10/2013  . Atrial flutter ([place]) 09/10/2013  . HTN (hypertension) 09/10/2013      PHYSICAL THERAPY DISCHARGE SUMMARY  Visits from Start of Care:  15  Current functional level related to goals / functional outcomes: See above for progress towards goals - all LTG's met except #1 and 2   Remaining deficits: Continued decr. High level balance skills   Education / Equipment: Pt has been instructed in a HEP consisting of balance  exercises.  Plan: Patient agrees to discharge.  Patient goals were partially met. Patient is being discharged due to meeting the stated rehab goals.  ?????        Alda Lea, PT 10/15/2016, 7:56 PM  [facility] [address] [place], [place], [address] Phone: [phone]   Fax:  [phone]  Name: Allia Wiltsey MRN: [id number] Date of Birth: 02-07-1941

## 2016-10-24 ENCOUNTER — Ambulatory Visit (INDEPENDENT_AMBULATORY_CARE_PROVIDER_SITE_OTHER): Payer: Medicare Other | Admitting: Internal Medicine

## 2016-10-24 ENCOUNTER — Encounter: Payer: Self-pay | Admitting: Internal Medicine

## 2016-10-24 VITALS — BP 134/82 | HR 76 | Ht 62.0 in | Wt 122.4 lb

## 2016-10-24 DIAGNOSIS — R5383 Other fatigue: Secondary | ICD-10-CM | POA: Diagnosis not present

## 2016-10-24 DIAGNOSIS — I482 Chronic atrial fibrillation, unspecified: Secondary | ICD-10-CM

## 2016-10-24 NOTE — Progress Notes (Signed)
HPI Stephanie Simmons returns today for followup. She is a pleasant 76 yo woman with a h/o atrial tachycardia and fibrillation with a RVR. She has developed chronic atrial fib. She is now on a strategy of rate control. She is sedentary. She has rare palpitations, particularly with exertion. She has not had syncope. She has class 2 CHF. When I last saw her, we added a beta blocker to her calcium channel regimen. She has been sedentary but her breathing is improved. No syncope.    No Known Allergies   Current Outpatient Prescriptions  Medication Sig Dispense Refill  . acetaminophen (TYLENOL) 500 MG tablet Take 500 mg by mouth every 6 (six) hours as needed (pain).    Marland Kitchen atorvastatin (LIPITOR) 10 MG tablet TAKE 1 TABLET (10 MG TOTAL) BY MOUTH EVERY EVENING. 30 tablet 11  . bimatoprost (LUMIGAN) 0.01 % SOLN Place 1 drop into both eyes at bedtime.    . brinzolamide (AZOPT) 1 % ophthalmic suspension Place 1 drop into both eyes 3 (three) times daily.    . chlorhexidine (PERIDEX) 0.12 % solution Use as directed 15 mLs in the mouth or throat 2 (two) times daily.    Marland Kitchen diltiazem (CARDIZEM CD) 240 MG 24 hr capsule Take 240 mg by mouth daily.    Marland Kitchen ELIQUIS 5 MG TABS tablet TAKE 1 TABLET BY MOUTH TWICE A DAY 60 tablet 9  . enalapril (VASOTEC) 10 MG tablet Take 1 tablet (10 mg total) by mouth daily. 30 tablet 3  . Esomeprazole Magnesium (NEXIUM 24HR) 20 MG TBEC Take 20 mg by mouth as directed.     . fluticasone (FLONASE ALLERGY RELIEF) 50 MCG/ACT nasal spray Place 1 spray into both nostrils daily.     . furosemide (LASIX) 40 MG tablet Take 40 mg by mouth daily.    . metoprolol succinate (TOPROL XL) 25 MG 24 hr tablet Take 0.5 tablets (12.5 mg total) by mouth daily. 15 tablet 4  . Polyethyl Glycol-Propyl Glycol (SYSTANE OP) 1 drop See admin instructions. Once a day into affected eye(s) as needed    . potassium chloride SA (KLOR-CON M20) 20 MEQ tablet Take 2 tablets (40 mEq total) by mouth 2 (two) times daily.  360 tablet 3  . Sodium Chloride, Hypertonic, (MURO 128 OP) 1 drop See admin instructions. Into affected eye(s) every 4 hours as needed     No current facility-administered medications for this visit.      Past Medical History:  Diagnosis Date  . Chest pain    a. 04/2009 Cath: essentially nl cors, EF 65%, mod LVH.  Marland Kitchen GERD (gastroesophageal reflux disease)   . Hyperlipidemia   . Hypertension   . Hypothyroidism    does not take medication for this any longer per patient   . LVH (left ventricular hypertrophy)    a. 04/2009 Echo: EF 55-65%, mild conc LVH, no reg wma, Gr 1 DD, mild MR.  . Monocytosis 04/21/2014    ROS:   All systems reviewed and negative except as noted in the HPI.   Past Surgical History:  Procedure Laterality Date  . ABDOMINAL HYSTERECTOMY    . removal of mass- pleural       Family History  Problem Relation Age of Onset  . Other Mother     s/p PPM, died @ 36  . Heart attack Father     died @ 53.  . Other Father     enlarged heart   . Stroke Sister  Social History   Social History  . Marital status: Single    Spouse name: N/A  . Number of children: N/A  . Years of education: N/A   Occupational History  . Not on file.   Social History Main Topics  . Smoking status: Former Smoker    Types: Cigarettes    Quit date: 06/26/1992  . Smokeless tobacco: Never Used  . Alcohol use No  . Drug use: No  . Sexual activity: Not on file   Other Topics Concern  . Not on file   Social History Narrative   Lives in [place] by herself.     BP 134/82 (BP Location: Left Arm)   Pulse 76   Ht 5\' 2"  (1.575 m)   Wt 122 lb 6.4 oz (55.5 kg)   BMI 22.39 kg/m   Physical Exam:  Well appearing 76 yo woman, NAD HEENT: Unremarkable Neck:  6 cm JVD, no thyromegally Back:  No CVA tenderness Lungs:  Clear with no wheezes HEART:  IRegular rate rhythm, no murmurs, no rubs, no clicks Abd:  soft, positive bowel sounds, no organomegally, no rebound, no  guarding Ext:  2 plus pulses, no edema, no cyanosis, no clubbing Skin:  No rashes no nodules Neuro:  CN II through XII intact, motor grossly intact  EKG - atrial fibrillation with a CVR   Assess/Plan: 1. Atrial fib with a RVR - her rate appears to be well controlled.I have recommended she continue her current meds as prescribed 2. Chronic diastolic heart failure - her symptoms are class 2. She will continue her current meds except I have asked her to reduce her dose of lasix. I have asked her to reduce her salt intake. 3. HTN - her blood pressure appears to be well controlled. Will follow. 4. Falls/weakness - this appears to have improved. No indication for ILR at this time.   Mikle Bosworth.D.

## 2016-10-24 NOTE — Patient Instructions (Signed)

## 2016-11-14 DIAGNOSIS — H401133 Primary open-angle glaucoma, bilateral, severe stage: Secondary | ICD-10-CM | POA: Diagnosis not present

## 2016-11-28 DIAGNOSIS — Z79899 Other long term (current) drug therapy: Secondary | ICD-10-CM | POA: Diagnosis not present

## 2016-11-28 DIAGNOSIS — I10 Essential (primary) hypertension: Secondary | ICD-10-CM | POA: Diagnosis not present

## 2016-11-28 DIAGNOSIS — E042 Nontoxic multinodular goiter: Secondary | ICD-10-CM | POA: Diagnosis not present

## 2016-11-28 DIAGNOSIS — E782 Mixed hyperlipidemia: Secondary | ICD-10-CM | POA: Diagnosis not present

## 2016-11-28 DIAGNOSIS — R609 Edema, unspecified: Secondary | ICD-10-CM | POA: Diagnosis not present

## 2016-11-28 DIAGNOSIS — E559 Vitamin D deficiency, unspecified: Secondary | ICD-10-CM | POA: Diagnosis not present

## 2016-11-28 DIAGNOSIS — I4891 Unspecified atrial fibrillation: Secondary | ICD-10-CM | POA: Diagnosis not present

## 2016-11-28 DIAGNOSIS — F419 Anxiety disorder, unspecified: Secondary | ICD-10-CM | POA: Diagnosis not present

## 2016-11-28 DIAGNOSIS — I509 Heart failure, unspecified: Secondary | ICD-10-CM | POA: Diagnosis not present

## 2016-12-01 ENCOUNTER — Other Ambulatory Visit: Payer: Self-pay | Admitting: Family Medicine

## 2016-12-01 DIAGNOSIS — E042 Nontoxic multinodular goiter: Secondary | ICD-10-CM

## 2016-12-05 ENCOUNTER — Ambulatory Visit
Admission: RE | Admit: 2016-12-05 | Discharge: 2016-12-05 | Disposition: A | Discharge status: Home / Self Care | Payer: Medicare Other | Source: Ambulatory Visit | Attending: Family Medicine | Admitting: Family Medicine

## 2016-12-05 DIAGNOSIS — E042 Nontoxic multinodular goiter: Secondary | ICD-10-CM

## 2017-01-01 ENCOUNTER — Other Ambulatory Visit: Payer: Self-pay | Admitting: Internal Medicine

## 2017-01-01 DIAGNOSIS — I482 Chronic atrial fibrillation, unspecified: Secondary | ICD-10-CM

## 2017-01-15 ENCOUNTER — Other Ambulatory Visit: Payer: Self-pay | Admitting: Internal Medicine

## 2017-01-17 DIAGNOSIS — E049 Nontoxic goiter, unspecified: Secondary | ICD-10-CM | POA: Diagnosis not present

## 2017-01-17 DIAGNOSIS — E063 Autoimmune thyroiditis: Secondary | ICD-10-CM | POA: Diagnosis not present

## 2017-02-13 DIAGNOSIS — H401133 Primary open-angle glaucoma, bilateral, severe stage: Secondary | ICD-10-CM | POA: Diagnosis not present

## 2017-02-27 ENCOUNTER — Other Ambulatory Visit: Payer: Self-pay | Admitting: Family Medicine

## 2017-02-27 DIAGNOSIS — Z1231 Encounter for screening mammogram for malignant neoplasm of breast: Secondary | ICD-10-CM

## 2017-03-23 ENCOUNTER — Ambulatory Visit
Admission: RE | Admit: 2017-03-23 | Discharge: 2017-03-23 | Disposition: A | Discharge status: Home / Self Care | Payer: Medicare Other | Source: Ambulatory Visit | Attending: Family Medicine | Admitting: Family Medicine

## 2017-03-23 DIAGNOSIS — Z1231 Encounter for screening mammogram for malignant neoplasm of breast: Secondary | ICD-10-CM

## 2017-04-05 ENCOUNTER — Other Ambulatory Visit: Payer: Self-pay | Admitting: Internal Medicine

## 2017-04-25 ENCOUNTER — Ambulatory Visit (INDEPENDENT_AMBULATORY_CARE_PROVIDER_SITE_OTHER): Payer: Medicare Other | Admitting: Internal Medicine

## 2017-04-25 ENCOUNTER — Encounter: Payer: Self-pay | Admitting: Internal Medicine

## 2017-04-25 VITALS — BP 122/80 | HR 88 | Ht 62.0 in | Wt 123.0 lb

## 2017-04-25 DIAGNOSIS — I482 Chronic atrial fibrillation, unspecified: Secondary | ICD-10-CM

## 2017-04-25 DIAGNOSIS — Z23 Encounter for immunization: Secondary | ICD-10-CM | POA: Diagnosis not present

## 2017-04-25 NOTE — Patient Instructions (Signed)
Medication Instructions:  Your physician recommends that you continue on your current medications as directed. Please refer to the Current Medication list given to you today.  Labwork: None ordered.  Testing/Procedures: None ordered.  Follow-Up: Your physician wants you to follow-up in: one year with Dr. [name].   You will receive a reminder letter in the mail two months in advance. If you don't receive a letter, please call our office to schedule the follow-up appointment.   Any Other Special Instructions Will Be Listed Below (If Applicable).  You got your influenza vaccine today.   If you need a refill on your cardiac medications before your next appointment, please call your pharmacy.

## 2017-04-25 NOTE — Progress Notes (Signed)
HPI Stephanie Simmons returns today for ongoing evaluation and management of her atrial fib and HTN. She is a pleasant 76 yo woman with a h/o as above. In the past, she has had very difficult to control atrial fib but her VR's have improved over the years. In the interim, she denies syncope, sob or chest pain. She has been compliant with her meds. No Known Allergies   Current Outpatient Prescriptions  Medication Sig Dispense Refill  . acetaminophen (TYLENOL) 500 MG tablet Take 500 mg by mouth every 6 (six) hours as needed (pain).    Marland Kitchen atorvastatin (LIPITOR) 10 MG tablet TAKE 1 TABLET (10 MG TOTAL) BY MOUTH EVERY EVENING. 30 tablet 11  . bimatoprost (LUMIGAN) 0.01 % SOLN Place 1 drop into both eyes at bedtime.    . brinzolamide (AZOPT) 1 % ophthalmic suspension Place 1 drop into both eyes 3 (three) times daily.    Marland Kitchen diltiazem (CARDIZEM CD) 240 MG 24 hr capsule TAKE 1 CAPSULE (240 MG TOTAL) BY MOUTH DAILY. 30 capsule 9  . ELIQUIS 5 MG TABS tablet TAKE 1 TABLET BY MOUTH TWICE A DAY 60 tablet 5  . enalapril (VASOTEC) 10 MG tablet Take 10 mg by mouth. Take 1 1/2 tablets by mouth dailyt    . Esomeprazole Magnesium (NEXIUM 24HR) 20 MG TBEC Take 20 mg by mouth as directed.     . fluticasone (FLONASE ALLERGY RELIEF) 50 MCG/ACT nasal spray Place 1 spray into both nostrils daily.     . furosemide (LASIX) 40 MG tablet Take 1 tablet (40 mg total) by mouth daily. 30 tablet 7  . metoprolol succinate (TOPROL-XL) 25 MG 24 hr tablet TAKE 1 TABLET (25 MG TOTAL) BY MOUTH DAILY. TAKE WITH OR IMMEDIATELY FOLLOWING A MEAL. 30 tablet 9  . Polyethyl Glycol-Propyl Glycol (SYSTANE OP) 1 drop See admin instructions. Once a day into affected eye(s) as needed    . potassium chloride SA (KLOR-CON M20) 20 MEQ tablet Take 2 tablets (40 mEq total) by mouth 2 (two) times daily. 360 tablet 3  . Sodium Chloride, Hypertonic, (MURO 128 OP) 1 drop See admin instructions. Into affected eye(s) every 4 hours as needed    .  chlorhexidine (PERIDEX) 0.12 % solution Use as directed 15 mLs in the mouth or throat 2 (two) times daily.     No current facility-administered medications for this visit.      Past Medical History:  Diagnosis Date  . Chest pain    a. 04/2009 Cath: essentially nl cors, EF 65%, mod LVH.  Marland Kitchen GERD (gastroesophageal reflux disease)   . Hyperlipidemia   . Hypertension   . Hypothyroidism    does not take medication for this any longer per patient   . LVH (left ventricular hypertrophy)    a. 04/2009 Echo: EF 55-65%, mild conc LVH, no reg wma, Gr 1 DD, mild MR.  . Monocytosis 04/21/2014    ROS:   All systems reviewed and negative except as noted in the HPI.   Past Surgical History:  Procedure Laterality Date  . ABDOMINAL HYSTERECTOMY    . removal of mass- pleural       Family History  Problem Relation Age of Onset  . Other Mother        s/p PPM, died @ 76  . Heart attack Father        died @ 2.  . Other Father        enlarged heart   . Stroke  Sister      Social History   Social History  . Marital status: Single    Spouse name: N/A  . Number of children: N/A  . Years of education: N/A   Occupational History  . Not on file.   Social History Main Topics  . Smoking status: Former Smoker    Types: Cigarettes    Quit date: 06/26/1992  . Smokeless tobacco: Never Used  . Alcohol use No  . Drug use: No  . Sexual activity: Not on file   Other Topics Concern  . Not on file   Social History Narrative   Lives in [place] by herself.     BP 122/80   Pulse 88   Ht 5\' 2"  (1.575 m)   Wt 123 lb (55.8 kg)   BMI 22.50 kg/m   Physical Exam:  Well appearing 76 yo woman, NAD HEENT: Unremarkable Neck:  6 cm JVD, no thyromegally Lymphatics:  No adenopathy Back:  No CVA tenderness Lungs:  Clear with no wheezes HEART:  IRegular rate rhythm, no murmurs, no rubs, no clicks Abd:  soft, positive bowel sounds, no organomegally, no rebound, no guarding Ext:  2 plus pulses, no  edema, no cyanosis, no clubbing Skin:  No rashes no nodules Neuro:  CN II through XII intact, motor grossly intact  EKG - atrial fib with a controlled VR  Assess/Plan: 1. Atrial fib - her ventricular rates are controlled and she is asymptomatic. She will continue her current meds. 2. HTN - her blood pressure has been controlled. She will continue her current meds. 3. Coags - she will continue her anti-coagulation.  Mikle Bosworth.D.

## 2017-05-15 DIAGNOSIS — H401133 Primary open-angle glaucoma, bilateral, severe stage: Secondary | ICD-10-CM | POA: Diagnosis not present

## 2017-06-11 ENCOUNTER — Other Ambulatory Visit: Payer: Self-pay | Admitting: Internal Medicine

## 2017-06-11 DIAGNOSIS — I482 Chronic atrial fibrillation, unspecified: Secondary | ICD-10-CM

## 2017-06-11 NOTE — Telephone Encounter (Signed)
Pt last saw Dr [name] 04/25/17, last labs 07/27/16 Creat 0.76, age 76, weight 55.8kg, based on specified criteria pt is on appropriate dosage of Eliquis 5mg  BID.  Will refill rx.

## 2017-07-03 ENCOUNTER — Other Ambulatory Visit: Payer: Self-pay | Admitting: Internal Medicine

## 2017-07-05 ENCOUNTER — Other Ambulatory Visit: Payer: Self-pay | Admitting: Internal Medicine

## 2017-07-05 DIAGNOSIS — I482 Chronic atrial fibrillation, unspecified: Secondary | ICD-10-CM

## 2017-07-05 NOTE — Telephone Encounter (Signed)
Pt last saw Dr [name] 04/25/17, last labs 11/28/16 Creat 0.76, age 76, weight 55.8kg, based on specified criteria pt is on appropriate dosage of Eliquis 5mg  BID.  Will refill rx.

## 2017-08-14 DIAGNOSIS — H401133 Primary open-angle glaucoma, bilateral, severe stage: Secondary | ICD-10-CM | POA: Diagnosis not present

## 2017-08-28 DIAGNOSIS — H401133 Primary open-angle glaucoma, bilateral, severe stage: Secondary | ICD-10-CM | POA: Diagnosis not present

## 2017-09-06 DIAGNOSIS — R531 Weakness: Secondary | ICD-10-CM | POA: Diagnosis not present

## 2017-09-06 DIAGNOSIS — E559 Vitamin D deficiency, unspecified: Secondary | ICD-10-CM | POA: Diagnosis not present

## 2017-09-06 DIAGNOSIS — J019 Acute sinusitis, unspecified: Secondary | ICD-10-CM | POA: Diagnosis not present

## 2017-09-06 DIAGNOSIS — R2681 Unsteadiness on feet: Secondary | ICD-10-CM | POA: Diagnosis not present

## 2017-09-06 DIAGNOSIS — I10 Essential (primary) hypertension: Secondary | ICD-10-CM | POA: Diagnosis not present

## 2017-09-06 DIAGNOSIS — E782 Mixed hyperlipidemia: Secondary | ICD-10-CM | POA: Diagnosis not present

## 2017-09-06 DIAGNOSIS — E049 Nontoxic goiter, unspecified: Secondary | ICD-10-CM | POA: Diagnosis not present

## 2017-09-06 DIAGNOSIS — R55 Syncope and collapse: Secondary | ICD-10-CM | POA: Diagnosis not present

## 2017-09-06 DIAGNOSIS — I4891 Unspecified atrial fibrillation: Secondary | ICD-10-CM | POA: Diagnosis not present

## 2017-09-13 DIAGNOSIS — R7989 Other specified abnormal findings of blood chemistry: Secondary | ICD-10-CM | POA: Diagnosis not present

## 2017-09-25 ENCOUNTER — Encounter: Payer: Self-pay | Admitting: Internal Medicine

## 2017-09-25 ENCOUNTER — Ambulatory Visit (INDEPENDENT_AMBULATORY_CARE_PROVIDER_SITE_OTHER): Payer: Medicare Other | Admitting: Internal Medicine

## 2017-09-25 VITALS — BP 132/80 | HR 81 | Ht 62.0 in | Wt 121.0 lb

## 2017-09-25 DIAGNOSIS — I482 Chronic atrial fibrillation, unspecified: Secondary | ICD-10-CM

## 2017-09-25 DIAGNOSIS — I5032 Chronic diastolic (congestive) heart failure: Secondary | ICD-10-CM

## 2017-09-25 NOTE — H&P (View-Only) (Signed)
HPI Stephanie Simmons returns today for ongoing evaluation and management of her atrial fib and HTN. She is a pleasant 77 yo woman with a h/o as above. In the past, she has had very difficult to control atrial fib but her VR's have improved over the years. In the interim, she denies sob or chest pain. She has been compliant with her meds. She was in the store a few days ago when she suddenly felt weak. She thought she was going to pass out although she did not. Within a minute she felt better. She is referred back for additional evaluation. She denies non- compliance with her meds.  No Known Allergies   Current Outpatient Medications  Medication Sig Dispense Refill  . acetaminophen (TYLENOL) 500 MG tablet Take 500 mg by mouth every 6 (six) hours as needed (pain).    Marland Kitchen atorvastatin (LIPITOR) 10 MG tablet Take 1 tablet (10 mg total) by mouth every evening. 90 tablet 2  . bimatoprost (LUMIGAN) 0.01 % SOLN Place 1 drop into both eyes at bedtime.    . chlorhexidine (PERIDEX) 0.12 % solution Use as directed 15 mLs in the mouth or throat 2 (two) times daily.    Marland Kitchen diltiazem (CARDIZEM CD) 240 MG 24 hr capsule TAKE 1 CAPSULE (240 MG TOTAL) BY MOUTH DAILY. 30 capsule 9  . dorzolamide-timolol (COSOPT) 22.3-6.8 MG/ML ophthalmic solution 1 drop in affected eye as directed twice daily    . ELIQUIS 5 MG TABS tablet TAKE 1 TABLET BY MOUTH TWICE A DAY 60 tablet 6  . enalapril (VASOTEC) 10 MG tablet Take 10 mg by mouth. Take 1 1/2 tablets by mouth dailyt    . Esomeprazole Magnesium (NEXIUM 24HR) 20 MG TBEC Take 20 mg by mouth as directed.     . fluticasone (FLONASE ALLERGY RELIEF) 50 MCG/ACT nasal spray Place 1 spray into both nostrils daily.     . furosemide (LASIX) 40 MG tablet Take 1 tablet (40 mg total) by mouth daily. 30 tablet 7  . metoprolol succinate (TOPROL-XL) 25 MG 24 hr tablet TAKE 1 TABLET (25 MG TOTAL) BY MOUTH DAILY. TAKE WITH OR IMMEDIATELY FOLLOWING A MEAL. 30 tablet 9  . Polyethyl Glycol-Propyl  Glycol (SYSTANE OP) 1 drop See admin instructions. Once a day into affected eye(s) as needed    . potassium chloride SA (KLOR-CON M20) 20 MEQ tablet Take 2 tablets (40 mEq total) by mouth 2 (two) times daily. 360 tablet 3  . Sodium Chloride, Hypertonic, (MURO 128 OP) 1 drop See admin instructions. Into affected eye(s) every 4 hours as needed     No current facility-administered medications for this visit.      Past Medical History:  Diagnosis Date  . Chest pain    a. 04/2009 Cath: essentially nl cors, EF 65%, mod LVH.  Marland Kitchen GERD (gastroesophageal reflux disease)   . Hyperlipidemia   . Hypertension   . Hypothyroidism    does not take medication for this any longer per patient   . LVH (left ventricular hypertrophy)    a. 04/2009 Echo: EF 55-65%, mild conc LVH, no reg wma, Gr 1 DD, mild MR.  . Monocytosis 04/21/2014    ROS:   All systems reviewed and negative except as noted in the HPI.   Past Surgical History:  Procedure Laterality Date  . ABDOMINAL HYSTERECTOMY    . removal of mass- pleural       Family History  Problem Relation Age of Onset  . Other  Mother        s/p PPM, died @ 94  . Heart attack Father        died @ 34.  . Other Father        enlarged heart   . Stroke Sister      Social History   Socioeconomic History  . Marital status: Single    Spouse name: Not on file  . Number of children: Not on file  . Years of education: Not on file  . Highest education level: Not on file  Occupational History  . Not on file  Social Needs  . Financial resource strain: Not on file  . Food insecurity:    Worry: Not on file    Inability: Not on file  . Transportation needs:    Medical: Not on file    Non-medical: Not on file  Tobacco Use  . Smoking status: Former Smoker    Types: Cigarettes    Last attempt to quit: 06/26/1992    Years since quitting: 25.2  . Smokeless tobacco: Never Used  Substance and Sexual Activity  . Alcohol use: No  . Drug use: No  .  Sexual activity: Not on file  Lifestyle  . Physical activity:    Days per week: Not on file    Minutes per session: Not on file  . Stress: Not on file  Relationships  . Social connections:    Talks on phone: Not on file    Gets together: Not on file    Attends religious service: Not on file    Active member of club or organization: Not on file    Attends meetings of clubs or organizations: Not on file    Relationship status: Not on file  . Intimate partner violence:    Fear of current or ex partner: Not on file    Emotionally abused: Not on file    Physically abused: Not on file    Forced sexual activity: Not on file  Other Topics Concern  . Not on file  Social History Narrative   Lives in [place] by herself.     BP 132/80   Pulse 81   Ht 5\' 2"  (1.575 m)   Wt 121 lb (54.9 kg)   BMI 22.13 kg/m   Physical Exam:  Well appearing NAD HEENT: Unremarkable Neck:  No JVD, no thyromegally Lymphatics:  No adenopathy Back:  No CVA tenderness Lungs:  Clear with no wheezes HEART:  IRegular rate rhythm, no murmurs, no rubs, no clicks Abd:  soft, positive bowel sounds, no organomegally, no rebound, no guarding Ext:  2 plus pulses, no edema, no cyanosis, no clubbing Skin:  No rashes no nodules Neuro:  CN II through XII intact, motor grossly intact  EKG - atrial fib with a controlled VR  DEVICE  Normal device function.  See PaceArt for details.   Assess/Plan: 1. Near syncope - the etiology of her episode is unclear. I have discussed the treatment options with the patient and she will proceed with an ILR for atrial fib monitoring. 2. Atrial fib - she has a controlled VR. I am concerned about her developing CHB. She will undergo insertion of an ILR. 3. HTN - her blood pressure is controlled. Will follow.  Mikle Bosworth.D.

## 2017-09-25 NOTE — Patient Instructions (Addendum)
Medication Instructions:  Your physician recommends that you continue on your current medications as directed. Please refer to the Current Medication list given to you today.  Labwork: None ordered.  Testing/Procedures: Your physician recommends you have a loop recorder placed.  The following days are available for procedures:  April 8, 11, 15, 18, 22, 23, 24, and 30  If you decide on a day please give me a call:  Bing Neighbors [phone]  For loop recorder placement: Patient's normally arrive at Admitting down the hall from the main entrance of [facility] hospital at 6:30 am.  You will be discharged shortly after. If you need a later time due to transportation issues let me know.  Any Other Special Instructions Will Be Listed Below (If Applicable).  If you need a refill on your cardiac medications before your next appointment, please call your pharmacy.   Implantable Loop Recorder Placement An implantable loop recorder is a small electronic device that is placed under the skin of your chest. It is about the size of an AA ("double A") battery. The device records the electrical activity of your heart over a long period of time. Your health care provider can download these recordings to monitor your heart. You may need an implantable loop recorder if you have periods of abnormal heart activity (arrhythmias) or unexplained fainting (syncope) caused by a heart problem. Tell a health care provider about:  Any allergies you have.  All medicines you are taking, including vitamins, herbs, eye drops, creams, and over-the-counter medicines.  Any problems you or family members have had with anesthetic medicines.  Any blood disorders you have.  Any surgeries you have had.  Any medical conditions you have.  Whether you are pregnant or may be pregnant. What are the risks? Generally, this is a safe procedure. However, as with any procedure, problems may occur,  including:  Infection.  Bleeding.  Allergic reactions to anesthetic medicines.  Damage to nerves or blood vessels.  Failure of the device to work. This could require another surgery to replace it.  What happens before the procedure?   You may have a physical exam, blood tests, and imaging tests of your heart, such as a chest X-ray.  Follow instructions from your health care provider about eating or drinking restrictions.  Ask your health care provider about: ? Changing or stopping your regular medicines. This is especially important if you are taking diabetes medicines or blood thinners. ? Taking medicines such as aspirin and ibuprofen. These medicines can thin your blood. Do not take these medicines before your procedure if your surgeon instructs you not to.  Ask your health care provider how your surgical site will be marked or identified.  You may be given antibiotic medicine to help prevent infection.  Plan to have someone take you home after the procedure.  If you will be going home right after the procedure, plan to have someone with you for 24 hours.  Do not use any tobacco products, such as cigarettes, chewing tobacco, and e-cigarettes as told by your surgeon. If you need help quitting, ask your health care provider. What happens during the procedure?  To reduce your risk of infection: ? Your health care team will wash or sanitize their hands. ? Your skin will be washed with soap.  An IV tube will be inserted into one of your veins.  You may be given an antibiotic medicine through the IV tube.  You may be given one or more of the  following: ? A medicine to help you relax (sedative). ? A medicine to numb the area (local anesthetic).  A small cut (incision) will be made on the left side of your upper chest.  A pocket will be created under your skin.  The device will be placed in the pocket.  The incision will be closed with stitches (sutures) or adhesive  strips.  A bandage (dressing) will be placed over the incision. The procedure may vary among health care providers and hospitals. What happens after the procedure?  Your blood pressure, heart rate, breathing rate, and blood oxygen level will be monitored often until the medicines you were given have worn off.  You may be able to go home on the day of your surgery. Before going home: ? Your health care provider will program your recorder. ? You will learn how to trigger your device with a handheld activator. ? You will learn how to send recordings to your health care provider. ? You will get an ID card for your device, and you will be told when to use it.  Do not drive for 24 hours if you received a sedative. This information is not intended to replace advice given to you by your health care provider. Make sure you discuss any questions you have with your health care provider. Document Released: 05/24/2015 Document Revised: 11/18/2015 Document Reviewed: 03/17/2015 Elsevier Interactive Patient Education  [employer].

## 2017-09-25 NOTE — Progress Notes (Addendum)
HPI Stephanie Simmons returns today for ongoing evaluation and management of her atrial fib and HTN. She is a pleasant 77 yo woman with a h/o as above. In the past, she has had very difficult to control atrial fib but her VR's have improved over the years. In the interim, she denies sob or chest pain. She has been compliant with her meds. She was in the store a few days ago when she suddenly felt weak. She thought she was going to pass out although she did not. Within a minute she felt better. She is referred back for additional evaluation. She denies non- compliance with her meds.  No Known Allergies   Current Outpatient Medications  Medication Sig Dispense Refill  . acetaminophen (TYLENOL) 500 MG tablet Take 500 mg by mouth every 6 (six) hours as needed (pain).    Marland Kitchen atorvastatin (LIPITOR) 10 MG tablet Take 1 tablet (10 mg total) by mouth every evening. 90 tablet 2  . bimatoprost (LUMIGAN) 0.01 % SOLN Place 1 drop into both eyes at bedtime.    . chlorhexidine (PERIDEX) 0.12 % solution Use as directed 15 mLs in the mouth or throat 2 (two) times daily.    Marland Kitchen diltiazem (CARDIZEM CD) 240 MG 24 hr capsule TAKE 1 CAPSULE (240 MG TOTAL) BY MOUTH DAILY. 30 capsule 9  . dorzolamide-timolol (COSOPT) 22.3-6.8 MG/ML ophthalmic solution 1 drop in affected eye as directed twice daily    . ELIQUIS 5 MG TABS tablet TAKE 1 TABLET BY MOUTH TWICE A DAY 60 tablet 6  . enalapril (VASOTEC) 10 MG tablet Take 10 mg by mouth. Take 1 1/2 tablets by mouth dailyt    . Esomeprazole Magnesium (NEXIUM 24HR) 20 MG TBEC Take 20 mg by mouth as directed.     . fluticasone (FLONASE ALLERGY RELIEF) 50 MCG/ACT nasal spray Place 1 spray into both nostrils daily.     . furosemide (LASIX) 40 MG tablet Take 1 tablet (40 mg total) by mouth daily. 30 tablet 7  . metoprolol succinate (TOPROL-XL) 25 MG 24 hr tablet TAKE 1 TABLET (25 MG TOTAL) BY MOUTH DAILY. TAKE WITH OR IMMEDIATELY FOLLOWING A MEAL. 30 tablet 9  . Polyethyl Glycol-Propyl  Glycol (SYSTANE OP) 1 drop See admin instructions. Once a day into affected eye(s) as needed    . potassium chloride SA (KLOR-CON M20) 20 MEQ tablet Take 2 tablets (40 mEq total) by mouth 2 (two) times daily. 360 tablet 3  . Sodium Chloride, Hypertonic, (MURO 128 OP) 1 drop See admin instructions. Into affected eye(s) every 4 hours as needed     No current facility-administered medications for this visit.      Past Medical History:  Diagnosis Date  . Chest pain    a. 04/2009 Cath: essentially nl cors, EF 65%, mod LVH.  Marland Kitchen GERD (gastroesophageal reflux disease)   . Hyperlipidemia   . Hypertension   . Hypothyroidism    does not take medication for this any longer per patient   . LVH (left ventricular hypertrophy)    a. 04/2009 Echo: EF 55-65%, mild conc LVH, no reg wma, Gr 1 DD, mild MR.  . Monocytosis 04/21/2014    ROS:   All systems reviewed and negative except as noted in the HPI.   Past Surgical History:  Procedure Laterality Date  . ABDOMINAL HYSTERECTOMY    . removal of mass- pleural       Family History  Problem Relation Age of Onset  . Other  Mother        s/p PPM, died @ 38  . Heart attack Father        died @ 5.  . Other Father        enlarged heart   . Stroke Sister      Social History   Socioeconomic History  . Marital status: Single    Spouse name: Not on file  . Number of children: Not on file  . Years of education: Not on file  . Highest education level: Not on file  Occupational History  . Not on file  Social Needs  . Financial resource strain: Not on file  . Food insecurity:    Worry: Not on file    Inability: Not on file  . Transportation needs:    Medical: Not on file    Non-medical: Not on file  Tobacco Use  . Smoking status: Former Smoker    Types: Cigarettes    Last attempt to quit: 06/26/1992    Years since quitting: 25.2  . Smokeless tobacco: Never Used  Substance and Sexual Activity  . Alcohol use: No  . Drug use: No  .  Sexual activity: Not on file  Lifestyle  . Physical activity:    Days per week: Not on file    Minutes per session: Not on file  . Stress: Not on file  Relationships  . Social connections:    Talks on phone: Not on file    Gets together: Not on file    Attends religious service: Not on file    Active member of club or organization: Not on file    Attends meetings of clubs or organizations: Not on file    Relationship status: Not on file  . Intimate partner violence:    Fear of current or ex partner: Not on file    Emotionally abused: Not on file    Physically abused: Not on file    Forced sexual activity: Not on file  Other Topics Concern  . Not on file  Social History Narrative   Lives in [place] by herself.     BP 132/80   Pulse 81   Ht 5\' 2"  (1.575 m)   Wt 121 lb (54.9 kg)   BMI 22.13 kg/m   Physical Exam:  Well appearing NAD HEENT: Unremarkable Neck:  No JVD, no thyromegally Lymphatics:  No adenopathy Back:  No CVA tenderness Lungs:  Clear with no wheezes HEART:  IRegular rate rhythm, no murmurs, no rubs, no clicks Abd:  soft, positive bowel sounds, no organomegally, no rebound, no guarding Ext:  2 plus pulses, no edema, no cyanosis, no clubbing Skin:  No rashes no nodules Neuro:  CN II through XII intact, motor grossly intact  EKG - atrial fib with a controlled VR  DEVICE  Normal device function.  See PaceArt for details.   Assess/Plan: 1. Near syncope - the etiology of her episode is unclear. I have discussed the treatment options with the patient and she will proceed with an ILR for atrial fib monitoring. 2. Atrial fib - she has a controlled VR. I am concerned about her developing CHB. She will undergo insertion of an ILR. 3. HTN - her blood pressure is controlled. Will follow.  Mikle Bosworth.D.

## 2017-09-26 NOTE — Addendum Note (Signed)
Addended by: Marlis Edelson C on: 09/26/2017 04:25 PM   Modules accepted: Orders

## 2017-09-27 ENCOUNTER — Telehealth: Payer: Self-pay | Admitting: Internal Medicine

## 2017-09-27 NOTE — Telephone Encounter (Signed)
Returned call to Pt.  Pt scheduled for loop implant April 23 with Dr. [name].  No further needs.

## 2017-09-27 NOTE — Telephone Encounter (Signed)
Patient calling, states that she is calling back to schedule procedure

## 2017-09-29 ENCOUNTER — Other Ambulatory Visit: Payer: Self-pay | Admitting: Internal Medicine

## 2017-09-30 ENCOUNTER — Other Ambulatory Visit: Payer: Self-pay | Admitting: Internal Medicine

## 2017-10-01 MED ORDER — ENALAPRIL MALEATE 10 MG PO TABS: ORAL_TABLET | ORAL | 3 refills | Status: DC

## 2017-10-01 NOTE — Addendum Note (Signed)
Addended by: Derl Barrow on: 10/01/2017 04:56 PM   Modules accepted: Orders

## 2017-10-05 ENCOUNTER — Other Ambulatory Visit: Payer: Self-pay | Admitting: Internal Medicine

## 2017-10-09 DIAGNOSIS — R55 Syncope and collapse: Secondary | ICD-10-CM | POA: Diagnosis not present

## 2017-10-09 DIAGNOSIS — R7989 Other specified abnormal findings of blood chemistry: Secondary | ICD-10-CM | POA: Diagnosis not present

## 2017-10-09 DIAGNOSIS — R2681 Unsteadiness on feet: Secondary | ICD-10-CM | POA: Diagnosis not present

## 2017-10-16 ENCOUNTER — Ambulatory Visit (HOSPITAL_COMMUNITY)
Admission: RE | Admit: 2017-10-16 | Discharge: 2017-10-16 | Disposition: A | Discharge status: Home / Self Care | Payer: Medicare Other | Source: Ambulatory Visit | Attending: Internal Medicine | Admitting: Internal Medicine

## 2017-10-16 ENCOUNTER — Encounter (HOSPITAL_COMMUNITY): Payer: Self-pay | Admitting: Internal Medicine

## 2017-10-16 ENCOUNTER — Encounter (HOSPITAL_COMMUNITY)
Admission: RE | Disposition: A | Discharge status: Home / Self Care | Payer: Self-pay | Source: Ambulatory Visit | Attending: Internal Medicine

## 2017-10-16 DIAGNOSIS — E039 Hypothyroidism, unspecified: Secondary | ICD-10-CM | POA: Diagnosis not present

## 2017-10-16 DIAGNOSIS — Z87891 Personal history of nicotine dependence: Secondary | ICD-10-CM | POA: Insufficient documentation

## 2017-10-16 DIAGNOSIS — I10 Essential (primary) hypertension: Secondary | ICD-10-CM | POA: Insufficient documentation

## 2017-10-16 DIAGNOSIS — I4891 Unspecified atrial fibrillation: Secondary | ICD-10-CM | POA: Insufficient documentation

## 2017-10-16 DIAGNOSIS — R55 Syncope and collapse: Secondary | ICD-10-CM | POA: Diagnosis not present

## 2017-10-16 DIAGNOSIS — K219 Gastro-esophageal reflux disease without esophagitis: Secondary | ICD-10-CM | POA: Diagnosis not present

## 2017-10-16 DIAGNOSIS — E785 Hyperlipidemia, unspecified: Secondary | ICD-10-CM | POA: Diagnosis not present

## 2017-10-16 DIAGNOSIS — Z8249 Family history of ischemic heart disease and other diseases of the circulatory system: Secondary | ICD-10-CM | POA: Diagnosis not present

## 2017-10-16 DIAGNOSIS — Z7901 Long term (current) use of anticoagulants: Secondary | ICD-10-CM | POA: Insufficient documentation

## 2017-10-16 HISTORY — PX: LOOP RECORDER INSERTION: EP1214

## 2017-10-16 SURGERY — LOOP RECORDER INSERTION

## 2017-10-16 MED ORDER — LIDOCAINE-EPINEPHRINE 1 %-1:100000 IJ SOLN
INTRAMUSCULAR | Status: DC | PRN
  Administered 2017-10-16: 30 mL

## 2017-10-16 SURGICAL SUPPLY — 2 items
LOOP REVEAL LINQSYS (Prosthesis & Implant Heart) ×2 IMPLANT
PACK LOOP INSERTION (CUSTOM PROCEDURE TRAY) ×3 IMPLANT

## 2017-10-16 NOTE — Interval H&P Note (Signed)
History and Physical Interval Note:  10/16/2017 7:33 AM  Stephanie Simmons  has presented today for surgery, with the diagnosis of syncope  The various methods of treatment have been discussed with the patient and family. After consideration of risks, benefits and other options for treatment, the patient has consented to  Procedure(s): LOOP RECORDER INSERTION (N/A) as a surgical intervention .  The patient's history has been reviewed, patient examined, no change in status, stable for surgery.  I have reviewed the patient's chart and labs.  Questions were answered to the patient's satisfaction.     Cristopher Peru

## 2017-10-16 NOTE — Discharge Instructions (Signed)
°  IMPLANTABLE LOOP RECORDER INSTRUCTION SHEET GIVEN

## 2017-10-17 ENCOUNTER — Ambulatory Visit (INDEPENDENT_AMBULATORY_CARE_PROVIDER_SITE_OTHER): Payer: Self-pay | Admitting: Internal Medicine

## 2017-10-17 ENCOUNTER — Telehealth: Payer: Self-pay | Admitting: Cardiology

## 2017-10-17 DIAGNOSIS — Z95818 Presence of other cardiac implants and grafts: Secondary | ICD-10-CM

## 2017-10-17 DIAGNOSIS — I482 Chronic atrial fibrillation, unspecified: Secondary | ICD-10-CM

## 2017-10-17 NOTE — Progress Notes (Signed)
Patient presented with a on approximated wound from a Linq implanted yesterday.  It was bleeding which likely was the cause of the wound separation; the Steri-Strips have been washed off.  Because of exposure of the device through the skin we removed the device following local anesthetic.  Silver nitrate was used along the incision and the wound was reapproximated.

## 2017-10-17 NOTE — Telephone Encounter (Signed)
Spoke with Ms. Brandenburger and her sister. They report blood on undershirt that the patient slept in from [facility] site down to patient's underwear. They have held pressure for 10 minutes, the tissue is bloody, they don't know if it is still bleeding. The patient is on Eliquis and has taken medications as prescribed. They are agreeable to come to the [facility] today at noon.

## 2017-10-17 NOTE — Progress Notes (Signed)
Stephanie Simmons presents to the [facility] today for bleeding from Mark site (implanted yesterday by Dr. [name]). Steri-strips removed while removing tissues that the patient had placed over the site. Incision opened and actively bleeding. Manual pressure held x 5 mins, bleeding still active. Manual pressure applied again x 5 mins, bleeding continued. New steri-strips applied and manual pressure held once more for 5 minutes, bleeding remains active. Dr. Caryl Comes (DOD) consulted- he elects for LINQ removal in the office due to infection risk.  Dr. Caryl Comes explained the procedure to Stephanie Simmons and her sister, Stephanie Simmons elects to continue. LINQ removal performed in office by Dr. Caryl Comes. Stephanie Simmons to follow up as scheduled 10/29/17 at 11am. She was advised to hold Eliquis today and resume Eliquis tomorrow 10/18/17 per Dr. Caryl Comes. I advised her to minimize her activity today and to avoid showering today. She and her sister verbalize understanding.

## 2017-10-17 NOTE — Telephone Encounter (Signed)
Patient called and stated that she has some bleeding from her loop recorder incision site. Stated that they have applied pressure for approximately 10 minutes. Call routed to [place].

## 2017-10-29 ENCOUNTER — Ambulatory Visit (INDEPENDENT_AMBULATORY_CARE_PROVIDER_SITE_OTHER): Payer: Self-pay | Admitting: *Deleted

## 2017-10-29 ENCOUNTER — Ambulatory Visit: Payer: Medicare Other

## 2017-10-29 DIAGNOSIS — R55 Syncope and collapse: Secondary | ICD-10-CM

## 2017-10-29 NOTE — Progress Notes (Signed)
Patient presents to the office for wound check s/p ILR explant on 10/17/17. Steri strips removed, incision edges were not fully approximated. Incision edges reapproximated, new steri strips applied. Patient to f/u with device clinic for a wound recheck on 5/9 @ 1430.

## 2017-10-31 ENCOUNTER — Telehealth: Payer: Self-pay | Admitting: Cardiology

## 2017-10-31 ENCOUNTER — Telehealth: Payer: Self-pay | Admitting: *Deleted

## 2017-10-31 NOTE — Telephone Encounter (Signed)
Called patient to see if she is able to move up [facility] appointment tomorrow to 1:30pm.  She took down our direct number and will have to call her ride to find out if they can take her for a 1:30pm appointment.  Patient will plan to call back to let us know.  She denies additional questions or concerns at this time.

## 2017-10-31 NOTE — Telephone Encounter (Signed)
Spoke w/ pt and requested that she send a manual transmission b/c her home monitor has not updated in at least 14 days. Pt does not have a loop recorder any more b/c it was removed a few days after having loop recorder implanted.

## 2017-10-31 NOTE — Telephone Encounter (Signed)
Patient agreeable to wound check appointment at 1:30pm on 11/01/17.

## 2017-11-01 ENCOUNTER — Ambulatory Visit: Payer: Medicare Other

## 2017-11-01 ENCOUNTER — Ambulatory Visit (INDEPENDENT_AMBULATORY_CARE_PROVIDER_SITE_OTHER): Payer: Self-pay | Admitting: *Deleted

## 2017-11-01 DIAGNOSIS — I482 Chronic atrial fibrillation, unspecified: Secondary | ICD-10-CM

## 2017-11-01 NOTE — Patient Instructions (Signed)
Call our office at [phone] for an update if you have not heard from [place] by 11/16/17 regarding follow-up with Dr. [name].  Call the [facility] at [phone] if you note any persistent drainage from your wound, or if you have fever or chills.  Wash the site with antibacterial soap and water daily when you bathe.  Pat dry.  You can apply a bandaid for comfort.

## 2017-11-01 NOTE — Progress Notes (Signed)
Wound recheck s/p ILR explant on 10/17/17.  Steri-strips removed.  Incision edges superficially unapproximated.  No redness, swelling, fever, or chills. Scant serous drainage noted, Dr. Caryl Comes unable to express any additional drainage.  Site cleansed with saline.  Site left OTA per Dr. Caryl Comes, bandaids given to patient in the event of drainage.  Patient instructed to wash site daily with soap and water when bathing and pat dry.  Patient to monitor for signs of healing, and is aware to call our office if she notes increased drainage or fever/chills.  F/u with Dr. [name] TBD.

## 2017-11-13 ENCOUNTER — Other Ambulatory Visit: Payer: Self-pay | Admitting: Internal Medicine

## 2017-11-15 ENCOUNTER — Telehealth: Payer: Self-pay | Admitting: *Deleted

## 2017-11-15 NOTE — Telephone Encounter (Signed)
Followed up with patient post LINQ explant in office last month. Pt reports site is healing well. Pt denies any further "episodes" since implant/explant. Advised patient to call the office if she experiences pre-syncope/syncope. Pt understands office will send her a reminder letter as it gets closer to her follow up w/ Dr. [name] later this year. She is agreeable to plan.

## 2017-11-22 ENCOUNTER — Telehealth: Payer: Self-pay | Admitting: Internal Medicine

## 2017-11-22 NOTE — Telephone Encounter (Signed)
New Message    Pt's sister is calling states that the pt's loop did not take and she had it removed and they want to know when she needs to follow up. Please call

## 2017-11-22 NOTE — Telephone Encounter (Signed)
Spoke with Pt sister.  Per sister Pt is refusing to leave her bed, spending 5-6 hours daily in bed.   Sister is worried about Pt not leaving bed.  Pt does not feel well, does not sound syncopal. Sister requesting appt with Dr. [name] to discuss s/p loop being removed. Spoke with Pt.  Advised of appt.  Pt indicates understanding.

## 2017-12-17 ENCOUNTER — Ambulatory Visit (INDEPENDENT_AMBULATORY_CARE_PROVIDER_SITE_OTHER): Payer: Medicare Other | Admitting: Internal Medicine

## 2017-12-17 ENCOUNTER — Encounter: Payer: Self-pay | Admitting: Internal Medicine

## 2017-12-17 VITALS — BP 128/74 | HR 80 | Ht 62.0 in | Wt 118.2 lb

## 2017-12-17 DIAGNOSIS — I5032 Chronic diastolic (congestive) heart failure: Secondary | ICD-10-CM

## 2017-12-17 DIAGNOSIS — R5383 Other fatigue: Secondary | ICD-10-CM | POA: Diagnosis not present

## 2017-12-17 DIAGNOSIS — I482 Chronic atrial fibrillation, unspecified: Secondary | ICD-10-CM

## 2017-12-17 DIAGNOSIS — R55 Syncope and collapse: Secondary | ICD-10-CM | POA: Diagnosis not present

## 2017-12-17 NOTE — Patient Instructions (Addendum)
Medication Instructions:  Your physician recommends that you continue on your current medications as directed. Please refer to the Current Medication list given to you today.  Labwork: None ordered.  Testing/Procedures: Your physician has requested that you have an echocardiogram. Echocardiography is a painless test that uses sound waves to create images of your heart. It provides your doctor with information about the size and shape of your heart and how well your heart's chambers and valves are working. This procedure takes approximately one hour. There are no restrictions for this procedure.  Please schedule for an ECHO.  Follow-Up: Your physician wants you to follow-up in: based on results of ECHO.   Any Other Special Instructions Will Be Listed Below (If Applicable).  If you need a refill on your cardiac medications before your next appointment, please call your pharmacy.

## 2017-12-17 NOTE — Progress Notes (Signed)
HPI Stephanie Simmons returns today for followup of her atrial fib. She is a pleasant 77 yo woman with chronic atrial fib, HTN, and syncope. She has undergone insertion of an ILR but developed bleeding at her site and her device ultimately had to come out. Her sister is with her today and notes that the patient has been staying bed. When asked why she does not give a good answer. She denies chest pain, syncope or edema. She has mild dyspnea with exertion.  No Known Allergies   Current Outpatient Medications  Medication Sig Dispense Refill  . acetaminophen (TYLENOL) 500 MG tablet Take 500 mg by mouth every 6 (six) hours as needed (pain).    Marland Kitchen atorvastatin (LIPITOR) 10 MG tablet Take 1 tablet (10 mg total) by mouth every evening. 90 tablet 2  . bimatoprost (LUMIGAN) 0.01 % SOLN Place 1 drop into both eyes at bedtime.    . chlorhexidine (PERIDEX) 0.12 % solution Use as directed 15 mLs in the mouth or throat 2 (two) times daily.    . CHOLECALCIFEROL PO Take 1 tablet by mouth daily.    Marland Kitchen diltiazem (CARDIZEM CD) 240 MG 24 hr capsule TAKE 1 CAPSULE (240 MG TOTAL) BY MOUTH DAILY. 90 capsule 3  . dorzolamide-timolol (COSOPT) 22.3-6.8 MG/ML ophthalmic solution Place 1 drop into both eyes 2 (two) times daily. 1 drop in affected eye as directed twice daily    . ELIQUIS 5 MG TABS tablet TAKE 1 TABLET BY MOUTH TWICE A DAY 60 tablet 6  . enalapril (VASOTEC) 10 MG tablet Take 1 1/2 tablets by mouth daily (Patient taking differently: Take 10 mg by mouth daily. Take 1 1/2 tablets by mouth daily) 135 tablet 3  . Esomeprazole Magnesium (NEXIUM 24HR) 20 MG TBEC Take 20 mg by mouth as directed.     . furosemide (LASIX) 40 MG tablet TAKE 1 TABLET BY MOUTH EVERY DAY 90 tablet 3  . KLOR-CON M20 20 MEQ tablet TAKE 2 TABLETS (40 MEQ TOTAL) BY MOUTH 2 (TWO) TIMES DAILY. 360 tablet 3  . metoprolol succinate (TOPROL-XL) 25 MG 24 hr tablet TAKE 1 TABLET (25 MG TOTAL) BY MOUTH DAILY. TAKE WITH OR IMMEDIATELY FOLLOWING A MEAL.  90 tablet 3  . Polyethyl Glycol-Propyl Glycol (SYSTANE OP) 1 drop See admin instructions. Once a day into affected eye(s) as needed    . Sodium Chloride, Hypertonic, (MURO 128 OP) 1 drop See admin instructions. Into affected eye(s) every 4 hours as needed     No current facility-administered medications for this visit.      Past Medical History:  Diagnosis Date  . Chest pain    a. 04/2009 Cath: essentially nl cors, EF 65%, mod LVH.  Marland Kitchen GERD (gastroesophageal reflux disease)   . Hyperlipidemia   . Hypertension   . Hypothyroidism    does not take medication for this any longer per patient   . LVH (left ventricular hypertrophy)    a. 04/2009 Echo: EF 55-65%, mild conc LVH, no reg wma, Gr 1 DD, mild MR.  . Monocytosis 04/21/2014    ROS:   All systems reviewed and negative except as noted in the HPI.   Past Surgical History:  Procedure Laterality Date  . ABDOMINAL HYSTERECTOMY    . LOOP RECORDER INSERTION N/A 10/16/2017   Procedure: LOOP RECORDER INSERTION;  Surgeon: Evans Lance, MD;  Location: [facility];  Service: Cardiovascular;  Laterality: N/A;  . removal of mass- pleural  Family History  Problem Relation Age of Onset  . Other Mother        s/p PPM, died @ 81  . Heart attack Father        died @ 71.  . Other Father        enlarged heart   . Stroke Sister      Social History   Socioeconomic History  . Marital status: Single    Spouse name: Not on file  . Number of children: Not on file  . Years of education: Not on file  . Highest education level: Not on file  Occupational History  . Not on file  Social Needs  . Financial resource strain: Not on file  . Food insecurity:    Worry: Not on file    Inability: Not on file  . Transportation needs:    Medical: Not on file    Non-medical: Not on file  Tobacco Use  . Smoking status: Former Smoker    Types: Cigarettes    Last attempt to quit: 06/26/1992    Years since quitting: 25.4  .  Smokeless tobacco: Never Used  Substance and Sexual Activity  . Alcohol use: No  . Drug use: No  . Sexual activity: Not on file  Lifestyle  . Physical activity:    Days per week: Not on file    Minutes per session: Not on file  . Stress: Not on file  Relationships  . Social connections:    Talks on phone: Not on file    Gets together: Not on file    Attends religious service: Not on file    Active member of club or organization: Not on file    Attends meetings of clubs or organizations: Not on file    Relationship status: Not on file  . Intimate partner violence:    Fear of current or ex partner: Not on file    Emotionally abused: Not on file    Physically abused: Not on file    Forced sexual activity: Not on file  Other Topics Concern  . Not on file  Social History Narrative   Lives in [place] by herself.     BP 128/74   Pulse 80   Ht 5\' 2"  (1.575 m)   Wt 118 lb 3.2 oz (53.6 kg)   BMI 21.62 kg/m   Physical Exam:  Chronically ill appearing 77 yo woman, NAD HEENT: Unremarkable Neck:  7 cm JVD, no thyromegally Lymphatics:  No adenopathy Back:  No CVA tenderness Lungs:  Clear with no wheezes HEART:  IRegular rate rhythm, no murmurs, no rubs, no clicks Abd:  soft, positive bowel sounds, no organomegally, no rebound, no guarding Ext:  2 plus pulses, no edema, no cyanosis, no clubbing Skin:  No rashes no nodules Neuro:  CN II through XII intact, motor grossly intact  EKG - atrial fib with a rightward axis  DEVICE  Normal device function.  See PaceArt for details.   Assess/Plan: 1. Atrial fib - her rates are reasonably well controlled. We will continue her current meds. 2. HTN - her blood pressure is controlled. We will follow.  3. Dyspnea - the etiology is unclear. It has been several years since she has a 2D echo. We will have her undergo an echo for eval.   Mikle Bosworth.D.

## 2017-12-19 ENCOUNTER — Telehealth: Payer: Self-pay | Admitting: Internal Medicine

## 2017-12-19 NOTE — Telephone Encounter (Signed)
New Message:       Pt's sister is calling and request to speak with a Therapist, sports.

## 2017-12-20 ENCOUNTER — Other Ambulatory Visit: Payer: Self-pay

## 2017-12-20 ENCOUNTER — Ambulatory Visit (HOSPITAL_COMMUNITY): Payer: Medicare Other | Attending: Internal Medicine

## 2017-12-20 DIAGNOSIS — R5383 Other fatigue: Secondary | ICD-10-CM

## 2017-12-20 DIAGNOSIS — Z87891 Personal history of nicotine dependence: Secondary | ICD-10-CM | POA: Diagnosis not present

## 2017-12-20 DIAGNOSIS — I5032 Chronic diastolic (congestive) heart failure: Secondary | ICD-10-CM | POA: Diagnosis not present

## 2017-12-20 DIAGNOSIS — Z8249 Family history of ischemic heart disease and other diseases of the circulatory system: Secondary | ICD-10-CM | POA: Insufficient documentation

## 2017-12-20 DIAGNOSIS — I272 Pulmonary hypertension, unspecified: Secondary | ICD-10-CM | POA: Insufficient documentation

## 2017-12-20 DIAGNOSIS — I4891 Unspecified atrial fibrillation: Secondary | ICD-10-CM | POA: Diagnosis not present

## 2017-12-20 DIAGNOSIS — I110 Hypertensive heart disease with heart failure: Secondary | ICD-10-CM | POA: Diagnosis not present

## 2017-12-20 DIAGNOSIS — R079 Chest pain, unspecified: Secondary | ICD-10-CM | POA: Diagnosis not present

## 2017-12-20 DIAGNOSIS — I313 Pericardial effusion (noninflammatory): Secondary | ICD-10-CM | POA: Insufficient documentation

## 2017-12-20 DIAGNOSIS — I083 Combined rheumatic disorders of mitral, aortic and tricuspid valves: Secondary | ICD-10-CM | POA: Diagnosis not present

## 2017-12-20 DIAGNOSIS — E785 Hyperlipidemia, unspecified: Secondary | ICD-10-CM | POA: Insufficient documentation

## 2017-12-20 NOTE — Telephone Encounter (Signed)
Returned call to Pt's sister.  Sister asking if Dr. [name] would write Pt a prescription for an antidepressant.  Pt has not seen her PCP in a long time.  Will ask.

## 2017-12-21 NOTE — Telephone Encounter (Signed)
Returned call to Pt's sister.  Advised Dr. [name] declined to order an antidepressant for her sister.  Per sister she made an appt with a PCP for Pt. No further action needed at this time.

## 2017-12-24 ENCOUNTER — Telehealth: Payer: Self-pay

## 2017-12-24 DIAGNOSIS — I272 Pulmonary hypertension, unspecified: Secondary | ICD-10-CM

## 2017-12-24 NOTE — Telephone Encounter (Signed)
-----   Message from Evans Lance, MD sent at 12/22/2017 11:48 PM EDT ----- She has developed severe pulmonary HTN. Please refer her to Dr. Aundra Dubin. GT

## 2017-12-24 NOTE — Telephone Encounter (Signed)
Call placed to Pt. Advised of ECHO results. Notified would be referring her to Dr. Aundra Dubin with heart failure clinic. Pt indicates understanding.  Notified sister also.

## 2017-12-25 ENCOUNTER — Telehealth (HOSPITAL_COMMUNITY): Payer: Self-pay | Admitting: Vascular Surgery

## 2017-12-25 ENCOUNTER — Telehealth: Payer: Self-pay | Admitting: Internal Medicine

## 2017-12-25 ENCOUNTER — Other Ambulatory Visit: Payer: Self-pay | Admitting: Internal Medicine

## 2017-12-25 DIAGNOSIS — I482 Chronic atrial fibrillation, unspecified: Secondary | ICD-10-CM

## 2017-12-25 NOTE — Telephone Encounter (Signed)
Eliquis 5mg  refill request received; pt is 77 yrs old, wt-53.6kg, Crea-0.73 on 09/06/17 via [place], last seen by Dr. [name] on 12/17/17; will send in refill to requested pharmacy.

## 2017-12-25 NOTE — Telephone Encounter (Signed)
Returned call to Pt's sister.  Pt last OV visit and ECHO results sent to Pt's PCP per request.  NO further action needed.

## 2017-12-25 NOTE — Telephone Encounter (Signed)
New message:       Pt's daughter is calling and states she would like to know if you called the pt's pcp Dr. Benjamine Mola with the pt's results you called them with on yesterday.

## 2017-12-25 NOTE — Telephone Encounter (Signed)
called pt to schedule new PULM HTN, she states its too early in the morning to be scheduling appt she wants me to call back later

## 2017-12-26 DIAGNOSIS — I509 Heart failure, unspecified: Secondary | ICD-10-CM | POA: Diagnosis not present

## 2017-12-26 DIAGNOSIS — F331 Major depressive disorder, recurrent, moderate: Secondary | ICD-10-CM | POA: Diagnosis not present

## 2017-12-26 DIAGNOSIS — I272 Pulmonary hypertension, unspecified: Secondary | ICD-10-CM | POA: Diagnosis not present

## 2017-12-30 ENCOUNTER — Telehealth: Payer: Self-pay | Admitting: Physician Assistant

## 2017-12-30 NOTE — Telephone Encounter (Signed)
Stephanie Simmons is a 77 y.o. female with atrial fibrillation, hypertension, diastolic congestive heart failure, pulmonary hypertension.  A recent echo showed a PASP > 90.  She is pending evaluation with Dr. Loralie Champagne in the CHF clinic in 2 weeks.    Thomasene Lot, the patient's daughter called the answering service.  The patient has not been eating well the last 2 days and has been vomiting.  She feels like she may be a little more short of breath.  She denies chest pain, syncope, leg swelling.  She is quite fatigued.  She feels like she may have a fever.  She was concerned her symptoms may be related to congestive heart failure.  PLAN:  1. I explained that her failure to thrive symptoms are not likely related to diastolic CHF and pulmonary HTN. 2. I have recommended she go to the ED for evaluation as her symptoms sound suspicious for an underlying infection. Richardson Dopp, PA-C    12/30/2017 4:08 PM

## 2017-12-31 ENCOUNTER — Telehealth: Payer: Self-pay | Admitting: Internal Medicine

## 2017-12-31 NOTE — Telephone Encounter (Signed)
New Message:     Stephanie Simmons would like for you or [name] to call the patient please. She say the pt is not doing well at all.

## 2018-01-01 ENCOUNTER — Inpatient Hospital Stay (HOSPITAL_COMMUNITY): Payer: Medicare Other

## 2018-01-01 ENCOUNTER — Emergency Department (HOSPITAL_COMMUNITY): Payer: Medicare Other

## 2018-01-01 ENCOUNTER — Other Ambulatory Visit: Payer: Self-pay

## 2018-01-01 ENCOUNTER — Encounter (HOSPITAL_COMMUNITY): Payer: Self-pay

## 2018-01-01 ENCOUNTER — Inpatient Hospital Stay (HOSPITAL_COMMUNITY)
Admission: EM | Admit: 2018-01-01 | Discharge: 2018-01-04 | DRG: 064 | Disposition: A | Discharge status: Skilled Nursing Facility | Payer: Medicare Other | Attending: Internal Medicine | Admitting: Internal Medicine

## 2018-01-01 DIAGNOSIS — I4891 Unspecified atrial fibrillation: Secondary | ICD-10-CM | POA: Diagnosis present

## 2018-01-01 DIAGNOSIS — R531 Weakness: Secondary | ICD-10-CM | POA: Diagnosis not present

## 2018-01-01 DIAGNOSIS — R627 Adult failure to thrive: Secondary | ICD-10-CM | POA: Diagnosis present

## 2018-01-01 DIAGNOSIS — R32 Unspecified urinary incontinence: Secondary | ICD-10-CM | POA: Diagnosis not present

## 2018-01-01 DIAGNOSIS — I6340 Cerebral infarction due to embolism of unspecified cerebral artery: Secondary | ICD-10-CM | POA: Diagnosis not present

## 2018-01-01 DIAGNOSIS — Z7401 Bed confinement status: Secondary | ICD-10-CM | POA: Diagnosis not present

## 2018-01-01 DIAGNOSIS — M2550 Pain in unspecified joint: Secondary | ICD-10-CM | POA: Diagnosis not present

## 2018-01-01 DIAGNOSIS — Z6820 Body mass index (BMI) 20.0-20.9, adult: Secondary | ICD-10-CM | POA: Diagnosis not present

## 2018-01-01 DIAGNOSIS — I481 Persistent atrial fibrillation: Secondary | ICD-10-CM | POA: Diagnosis present

## 2018-01-01 DIAGNOSIS — R001 Bradycardia, unspecified: Secondary | ICD-10-CM | POA: Diagnosis not present

## 2018-01-01 DIAGNOSIS — I272 Pulmonary hypertension, unspecified: Secondary | ICD-10-CM | POA: Diagnosis present

## 2018-01-01 DIAGNOSIS — R29810 Facial weakness: Secondary | ICD-10-CM | POA: Diagnosis not present

## 2018-01-01 DIAGNOSIS — I639 Cerebral infarction, unspecified: Secondary | ICD-10-CM | POA: Diagnosis not present

## 2018-01-01 DIAGNOSIS — E039 Hypothyroidism, unspecified: Secondary | ICD-10-CM | POA: Diagnosis present

## 2018-01-01 DIAGNOSIS — G464 Cerebellar stroke syndrome: Secondary | ICD-10-CM | POA: Diagnosis not present

## 2018-01-01 DIAGNOSIS — K219 Gastro-esophageal reflux disease without esophagitis: Secondary | ICD-10-CM | POA: Diagnosis present

## 2018-01-01 DIAGNOSIS — R778 Other specified abnormalities of plasma proteins: Secondary | ICD-10-CM | POA: Diagnosis present

## 2018-01-01 DIAGNOSIS — Z7901 Long term (current) use of anticoagulants: Secondary | ICD-10-CM

## 2018-01-01 DIAGNOSIS — R7989 Other specified abnormal findings of blood chemistry: Secondary | ICD-10-CM | POA: Diagnosis present

## 2018-01-01 DIAGNOSIS — R079 Chest pain, unspecified: Secondary | ICD-10-CM | POA: Diagnosis not present

## 2018-01-01 DIAGNOSIS — I110 Hypertensive heart disease with heart failure: Secondary | ICD-10-CM | POA: Diagnosis present

## 2018-01-01 DIAGNOSIS — R402142 Coma scale, eyes open, spontaneous, at arrival to emergency department: Secondary | ICD-10-CM | POA: Diagnosis present

## 2018-01-01 DIAGNOSIS — R29702 NIHSS score 2: Secondary | ICD-10-CM | POA: Diagnosis present

## 2018-01-01 DIAGNOSIS — R278 Other lack of coordination: Secondary | ICD-10-CM | POA: Diagnosis not present

## 2018-01-01 DIAGNOSIS — Z79899 Other long term (current) drug therapy: Secondary | ICD-10-CM | POA: Diagnosis not present

## 2018-01-01 DIAGNOSIS — M6281 Muscle weakness (generalized): Secondary | ICD-10-CM | POA: Diagnosis not present

## 2018-01-01 DIAGNOSIS — R0902 Hypoxemia: Secondary | ICD-10-CM | POA: Diagnosis not present

## 2018-01-01 DIAGNOSIS — R402362 Coma scale, best motor response, obeys commands, at arrival to emergency department: Secondary | ICD-10-CM | POA: Diagnosis present

## 2018-01-01 DIAGNOSIS — I509 Heart failure, unspecified: Secondary | ICD-10-CM | POA: Diagnosis not present

## 2018-01-01 DIAGNOSIS — I480 Paroxysmal atrial fibrillation: Secondary | ICD-10-CM | POA: Diagnosis not present

## 2018-01-01 DIAGNOSIS — R42 Dizziness and giddiness: Secondary | ICD-10-CM | POA: Diagnosis not present

## 2018-01-01 DIAGNOSIS — I6300 Cerebral infarction due to thrombosis of unspecified precerebral artery: Secondary | ICD-10-CM | POA: Diagnosis not present

## 2018-01-01 DIAGNOSIS — E7800 Pure hypercholesterolemia, unspecified: Secondary | ICD-10-CM | POA: Diagnosis not present

## 2018-01-01 DIAGNOSIS — I6521 Occlusion and stenosis of right carotid artery: Secondary | ICD-10-CM | POA: Diagnosis not present

## 2018-01-01 DIAGNOSIS — I482 Chronic atrial fibrillation: Secondary | ICD-10-CM | POA: Diagnosis not present

## 2018-01-01 DIAGNOSIS — R748 Abnormal levels of other serum enzymes: Secondary | ICD-10-CM | POA: Diagnosis not present

## 2018-01-01 DIAGNOSIS — R402252 Coma scale, best verbal response, oriented, at arrival to emergency department: Secondary | ICD-10-CM | POA: Diagnosis present

## 2018-01-01 DIAGNOSIS — G459 Transient cerebral ischemic attack, unspecified: Secondary | ICD-10-CM | POA: Diagnosis not present

## 2018-01-01 DIAGNOSIS — I499 Cardiac arrhythmia, unspecified: Secondary | ICD-10-CM | POA: Diagnosis not present

## 2018-01-01 DIAGNOSIS — E785 Hyperlipidemia, unspecified: Secondary | ICD-10-CM

## 2018-01-01 DIAGNOSIS — Z87891 Personal history of nicotine dependence: Secondary | ICD-10-CM | POA: Diagnosis not present

## 2018-01-01 DIAGNOSIS — I5033 Acute on chronic diastolic (congestive) heart failure: Secondary | ICD-10-CM | POA: Diagnosis present

## 2018-01-01 HISTORY — DX: Cerebral infarction, unspecified: I63.9

## 2018-01-01 HISTORY — DX: Heart failure, unspecified: I50.9

## 2018-01-01 LAB — RAPID URINE DRUG SCREEN, HOSP PERFORMED
AMPHETAMINES: NOT DETECTED
BENZODIAZEPINES: NOT DETECTED
Cocaine: NOT DETECTED
Opiates: NOT DETECTED
TETRAHYDROCANNABINOL: NOT DETECTED

## 2018-01-01 LAB — BASIC METABOLIC PANEL
ANION GAP: 11 (ref 5–15)
BUN: 16 mg/dL (ref 8–23)
CALCIUM: 9.2 mg/dL (ref 8.9–10.3)
CHLORIDE: 104 mmol/L (ref 98–111)
CO2: 21 mmol/L — ABNORMAL LOW (ref 22–32)
Creatinine, Ser: 0.86 mg/dL (ref 0.44–1.00)
GFR calc non Af Amer: >60 mL/min (ref >60)
Glucose, Bld: 85 mg/dL (ref 70–99)
Potassium: 4.9 mmol/L (ref 3.5–5.1)
Sodium: 136 mmol/L (ref 135–145)

## 2018-01-01 LAB — HEPATIC FUNCTION PANEL
ALT: 27 U/L (ref 0–44)
AST: 29 U/L (ref 15–41)
Albumin: 3.3 g/dL — ABNORMAL LOW (ref 3.5–5.0)
Alkaline Phosphatase: 43 U/L (ref 38–126)
BILIRUBIN DIRECT: 0.4 mg/dL — AB (ref 0.0–0.2)
Indirect Bilirubin: 1 mg/dL — ABNORMAL HIGH (ref 0.3–0.9)
Total Bilirubin: 1.4 mg/dL — ABNORMAL HIGH (ref 0.3–1.2)
Total Protein: 5.6 g/dL — ABNORMAL LOW (ref 6.5–8.1)

## 2018-01-01 LAB — CBC
HCT: 45.2 % (ref 36.0–46.0)
HEMOGLOBIN: 13.8 g/dL (ref 12.0–15.0)
MCH: 24.9 pg — AB (ref 26.0–34.0)
MCHC: 30.5 g/dL (ref 30.0–36.0)
MCV: 81.6 fL (ref 78.0–100.0)
Platelets: 245 10*3/uL (ref 150–400)
RBC: 5.54 MIL/uL — AB (ref 3.87–5.11)
RDW: 18.2 % — ABNORMAL HIGH (ref 11.5–15.5)
WBC: 7.5 10*3/uL (ref 4.0–10.5)

## 2018-01-01 LAB — PROTIME-INR
INR: 1.55
Prothrombin Time: 18.4 seconds — ABNORMAL HIGH (ref 11.4–15.2)

## 2018-01-01 LAB — BRAIN NATRIURETIC PEPTIDE: B NATRIURETIC PEPTIDE 5: 1139.4 pg/mL — AB (ref 0.0–100.0)

## 2018-01-01 LAB — TROPONIN I
Troponin I: 0.13 ng/mL (ref <0.03)
Troponin I: 0.11 ng/mL (ref <0.03)

## 2018-01-01 LAB — TSH: TSH: 1.98 u[IU]/mL (ref 0.350–4.500)

## 2018-01-01 LAB — APTT: aPTT: 41 seconds — ABNORMAL HIGH (ref 24–36)

## 2018-01-01 MED ORDER — CHLORHEXIDINE GLUCONATE 0.12 % MT SOLN
15.0000 mL | Freq: Two times a day (BID) | OROMUCOSAL | Status: DC
  Administered 2018-01-01 – 2018-01-04 (×6): 15 mL via OROMUCOSAL
  Filled 2018-01-01 (×6): qty 15

## 2018-01-01 MED ORDER — SENNOSIDES-DOCUSATE SODIUM 8.6-50 MG PO TABS: 1.0000 | ORAL_TABLET | Freq: Every evening | ORAL | Status: DC | PRN

## 2018-01-01 MED ORDER — POTASSIUM CHLORIDE CRYS ER 20 MEQ PO TBCR
40.0000 meq | EXTENDED_RELEASE_TABLET | Freq: Two times a day (BID) | ORAL | Status: DC
  Administered 2018-01-02 – 2018-01-04 (×5): 40 meq via ORAL
  Filled 2018-01-01 (×5): qty 2

## 2018-01-01 MED ORDER — APIXABAN 5 MG PO TABS
5.0000 mg | ORAL_TABLET | Freq: Two times a day (BID) | ORAL | Status: DC
  Administered 2018-01-01 – 2018-01-04 (×6): 5 mg via ORAL
  Filled 2018-01-01 (×6): qty 1

## 2018-01-01 MED ORDER — STROKE: EARLY STAGES OF RECOVERY BOOK
Freq: Once | Status: AC
  Administered 2018-01-01: 23:00:00
  Filled 2018-01-01: qty 1

## 2018-01-01 MED ORDER — DILTIAZEM HCL ER COATED BEADS 240 MG PO CP24
240.0000 mg | ORAL_CAPSULE | Freq: Every day | ORAL | Status: DC
  Administered 2018-01-02: 240 mg via ORAL
  Filled 2018-01-01: qty 1

## 2018-01-01 MED ORDER — ACETAMINOPHEN 325 MG PO TABS: 650.0000 mg | ORAL_TABLET | ORAL | Status: DC | PRN

## 2018-01-01 MED ORDER — IOPAMIDOL (ISOVUE-370) INJECTION 76%
INTRAVENOUS | Status: AC
  Filled 2018-01-01: qty 50

## 2018-01-01 MED ORDER — FLUOXETINE HCL 20 MG PO CAPS
20.0000 mg | ORAL_CAPSULE | Freq: Every day | ORAL | Status: DC
  Administered 2018-01-02 – 2018-01-04 (×3): 20 mg via ORAL
  Filled 2018-01-01 (×2): qty 1

## 2018-01-01 MED ORDER — IOPAMIDOL (ISOVUE-370) INJECTION 76%
50.0000 mL | Freq: Once | INTRAVENOUS | Status: AC | PRN
  Administered 2018-01-01: 50 mL via INTRAVENOUS

## 2018-01-01 MED ORDER — FUROSEMIDE 10 MG/ML IJ SOLN
40.0000 mg | Freq: Two times a day (BID) | INTRAMUSCULAR | Status: DC
  Administered 2018-01-01: 40 mg via INTRAVENOUS
  Filled 2018-01-01: qty 4

## 2018-01-01 MED ORDER — POLYVINYL ALCOHOL 1.4 % OP SOLN
1.0000 [drp] | OPHTHALMIC | Status: DC | PRN
  Administered 2018-01-03: 1 [drp] via OPHTHALMIC
  Filled 2018-01-01: qty 15

## 2018-01-01 MED ORDER — LATANOPROST 0.005 % OP SOLN
1.0000 [drp] | Freq: Every day | OPHTHALMIC | Status: DC
  Administered 2018-01-01 – 2018-01-03 (×3): 1 [drp] via OPHTHALMIC
  Filled 2018-01-01: qty 2.5

## 2018-01-01 MED ORDER — DORZOLAMIDE HCL-TIMOLOL MAL 2-0.5 % OP SOLN
1.0000 [drp] | Freq: Two times a day (BID) | OPHTHALMIC | Status: DC
  Administered 2018-01-01 – 2018-01-04 (×6): 1 [drp] via OPHTHALMIC
  Filled 2018-01-01: qty 10

## 2018-01-01 MED ORDER — METOPROLOL SUCCINATE ER 25 MG PO TB24
25.0000 mg | ORAL_TABLET | Freq: Every day | ORAL | Status: DC
  Administered 2018-01-02 – 2018-01-04 (×3): 25 mg via ORAL
  Filled 2018-01-01 (×3): qty 1

## 2018-01-01 MED ORDER — SODIUM CHLORIDE (HYPERTONIC) 2 % OP SOLN
1.0000 [drp] | Freq: Three times a day (TID) | OPHTHALMIC | Status: DC
  Administered 2018-01-01 – 2018-01-04 (×8): 1 [drp] via OPHTHALMIC
  Filled 2018-01-01: qty 15

## 2018-01-01 MED ORDER — ACETAMINOPHEN 160 MG/5ML PO SOLN: 650.0000 mg | ORAL | Status: DC | PRN

## 2018-01-01 MED ORDER — ENALAPRIL MALEATE 10 MG PO TABS
10.0000 mg | ORAL_TABLET | Freq: Every day | ORAL | Status: DC
  Administered 2018-01-02 – 2018-01-04 (×3): 10 mg via ORAL
  Filled 2018-01-01 (×3): qty 1

## 2018-01-01 MED ORDER — ACETAMINOPHEN 650 MG RE SUPP: 650.0000 mg | RECTAL | Status: DC | PRN

## 2018-01-01 MED ORDER — ATORVASTATIN CALCIUM 80 MG PO TABS
80.0000 mg | ORAL_TABLET | Freq: Every evening | ORAL | Status: DC
  Administered 2018-01-01 – 2018-01-03 (×3): 80 mg via ORAL
  Filled 2018-01-01 (×3): qty 1

## 2018-01-01 NOTE — H&P (Signed)
History and Physical    Stephanie Simmons NWG:[id number] DOB: 1940/09/09 DOA: 01/01/2018  Referring MD/NP/PA: Davonna Belling MD  PCP: Leighton Ruff, MD  Patient coming from: Home via EMS  Chief Complaint: Weakness  I have personally briefly reviewed patient's old medical records in [place]   HPI: Stephanie Simmons is a 77 y.o. female with medical history significant of HTN, HLD, CHF, Afib on eliquis; who presents with complains of weakness over the last 2 weeks. Patient at baseline able to ambulate without need of assistance.  However has complained of progressive worsening non-focal weakness to the point where she needs assistance to ambulate. Difficulty to illicit a history from patient.  She complains of headache, chest pain, abdominal pain, shortness of breath, orthopnea and lower extremity swelling. It isn't clear if patient has been taking medications like prescribed.    ED Course: Upon admission emergency department patient was noted to have vital signs relatively within normal limits.  Labs revealed BNP 1139.4 and troponin 0.13.  Chest x-ray revealed a retrocardiac left base collapse/consolidation with possible volume loss of the left hemithorax with small left pleural effusion.  CT scan of the brain showedan area of low-attenuation in the right sella bilateral hemisphere with regional mass-effect question to be acute versus subacute infarct.  Neurology was consulted and recommended completion of stroke work-up.  TRH called to admit.  Review of Systems  Constitutional: Positive for malaise/fatigue. Negative for fever.  HENT: Negative for congestion and sore throat.   Eyes: Negative for blurred vision and double vision.  Respiratory: Positive for cough and shortness of breath.   Cardiovascular: Positive for chest pain, orthopnea and leg swelling.  Gastrointestinal: Positive for abdominal pain. Negative for vomiting.  Genitourinary: Negative for dysuria.  Musculoskeletal:  Negative for falls.  Neurological: Positive for weakness and headaches.    Past Medical History:  Diagnosis Date  . Chest pain    a. 04/2009 Cath: essentially nl cors, EF 65%, mod LVH.  Marland Kitchen GERD (gastroesophageal reflux disease)   . Hyperlipidemia   . Hypertension   . Hypothyroidism    does not take medication for this any longer per patient   . LVH (left ventricular hypertrophy)    a. 04/2009 Echo: EF 55-65%, mild conc LVH, no reg wma, Gr 1 DD, mild MR.  . Monocytosis 04/21/2014    Past Surgical History:  Procedure Laterality Date  . ABDOMINAL HYSTERECTOMY    . LOOP RECORDER INSERTION N/A 10/16/2017   Procedure: LOOP RECORDER INSERTION;  Surgeon: Evans Lance, MD;  Location: [facility];  Service: Cardiovascular;  Laterality: N/A;  . removal of mass- pleural       reports that she quit smoking about 25 years ago. Her smoking use included cigarettes. She has never used smokeless tobacco. She reports that she does not drink alcohol or use drugs.  No Known Allergies  Family History  Problem Relation Age of Onset  . Other Mother        s/p PPM, died @ 3  . Heart attack Father        died @ 45.  . Other Father        enlarged heart   . Stroke Sister     Prior to Admission medications   Medication Sig Start Date End Date Taking? Authorizing Provider  acetaminophen (TYLENOL) 500 MG tablet Take 500 mg by mouth every 6 (six) hours as needed (pain).   Yes [provider]  atorvastatin (LIPITOR) 10 MG tablet  Take 1 tablet (10 mg total) by mouth every evening. 07/03/17  Yes Evans Lance, MD  bimatoprost (LUMIGAN) 0.01 % SOLN Place 1 drop into both eyes at bedtime.   Yes [provider]  chlorhexidine (PERIDEX) 0.12 % solution Use as directed 15 mLs in the mouth or throat 2 (two) times daily.   Yes [provider]  Cholecalciferol (VITAMIN D) 2000 units tablet Take 2,000 Units by mouth daily.   Yes [provider]  dextromethorphan  (DELSYM) 30 MG/5ML liquid Take 60 mg by mouth as needed for cough.   Yes [provider]  diltiazem (CARDIZEM CD) 240 MG 24 hr capsule TAKE 1 CAPSULE (240 MG TOTAL) BY MOUTH DAILY. 10/01/17  Yes Evans Lance, MD  dorzolamide-timolol (COSOPT) 22.3-6.8 MG/ML ophthalmic solution Place 1 drop into both eyes 2 (two) times daily.  08/28/17  Yes [provider]  ELIQUIS 5 MG TABS tablet TAKE 1 TABLET BY MOUTH TWICE A DAY 12/25/17  Yes Evans Lance, MD  enalapril (VASOTEC) 10 MG tablet Take 1 1/2 tablets by mouth daily Patient taking differently: Take 10 mg by mouth daily.  10/01/17  Yes Evans Lance, MD  Esomeprazole Magnesium (NEXIUM 24HR) 20 MG TBEC Take 20 mg by mouth daily.    Yes [provider]  FLUoxetine (PROZAC) 20 MG capsule Take 20 mg by mouth daily. 12/26/17  Yes [provider]  furosemide (LASIX) 40 MG tablet TAKE 1 TABLET BY MOUTH EVERY DAY 11/13/17  Yes Evans Lance, MD  KLOR-CON M20 20 MEQ tablet TAKE 2 TABLETS (40 MEQ TOTAL) BY MOUTH 2 (TWO) TIMES DAILY. 10/01/17  Yes Evans Lance, MD  metoprolol succinate (TOPROL-XL) 25 MG 24 hr tablet TAKE 1 TABLET (25 MG TOTAL) BY MOUTH DAILY. TAKE WITH OR IMMEDIATELY FOLLOWING A MEAL. 10/01/17  Yes Evans Lance, MD  Polyethyl Glycol-Propyl Glycol (SYSTANE) 0.4-0.3 % SOLN Place 1 drop into both eyes as needed (dry eyes).    Yes [provider]  Sodium Chloride, Hypertonic, (MURO 128 OP) Place 1 drop into both eyes 3 (three) times daily.    Yes [provider]    Physical Exam:  Constitutional: NAD, calm, comfortable Vitals:   01/01/18 1530 01/01/18 1547 01/01/18 1615 01/01/18 1630  BP: (!) 145/93  (!) 144/96 (!) 139/94  Pulse: 73  72 70  Resp: 16  15 11   Temp:  97.6 F (36.4 C)    TempSrc:      SpO2: 93%  92% 93%  Weight:      Height:       Eyes: PERRL, lids and conjunctivae normal ENMT: Mucous membranes are moist. Posterior pharynx clear of any exudate or lesions.Normal dentition.    Neck: normal, supple, no masses, no thyromegaly Respiratory:course breath sounds with bilateral crackles in the bases, normal oxygenation.   Cardiovascular: irregular irregular, no murmurs / rubs / gallops. +1 pitting edema bilaterally, . 2+ pedal pulses. No carotid bruits.  Abdomen: no tenderness, no masses palpated. No hepatosplenomegaly. Bowel sounds positive.  Musculoskeletal: no clubbing / cyanosis. No joint deformity upper and lower extremities. Good ROM, no contractures. Normal muscle tone.  Skin: no rashes, lesions, ulcers. No induration Neurologic: CN 2-12 grossly intact.weakness more on the left upper and lower extremity compared to right.   Psychiatric: Normal judgment and insight. Alert and oriented x 3. Normal mood.     Labs on Admission: I have personally reviewed following labs and imaging studies  CBC: Recent Labs  Lab 01/01/18 1440  WBC 7.5  HGB 13.8  HCT 45.2  MCV 81.6  PLT 073   Basic Metabolic Panel: Recent Labs  Lab 01/01/18 1440  NA 136  K 4.9  CL 104  CO2 21*  GLUCOSE 85  BUN 16  CREATININE 0.86  CALCIUM 9.2   GFR: Estimated Creatinine Clearance: 43.3 mL/min (by C-G formula based on SCr of 0.86 mg/dL). Liver Function Tests: Recent Labs  Lab 01/01/18 1440  AST 29  ALT 27  ALKPHOS 43  BILITOT 1.4*  PROT 5.6*  ALBUMIN 3.3*   No results for input(s): LIPASE, AMYLASE in the last 168 hours. No results for input(s): AMMONIA in the last 168 hours. Coagulation Profile: No results for input(s): INR, PROTIME in the last 168 hours. Cardiac Enzymes: Recent Labs  Lab 01/01/18 1440  TROPONINI 0.13*   BNP (last 3 results) No results for input(s): PROBNP in the last 8760 hours. HbA1C: No results for input(s): HGBA1C in the last 72 hours. CBG: No results for input(s): GLUCAP in the last 168 hours. Lipid Profile: No results for input(s): CHOL, HDL, LDLCALC, TRIG, CHOLHDL, LDLDIRECT in the last 72 hours. Thyroid Function Tests: No results for  input(s): TSH, T4TOTAL, FREET4, T3FREE, THYROIDAB in the last 72 hours. Anemia Panel: No results for input(s): VITAMINB12, FOLATE, FERRITIN, TIBC, IRON, RETICCTPCT in the last 72 hours. Urine analysis:    Component Value Date/Time   COLORURINE YELLOW 04/26/2009 1213   APPEARANCEUR CLEAR 04/26/2009 1213   LABSPEC 1.012 04/26/2009 1213   PHURINE 7.0 04/26/2009 1213   GLUCOSEU NEGATIVE 04/26/2009 1213   HGBUR MODERATE (A) 04/26/2009 1213   BILIRUBINUR NEGATIVE 04/26/2009 1213   [place] 04/26/2009 1213   PROTEINUR NEGATIVE 04/26/2009 1213   UROBILINOGEN 0.2 04/26/2009 1213   NITRITE NEGATIVE 04/26/2009 1213   LEUKOCYTESUR NEGATIVE 04/26/2009 1213   Sepsis Labs: No results found for this or any previous visit (from the past 240 hour(s)).   Radiological Exams on Admission: Dg Chest 2 View  Result Date: 01/01/2018 CLINICAL DATA:  Chest pain EXAM: CHEST - 2 VIEW COMPARISON:  07/27/2016 FINDINGS: AP and lateral views of the chest show a hyperexpansion. The cardio pericardial silhouette is enlarged. Left base collapse/consolidation is associated with small left pleural effusion. May be some volume loss in the left hemithorax is well. Staple line again noted at the left apex. Bones are diffusely demineralized. Thoracolumbar scoliosis again noted. Telemetry leads overlie the chest. IMPRESSION: Retrocardiac left base collapse/consolidation with possible volume loss in the left hemithorax. Tiny left pleural effusion. Electronically Signed   By: Misty Stanley M.D.   On: 01/01/2018 15:06   Ct Head Wo Contrast  Result Date: 01/01/2018 CLINICAL DATA:  Generalized weakness for 3 days. Recent diagnosis of atrial fibrillation. EXAM: CT HEAD WITHOUT CONTRAST TECHNIQUE: Contiguous axial images were obtained from the base of the skull through the vertex without intravenous contrast. COMPARISON:  None. FINDINGS: Brain: No subdural, epidural, or subarachnoid hemorrhage. There is a rounded region of low  attenuation in the right cerebellar hemisphere with mild regional mass effect. No associated hemorrhage. The remainder of the cerebellum is normal. The brainstem and basal cisterns are normal. Ventricles and sulci are unremarkable. No acute cortical ischemia or infarct otherwise seen. No midline shift. Vascular: Calcified atherosclerosis in the intracranial carotids. Skull: Normal. Negative for fracture or focal lesion. Sinuses/Orbits: No acute finding. Other: None. IMPRESSION: 1. Rounded region of low attenuation in the right cerebellar hemisphere with mild regional mass effect but no associated  bleed is most consistent with an infarct, acute to subacute. 2. No other acute abnormalities. Findings called to Dr. Alvino Chapel. Electronically Signed   By: Dorise Bullion III M.D   On: 01/01/2018 15:31    EKG: Independently reviewed. afib at 78 beats per minute   Assessment/Plan CVA: Acute. Patient presents with weakness found to be more pronounced on the left on physical exam. CT reveals acute/subacute infarct in the right cerebellar hemisphere.  Neurology consulted, but patient out of window for any acute interventions. - Admit to telemetry bed - Stroke order set initiated - Neuro checks - Check  MRI/MRA head w/o contrast - PT/OT/Speech to eval and treat - Check echocardiogram - Check Hemoglobin A1c and lipid panel in a.m. - Appreciate neurology consultative services, will follow-up  - Social work consult   Congestive heart failure exacerbation: X-ray showing cardiomegaly with left-sided consolidation and tiny pleural effusion.  BNP elevated at 1139.4.  Last echocardiogram showing EF of 65 to 70% on 12/20/2017. - Heart failure orders set  initiated  - Continuous pulse oximetry with nasal cannula oxygen as needed to keep O2 saturations >92% - Strict I&Os and daily weights - Elevate lower extremities - Lasix 40 mg x1 dose - Reassess in a.m. and adjust diuresis as needed. - Check echocardiogram -  Optimize medical management as able - Message sent for cardiology to eval in a.m. in a.m.   Chest pain with elevated troponin: Acute.  Initial troponin elevated 0.13 on admission.  Patient reports having complaints of chest pain. - Trend cardiac troponin - ASA x1   Atrial fibrillation: chronic.  Currently rate controlled.  Unclear if patient's been taking Eliquis as prescribed. - Continue Cardizem and Eliquis   Essential hypertension  - continue metoprolol and enalapril  History of hypothyroidism: Patient reportedly no longer on medication for treatment. - Add-on TSH  Hyperlipidemia - Increase atorvastatin to 80 mg daily    DVT prophylaxis: Eliquis Code Status: Full Family Communication: No family present at bedside Disposition Plan: To be determined Consults called: Neurology Admission status: Inpatient  Norval Morton MD [facility] Pager [phone]   If 7PM-7AM, please contact night-coverage [email or web address] Password [email or web address]  01/01/2018, 5:13 PM

## 2018-01-01 NOTE — Progress Notes (Signed)
Pt off unit to MRI.

## 2018-01-01 NOTE — Progress Notes (Signed)
Pt arrived to unit, AOx4. VSS. MD notified.

## 2018-01-01 NOTE — Telephone Encounter (Signed)
Outreach made to Pt.  Per Pt she is sleepy and doesn't feel well. Pt asked this nurse to call her later.

## 2018-01-01 NOTE — ED Provider Notes (Signed)
[facility] EM[facility]ENC[facility] DEPARTMENT Provider Note   CSN: [id number] Arrival date & time: 01/01/18  1344     History   Chief Complaint Chief Complaint  Patient presents with  . Weakness    HPI Stephanie Simmons is a 77 y.o. female.  HPI Patient presents with generalized weakness.  Has had for the last few days.  Has had decreased oral intake.  Has an apparent left-sided facial droop.  History of atrial fibrillation.  Reportedly has some questionable compliance with her medications.  Per patient's sister patient has not been eating and drinking well a lot.  Has some dull upper abdominal pain.  No fevers.  No dysuria.  Reportedly has been having more difficulty getting out of bed also. Past Medical History:  Diagnosis Date  . Chest pain    a. 04/2009 Cath: essentially nl cors, EF 65%, mod LVH.  Marland Kitchen GERD (gastroesophageal reflux disease)   . Hyperlipidemia   . Hypertension   . Hypothyroidism    does not take medication for this any longer per patient   . LVH (left ventricular hypertrophy)    a. 04/2009 Echo: EF 55-65%, mild conc LVH, no reg wma, Gr 1 DD, mild MR.  . Monocytosis 04/21/2014    Patient Active Problem List   Diagnosis Date Noted  . Chest pain, atypical 09/23/2014  . Acute on chronic diastolic CHF (congestive heart failure), NYHA class 3 ([place]) 09/23/2014  . Acute diastolic heart failure ([place]) 09/23/2014  . Monocytosis 04/21/2014  . Neurofibromatosis ([place]) 04/21/2014  . Poor oral hygiene 04/21/2014  . Atrial fibrillation ([place]) 04/15/2014  . PSVT (paroxysmal supraventricular tachycardia) ([place]) 09/25/2013  . Syncope 09/10/2013  . Atrial flutter ([place]) 09/10/2013  . HTN (hypertension) 09/10/2013    Past Surgical History:  Procedure Laterality Date  . ABDOMINAL HYSTERECTOMY    . LOOP RECORDER INSERTION N/A 10/16/2017   Procedure: LOOP RECORDER INSERTION;  Surgeon: Evans Lance, MD;  Location: [facility];  Service: Cardiovascular;   Laterality: N/A;  . removal of mass- pleural       OB History   None      Home Medications    Prior to Admission medications   Medication Sig Start Date End Date Taking? Authorizing Provider  acetaminophen (TYLENOL) 500 MG tablet Take 500 mg by mouth every 6 (six) hours as needed (pain).   Yes [provider]  bimatoprost (LUMIGAN) 0.01 % SOLN Place 1 drop into both eyes at bedtime.   Yes [provider]  chlorhexidine (PERIDEX) 0.12 % solution Use as directed 15 mLs in the mouth or throat 2 (two) times daily.   Yes [provider]  CHOLECALCIFEROL PO Take 2,000 mg by mouth daily.    Yes [provider]  dextromethorphan (DELSYM) 30 MG/5ML liquid Take 60 mg by mouth as needed for cough.   Yes [provider]  dorzolamide-timolol (COSOPT) 22.3-6.8 MG/ML ophthalmic solution Place 1 drop into both eyes 2 (two) times daily.  08/28/17  Yes [provider]  ELIQUIS 5 MG TABS tablet TAKE 1 TABLET BY MOUTH TWICE A DAY 12/25/17  Yes Evans Lance, MD  Esomeprazole Magnesium (NEXIUM 24HR) 20 MG TBEC Take 20 mg by mouth daily.    Yes [provider]  KLOR-CON M20 20 MEQ tablet TAKE 2 TABLETS (40 MEQ TOTAL) BY MOUTH 2 (TWO) TIMES DAILY. 10/01/17  Yes Evans Lance, MD  Polyethyl Glycol-Propyl Glycol (SYSTANE) 0.4-0.3 % SOLN Apply 1 drop to eye as  needed (dry eyes).   Yes [provider]  Sodium Chloride, Hypertonic, (MURO 128 OP) Place 1 drop into both eyes 3 (three) times daily.    Yes [provider]  atorvastatin (LIPITOR) 10 MG tablet Take 1 tablet (10 mg total) by mouth every evening. 07/03/17   Evans Lance, MD  diltiazem (CARDIZEM CD) 240 MG 24 hr capsule TAKE 1 CAPSULE (240 MG TOTAL) BY MOUTH DAILY. 10/01/17   Evans Lance, MD  enalapril (VASOTEC) 10 MG tablet Take 1 1/2 tablets by mouth daily Patient taking differently: Take 10 mg by mouth daily. Take 1 1/2 tablets by mouth daily 10/01/17   Evans Lance, MD    FLUoxetine (PROZAC) 20 MG capsule Take 20 mg by mouth daily. 12/26/17   [provider]  furosemide (LASIX) 40 MG tablet TAKE 1 TABLET BY MOUTH EVERY DAY 11/13/17   Evans Lance, MD  metoprolol succinate (TOPROL-XL) 25 MG 24 hr tablet TAKE 1 TABLET (25 MG TOTAL) BY MOUTH DAILY. TAKE WITH OR IMMEDIATELY FOLLOWING A MEAL. 10/01/17   Evans Lance, MD  Polyethyl Glycol-Propyl Glycol (SYSTANE OP) 1 drop See admin instructions. Once a day into affected eye(s) as needed    [provider]    Family History Family History  Problem Relation Age of Onset  . Other Mother        s/p PPM, died @ 62  . Heart attack Father        died @ 27.  . Other Father        enlarged heart   . Stroke Sister     Social History Social History   Tobacco Use  . Smoking status: Former Smoker    Types: Cigarettes    Last attempt to quit: 06/26/1992    Years since quitting: 25.5  . Smokeless tobacco: Never Used  Substance Use Topics  . Alcohol use: No  . Drug use: No     Allergies   Patient has no known allergies.   Review of Systems Review of Systems  Constitutional: Positive for appetite change and fatigue.  HENT: Negative for congestion.   Respiratory: Negative for shortness of breath.   Cardiovascular: Negative for chest pain.  Gastrointestinal: Positive for abdominal pain.  Genitourinary: Negative for flank pain.  Musculoskeletal: Negative for back pain.  Skin: Negative for rash.  Neurological: Positive for weakness. Negative for speech difficulty and headaches.  Psychiatric/Behavioral: Positive for confusion.     Physical Exam Updated Vital Signs BP 140/90 (BP Location: Right Arm)   Pulse 87   Temp (!) 97.5 F (36.4 C) (Oral)   Resp 16   Ht 5\' 2"  (1.575 m)   Wt 53.5 kg (118 lb)   SpO2 98% Comment: 2l/[place]  BMI 21.58 kg/m   Physical Exam  Constitutional: She appears well-developed.  HENT:  Head: Atraumatic.  Left-sided facial droop.  Neck: Neck supple.   Cardiovascular:  Irregular rhythm.  Pulmonary/Chest: Effort normal.  Abdominal: There is tenderness.  Minimal upper abdominal tenderness without rebound or guarding.  No mass.  Musculoskeletal: She exhibits no edema.  Neurological: She is alert.  Left-sided facial droop forehead appears to be involved..  Eye movements intact.  Somewhat generalized weakness but does have grip strength equal bilaterally.  Able to raise both heels up off the bed.  Awake and may have some mild confusion.  Skin: Skin is warm.  Psychiatric: She has a normal mood and affect.     ED Treatments /  Results  Labs (all labs ordered are listed, but only abnormal results are displayed) Labs Reviewed  CBC - Abnormal; Notable for the following components:      Result Value   RBC 5.54 (*)    MCH 24.9 (*)    RDW 18.2 (*)    All other components within normal limits  BASIC METABOLIC PANEL  BRAIN NATRIURETIC PEPTIDE  HEPATIC FUNCTION PANEL  TROPONIN I  RAPID URINE DRUG SCREEN, HOSP PERFORMED  PROTIME-INR  APTT    EKG EKG Interpretation  Date/Time:  Tuesday January 01 2018 13:46:23 EDT Ventricular Rate:  78 PR Interval:    QRS Duration: 95 QT Interval:  358 QTC Calculation: 408 R Axis:   169 Text Interpretation:  Atrial fibrillation Right axis deviation Nonspecific T abnormalities, inferior leads Confirmed by Davonna Belling [phone]) on 01/01/2018 2:30:32 PM   Radiology Dg Chest 2 View  Result Date: 01/01/2018 CLINICAL DATA:  Chest pain EXAM: CHEST - 2 VIEW COMPARISON:  07/27/2016 FINDINGS: AP and lateral views of the chest show a hyperexpansion. The cardio pericardial silhouette is enlarged. Left base collapse/consolidation is associated with small left pleural effusion. May be some volume loss in the left hemithorax is well. Staple line again noted at the left apex. Bones are diffusely demineralized. Thoracolumbar scoliosis again noted. Telemetry leads overlie the chest. IMPRESSION: Retrocardiac left base  collapse/consolidation with possible volume loss in the left hemithorax. Tiny left pleural effusion. Electronically Signed   By: Misty Stanley M.D.   On: 01/01/2018 15:06   Ct Head Wo Contrast  Result Date: 01/01/2018 CLINICAL DATA:  Generalized weakness for 3 days. Recent diagnosis of atrial fibrillation. EXAM: CT HEAD WITHOUT CONTRAST TECHNIQUE: Contiguous axial images were obtained from the base of the skull through the vertex without intravenous contrast. COMPARISON:  None. FINDINGS: Brain: No subdural, epidural, or subarachnoid hemorrhage. There is a rounded region of low attenuation in the right cerebellar hemisphere with mild regional mass effect. No associated hemorrhage. The remainder of the cerebellum is normal. The brainstem and basal cisterns are normal. Ventricles and sulci are unremarkable. No acute cortical ischemia or infarct otherwise seen. No midline shift. Vascular: Calcified atherosclerosis in the intracranial carotids. Skull: Normal. Negative for fracture or focal lesion. Sinuses/Orbits: No acute finding. Other: None. IMPRESSION: 1. Rounded region of low attenuation in the right cerebellar hemisphere with mild regional mass effect but no associated bleed is most consistent with an infarct, acute to subacute. 2. No other acute abnormalities. Findings called to Dr. Alvino Chapel. Electronically Signed   By: Dorise Bullion III M.D   On: 01/01/2018 15:31    Procedures Procedures (including critical care time)  Medications Ordered in ED Medications - No data to display   Initial Impression / Assessment and Plan / ED Course  I have reviewed the triage vital signs and the nursing notes.  Pertinent labs & imaging results that were available during my care of the patient were reviewed by me and considered in my medical decision making (see chart for details).     Patient with generalized weakness.  Also new facial droop over the last 3 days.  Atrial fibrillation on anticoagulation but  some questionable compliance.  Head CT shows likely new stroke.  Will have seen by neurology.  Not a TPA candidate due to time of onset.  Labs pending but will require admission to internal medicine.  Care will be turned over to Dr. Rex Kras.  Final Clinical Impressions(s) / ED Diagnoses   Final diagnoses:  Cerebrovascular  accident (CVA), unspecified mechanism [facility])    ED Discharge Orders    None       Davonna Belling, MD 01/01/18 1546

## 2018-01-01 NOTE — Consult Note (Addendum)
Requesting Physician: Dr. Charisse March    Chief Complaint: Failure to thrive and stroke found on CT  History obtained from: Sister  HPI:                                                                                                                                         Stephanie Simmons is an 77 y.o. female hyperlipidemia, hypertension, left ventricular heart failure.  Patient sister has note for last few days she has had decreased oral intake and an apparent left facial droop.  Patient is also on Eliquis with chronic A. fib.  There is question if she is compliant with her medications.  The patient's failure to thrive, decreased oral intake, abdominal pain and facial droop she was brought to the emergency department today by her sister.  Patient obtained a CT which showed right cerebellar hemisphere subacute stroke.  Date last known well: Unable to determine Time last known well: Unable to determine tPA Given: No: Out of the window NIH stroke scale 2  Modified Rankin: Rankin Score=3   Past Medical History:  Diagnosis Date  . Chest pain    a. 04/2009 Cath: essentially nl cors, EF 65%, mod LVH.  Marland Kitchen GERD (gastroesophageal reflux disease)   . Hyperlipidemia   . Hypertension   . Hypothyroidism    does not take medication for this any longer per patient   . LVH (left ventricular hypertrophy)    a. 04/2009 Echo: EF 55-65%, mild conc LVH, no reg wma, Gr 1 DD, mild MR.  . Monocytosis 04/21/2014    Past Surgical History:  Procedure Laterality Date  . ABDOMINAL HYSTERECTOMY    . LOOP RECORDER INSERTION N/A 10/16/2017   Procedure: LOOP RECORDER INSERTION;  Surgeon: Evans Lance, MD;  Location: [facility];  Service: Cardiovascular;  Laterality: N/A;  . removal of mass- pleural      Family History  Problem Relation Age of Onset  . Other Mother        s/p PPM, died @ 64  . Heart attack Father        died @ 68.  . Other Father        enlarged heart   . Stroke Sister           Social History:  reports that she quit smoking about 25 years ago. Her smoking use included cigarettes. She has never used smokeless tobacco. She reports that she does not drink alcohol or use drugs.  Allergies: No Known Allergies  Medications:  No current facility-administered medications for this encounter.    Current Outpatient Medications  Medication Sig Dispense Refill  . acetaminophen (TYLENOL) 500 MG tablet Take 500 mg by mouth every 6 (six) hours as needed (pain).    Marland Kitchen atorvastatin (LIPITOR) 10 MG tablet Take 1 tablet (10 mg total) by mouth every evening. 90 tablet 2  . bimatoprost (LUMIGAN) 0.01 % SOLN Place 1 drop into both eyes at bedtime.    . chlorhexidine (PERIDEX) 0.12 % solution Use as directed 15 mLs in the mouth or throat 2 (two) times daily.    . Cholecalciferol (VITAMIN D) 2000 units tablet Take 2,000 Units by mouth daily.    Marland Kitchen dextromethorphan (DELSYM) 30 MG/5ML liquid Take 60 mg by mouth as needed for cough.    . diltiazem (CARDIZEM CD) 240 MG 24 hr capsule TAKE 1 CAPSULE (240 MG TOTAL) BY MOUTH DAILY. 90 capsule 3  . dorzolamide-timolol (COSOPT) 22.3-6.8 MG/ML ophthalmic solution Place 1 drop into both eyes 2 (two) times daily.     Marland Kitchen ELIQUIS 5 MG TABS tablet TAKE 1 TABLET BY MOUTH TWICE A DAY 60 tablet 6  . enalapril (VASOTEC) 10 MG tablet Take 1 1/2 tablets by mouth daily (Patient taking differently: Take 10 mg by mouth daily. ) 135 tablet 3  . Esomeprazole Magnesium (NEXIUM 24HR) 20 MG TBEC Take 20 mg by mouth daily.     Marland Kitchen FLUoxetine (PROZAC) 20 MG capsule Take 20 mg by mouth daily.  1  . furosemide (LASIX) 40 MG tablet TAKE 1 TABLET BY MOUTH EVERY DAY 90 tablet 3  . KLOR-CON M20 20 MEQ tablet TAKE 2 TABLETS (40 MEQ TOTAL) BY MOUTH 2 (TWO) TIMES DAILY. 360 tablet 3  . metoprolol succinate (TOPROL-XL) 25 MG 24 hr tablet TAKE 1 TABLET (25 MG TOTAL)  BY MOUTH DAILY. TAKE WITH OR IMMEDIATELY FOLLOWING A MEAL. 90 tablet 3  . Polyethyl Glycol-Propyl Glycol (SYSTANE) 0.4-0.3 % SOLN Place 1 drop into both eyes as needed (dry eyes).     . Sodium Chloride, Hypertonic, (MURO 128 OP) Place 1 drop into both eyes 3 (three) times daily.        ROS:                                                                                                                                       History obtained from the patient and Patient's daughter  General ROS: negative for - chills, fatigue, fever, night sweats, weight gain or weight loss Psychological ROS: negative for - , hallucinations, memory difficulties, mood swings or  Ophthalmic ROS: negative for - blurry vision, double vision, eye pain or loss of vision ENT ROS: negative for - epistaxis, nasal discharge, oral lesions, sore throat, tinnitus or vertigo Respiratory ROS: negative for - cough,  shortness of breath or wheezing Cardiovascular ROS: negative for - chest pain, dyspnea on exertion,  Gastrointestinal ROS: negative for -  abdominal pain, diarrhea,  nausea/vomiting or stool incontinence Genito-Urinary ROS: negative for - dysuria, hematuria, incontinence or urinary frequency/urgency Musculoskeletal ROS: negative for - joint swelling or muscular weakness Neurological ROS: as noted in HPI   General Examination:                                                                                                      Blood pressure (!) 144/96, pulse 72, temperature 97.6 F (36.4 C), resp. rate 15, height 5\' 2"  (1.575 m), weight 53.5 kg (118 lb), SpO2 92 %.  HEENT-  Normocephalic, no lesions, without obvious abnormality.  Normal external eye and conjunctiva.   Extremities- Warm, dry and intact Musculoskeletal-no joint tenderness, deformity or swelling Skin-warm and dry, no hyperpigmentation, vitiligo, or suspicious lesions  Neurological Examination Mental Status: Alert, oriented, thought content  appropriate.  Speech dysarthric and patient is adentulous with dentures.  At present time her dentures are not fitting well and are loose which may be adding to her dysarthria.  Sister states that her voice almost sounds normal without evidence of aphasia.  Able to follow 2 step commands but has difficulty with three-step exams Cranial Nerves: II:  Visual fields grossly normal,  III,IV, VI: ptosis not present, extra-ocular motions intact bilaterally, pupils equal, round, reactive to light and accommodation--it is notable that patient does have corneal clouding bilaterally V,VII: Left facial droop, facial light touch sensation normal bilaterally VIII: Question decreased hearing as I had a repeat my sentences sometimes twice IX,X: uvula rises symmetrically XI: bilateral shoulder shrug XII: midline tongue extension Motor: To 5 strength throughout.  At times she did not understand what she was doing and had give way strength but after understanding exactly what I am asking she did have 4/5 strength. Sensory: Pinprick and light touch intact throughout, bilaterally Deep Tendon Reflexes: 2+ and symmetric throughout Plantars: Right: downgoing   Left: downgoing Cerebellar: normal finger-to-nose, fine motor movements such as fast tapping of fingers or finger to finger did show some clumsiness on the right.  And normal heel-to-shin test Gait: Not tested   Lab Results: Basic Metabolic Panel: Recent Labs  Lab 01/01/18 1440  NA 136  K 4.9  CL 104  CO2 21*  GLUCOSE 85  BUN 16  CREATININE 0.86  CALCIUM 9.2    CBC: Recent Labs  Lab 01/01/18 1440  WBC 7.5  HGB 13.8  HCT 45.2  MCV 81.6  PLT 245    Lipid Panel: No results for input(s): CHOL, TRIG, HDL, CHOLHDL, VLDL, LDLCALC in the last 168 hours.  CBG: No results for input(s): GLUCAP in the last 168 hours.  Imaging: Dg Chest 2 View  Result Date: 01/01/2018 CLINICAL DATA:  Chest pain EXAM: CHEST - 2 VIEW COMPARISON:  07/27/2016  FINDINGS: AP and lateral views of the chest show a hyperexpansion. The cardio pericardial silhouette is enlarged. Left base collapse/consolidation is associated with small left pleural effusion. May be some volume loss in the left hemithorax is well. Staple line again noted at the left apex. Bones are diffusely demineralized. Thoracolumbar scoliosis again noted. Telemetry  leads overlie the chest. IMPRESSION: Retrocardiac left base collapse/consolidation with possible volume loss in the left hemithorax. Tiny left pleural effusion. Electronically Signed   By: Misty Stanley M.D.   On: 01/01/2018 15:06   Ct Head Wo Contrast  Result Date: 01/01/2018 CLINICAL DATA:  Generalized weakness for 3 days. Recent diagnosis of atrial fibrillation. EXAM: CT HEAD WITHOUT CONTRAST TECHNIQUE: Contiguous axial images were obtained from the base of the skull through the vertex without intravenous contrast. COMPARISON:  None. FINDINGS: Brain: No subdural, epidural, or subarachnoid hemorrhage. There is a rounded region of low attenuation in the right cerebellar hemisphere with mild regional mass effect. No associated hemorrhage. The remainder of the cerebellum is normal. The brainstem and basal cisterns are normal. Ventricles and sulci are unremarkable. No acute cortical ischemia or infarct otherwise seen. No midline shift. Vascular: Calcified atherosclerosis in the intracranial carotids. Skull: Normal. Negative for fracture or focal lesion. Sinuses/Orbits: No acute finding. Other: None. IMPRESSION: 1. Rounded region of low attenuation in the right cerebellar hemisphere with mild regional mass effect but no associated bleed is most consistent with an infarct, acute to subacute. 2. No other acute abnormalities. Findings called to Dr. Alvino Chapel. Electronically Signed   By: Dorise Bullion III M.D   On: 01/01/2018 15:31    Assessment and plan discussed with with attending physician and they are in agreement.    Etta Quill  PA-C [facility] [phone]  01/01/2018, 4:31 PM   Assessment: 77 y.o. female tenting with subacute right cerebellar stroke.  Likely embolic due to afib, but will need vascular imaging as well. Given the size and location, I would hold off on anticoagulation for the time being.   Stroke Risk Factors - hyperlipidemia and hypertension  Recommend --HgbA1c, fasting lipid panel --MRI of the brain without contrast --PT consult, OT consult, Speech consult --Echocardiogram --CTA head and neck --80 mg of Atorvistatin --Prophylactic therapy-Antiplatelet med: --hold Eliquis and start ASA 325 for now--Telemetry monitoring --Frequent neuro checks --NPO until passes stroke swallow screen --please page stroke NP  Or  PA  Or MD from 8am -4 pm  as this patient from this time will be  followed by the stroke.   You can look them up on [email or web address]  Password [email or web address]  Roland Rack, MD [facility] [phone]  If 7pm- 7am, please page neurology on call as listed in [place].

## 2018-01-01 NOTE — Progress Notes (Signed)
PT off unit for CT

## 2018-01-01 NOTE — Progress Notes (Signed)
Unable to complete shift assessment at this time or neuro checks at this time, as pt was in CT at shift change, now is at MRI.  Stephanie Simmons

## 2018-01-01 NOTE — ED Triage Notes (Signed)
Pt arrives EMS from home with c/o generalized weakness x 3 days with recent of afib diagnosis. C/o abdominal pain x 1 weak.

## 2018-01-02 ENCOUNTER — Encounter (HOSPITAL_COMMUNITY): Payer: Self-pay | Admitting: General Practice

## 2018-01-02 ENCOUNTER — Other Ambulatory Visit (HOSPITAL_COMMUNITY): Payer: Medicare Other

## 2018-01-02 ENCOUNTER — Inpatient Hospital Stay (HOSPITAL_COMMUNITY): Payer: Medicare Other

## 2018-01-02 DIAGNOSIS — I639 Cerebral infarction, unspecified: Secondary | ICD-10-CM

## 2018-01-02 DIAGNOSIS — E7800 Pure hypercholesterolemia, unspecified: Secondary | ICD-10-CM

## 2018-01-02 DIAGNOSIS — I5033 Acute on chronic diastolic (congestive) heart failure: Secondary | ICD-10-CM

## 2018-01-02 DIAGNOSIS — R778 Other specified abnormalities of plasma proteins: Secondary | ICD-10-CM | POA: Diagnosis present

## 2018-01-02 DIAGNOSIS — I482 Chronic atrial fibrillation: Secondary | ICD-10-CM

## 2018-01-02 DIAGNOSIS — R748 Abnormal levels of other serum enzymes: Secondary | ICD-10-CM

## 2018-01-02 DIAGNOSIS — E785 Hyperlipidemia, unspecified: Secondary | ICD-10-CM | POA: Diagnosis present

## 2018-01-02 DIAGNOSIS — R7989 Other specified abnormal findings of blood chemistry: Secondary | ICD-10-CM

## 2018-01-02 LAB — CBC WITH DIFFERENTIAL/PLATELET
Abs Immature Granulocytes: 0 10*3/uL (ref 0.0–0.1)
Basophils Absolute: 0.1 10*3/uL (ref 0.0–0.1)
Basophils Relative: 1 %
Eosinophils Absolute: 0.1 10*3/uL (ref 0.0–0.7)
Eosinophils Relative: 2 %
HEMATOCRIT: 44.3 % (ref 36.0–46.0)
HEMOGLOBIN: 13.4 g/dL (ref 12.0–15.0)
Immature Granulocytes: 1 %
Lymphocytes Relative: 17 %
Lymphs Abs: 1.2 10*3/uL (ref 0.7–4.0)
MCH: 24.7 pg — ABNORMAL LOW (ref 26.0–34.0)
MCHC: 30.2 g/dL (ref 30.0–36.0)
MCV: 81.7 fL (ref 78.0–100.0)
Monocytes Absolute: 1.4 10*3/uL — ABNORMAL HIGH (ref 0.1–1.0)
Monocytes Relative: 18 %
Neutro Abs: 4.7 10*3/uL (ref 1.7–7.7)
Neutrophils Relative %: 61 %
Platelets: 252 10*3/uL (ref 150–400)
RBC: 5.42 MIL/uL — AB (ref 3.87–5.11)
RDW: 17.9 % — AB (ref 11.5–15.5)
WBC: 7.5 10*3/uL (ref 4.0–10.5)

## 2018-01-02 LAB — BASIC METABOLIC PANEL
Anion gap: 11 (ref 5–15)
BUN: 12 mg/dL (ref 8–23)
CALCIUM: 9 mg/dL (ref 8.9–10.3)
CO2: 23 mmol/L (ref 22–32)
CREATININE: 0.76 mg/dL (ref 0.44–1.00)
Chloride: 103 mmol/L (ref 98–111)
GFR calc Af Amer: >60 mL/min (ref >60)
Glucose, Bld: 101 mg/dL — ABNORMAL HIGH (ref 70–99)
Potassium: 3.6 mmol/L (ref 3.5–5.1)
Sodium: 137 mmol/L (ref 135–145)

## 2018-01-02 LAB — LIPID PANEL
CHOL/HDL RATIO: 3.7 ratio
Cholesterol: 145 mg/dL (ref 0–200)
HDL: 39 mg/dL — AB (ref >40)
LDL CALC: 94 mg/dL (ref 0–99)
Triglycerides: 59 mg/dL (ref <150)
VLDL: 12 mg/dL (ref 0–40)

## 2018-01-02 LAB — GLUCOSE, CAPILLARY: Glucose-Capillary: 127 mg/dL — ABNORMAL HIGH (ref 70–99)

## 2018-01-02 LAB — HEMOGLOBIN A1C
Hgb A1c MFr Bld: 6.2 % — ABNORMAL HIGH (ref 4.8–5.6)
Mean Plasma Glucose: 131.24 mg/dL

## 2018-01-02 LAB — TROPONIN I: TROPONIN I: 0.1 ng/mL — AB (ref <0.03)

## 2018-01-02 LAB — MAGNESIUM: Magnesium: 1.7 mg/dL (ref 1.7–2.4)

## 2018-01-02 MED ORDER — ASPIRIN 325 MG PO TABS
325.0000 mg | ORAL_TABLET | Freq: Once | ORAL | Status: AC
  Administered 2018-01-02: 325 mg via ORAL
  Filled 2018-01-02: qty 1

## 2018-01-02 MED ORDER — FUROSEMIDE 10 MG/ML IJ SOLN
40.0000 mg | Freq: Every day | INTRAMUSCULAR | Status: DC
  Administered 2018-01-02 – 2018-01-04 (×3): 40 mg via INTRAVENOUS
  Filled 2018-01-02 (×3): qty 4

## 2018-01-02 MED ORDER — TECHNETIUM TO 99M ALBUMIN AGGREGATED
4.0000 | Freq: Once | INTRAVENOUS | Status: AC | PRN
  Administered 2018-01-02: 4 via INTRAVENOUS

## 2018-01-02 MED ORDER — TECHNETIUM TC 99M DIETHYLENETRIAME-PENTAACETIC ACID
31.0000 | Freq: Once | INTRAVENOUS | Status: AC | PRN
  Administered 2018-01-02: 31 via RESPIRATORY_TRACT

## 2018-01-02 NOTE — Evaluation (Signed)
Occupational Therapy Evaluation Patient Details Name: Stephanie Simmons MRN: [id number] DOB: 24-Jul-1940 Today's Date: 01/02/2018    History of Present Illness 77 yo female admitted with history of HTN, HLD, LVHP, AF on eliquis, hypothyroidism and loop placed for syncope presenting with generalized weakness x 2 weeks and L facial weakness.  MRI + R cerebellar / PICA infarct   Clinical Impression   PT admitted with R cerebellar CVA. Pt currently with functional limitiations due to the deficits listed below (see OT problem list). Pt currently requires (A) for basic transfer, LB adls and basic bed mobility. Pt very soft spoken and HOH. Pt reports living alone and sister (A) with driving.  Pt will benefit from skilled OT to increase their independence and safety with adls and balance to allow discharge SNF. Pt will require mOD I for all adls to return home.      Follow Up Recommendations  SNF    Equipment Recommendations  3 in 1 bedside commode    Recommendations for Other Services       Precautions / Restrictions Precautions Precautions: Fall      Mobility Bed Mobility Overal bed mobility: Needs Assistance Bed Mobility: Supine to Sit     Supine to sit: Mod assist     General bed mobility comments: pt able to elevate trunk but requires (A) to progres to EOB with bil LE. pt reports "you have to help me" pt using bil UE on bed rail working toward EOB  Transfers Overall transfer level: Needs assistance Equipment used: 1 person hand held assist Transfers: Sit to/from [facility] Sit to Stand: Min assist Stand pivot transfers: Mod assist       General transfer comment: pt unsteady and requires steady support. pt reports at baseline completing all transfers "no problem""    Balance Overall balance assessment: Needs assistance Sitting-balance support: Bilateral upper extremity supported;Feet supported Sitting balance-Leahy Scale: Fair                                      ADL either performed or assessed with clinical judgement   ADL Overall ADL's : Needs assistance/impaired Eating/Feeding: Modified independent;Bed level Eating/Feeding Details (indicate cue type and reason): pt reports needing to take breaks. pt reports "i am eating backward" pt noted to eat dessert prior to spaghetti lunch Grooming: Wash/dry face;Sitting               Lower Body Dressing: Moderate assistance   Toilet Transfer: Moderate assistance Toilet Transfer Details (indicate cue type and reason): simulated EOB to chair           General ADL Comments: family arriving at the end of session. Sister very vocal and asking questions regarding the RN on duty. Sister educated that she is correct that the same RN is covering the patient at this time.      Vision Baseline Vision/History: Wears glasses Wears Glasses: At all times       Perception     Praxis      Pertinent Vitals/Pain       Hand Dominance Right   Extremity/Trunk Assessment Upper Extremity Assessment Upper Extremity Assessment: Generalized weakness   Lower Extremity Assessment Lower Extremity Assessment: Defer to PT evaluation   Cervical / Trunk Assessment Cervical / Trunk Assessment: Kyphotic   Communication Communication Communication: HOH   Cognition Arousal/Alertness: Awake/alert Behavior During Therapy: WFL for tasks assessed/performed Overall Cognitive Status:  Within Functional Limits for tasks assessed                                     General Comments       Exercises     Shoulder Instructions      Home Living Family/patient expects to be discharged to:: Private residence Living Arrangements: Alone   Type of Home: House Home Access: Elevator     Home Layout: One level     Bathroom Shower/Tub: Occupational psychologist: Standard     Home Equipment: None   Additional Comments: sister and senior ride helps with  transportation      Prior Functioning/Environment Level of Independence: Independent                 OT Problem List: Decreased strength;Decreased activity tolerance;Impaired balance (sitting and/or standing);Decreased safety awareness;Decreased knowledge of use of DME or AE;Decreased knowledge of precautions      OT Treatment/Interventions: Self-care/ADL training;Therapeutic exercise;Energy conservation;DME and/or AE instruction;Therapeutic activities;Cognitive remediation/compensation;Patient/family education;Balance training    OT Goals(Current goals can be found in the care plan section) Acute Rehab OT Goals Patient Stated Goal: none stated at this time OT Goal Formulation: With patient Time For Goal Achievement: 01/16/18 Potential to Achieve Goals: Good  OT Frequency: Min 2X/week   Barriers to D/C: Decreased caregiver support          Co-evaluation              AM-PAC PT "6 Clicks" Daily Activity     Outcome Measure Help from another person eating meals?: None Help from another person taking care of personal grooming?: None Help from another person toileting, which includes using toliet, bedpan, or urinal?: A Little Help from another person bathing (including washing, rinsing, drying)?: A Little Help from another person to put on and taking off regular upper body clothing?: A Little Help from another person to put on and taking off regular lower body clothing?: A Lot 6 Click Score: 19   End of Session Equipment Utilized During Treatment: Gait belt Nurse Communication: Mobility status;Precautions  Activity Tolerance: Patient tolerated treatment well Patient left: in chair;with call bell/phone within reach;with family/visitor present  OT Visit Diagnosis: Unsteadiness on feet (R26.81)                Time: 1638-4536 OT Time Calculation (min): 17 min Charges:  OT General Charges $OT Visit: 1 Visit OT Evaluation $OT Eval Moderate Complexity: 1 Mod G-Codes:       Jeri Modena   OTR/L Pager: [phone] Office: [phone] .   Parke Poisson B 01/02/2018, 1:51 PM

## 2018-01-02 NOTE — Consult Note (Signed)
Cardiology Consultation:   Patient ID: Stephanie Simmons; [id number]; 07/03/1940   Admit date: 01/01/2018 Date of Consult: 01/02/2018  Primary Care Provider: Leighton Ruff, MD Primary Cardiologist: Dr. Cristopher Peru, MD Primary Electrophysiologist: Dr. Cristopher Peru, MD  Patient Profile:   Stephanie Simmons is a 77 y.o. female with a hx of chronic atrial fibrillation on Eliquis, chronic diastolic congestive heart failure, pulmonary hypertension, hypertension and syncope who is being seen today for the evaluation of acute on chronic diastolic HF with elevated troponin at the request of Dr. Tamala Julian with internal medicine service.   History of Present Illness:   Stephanie Simmons is a 77 year old female with a history as stated above who presented to [facility] on 01/01/2018 with complaints of progressive weakness over the last 2 weeks. History is somewhat difficult to follow given acute CVA with some short term memory deficit. There are no family members at bedside. Prior to admission, pt reports living alone (sister lives nearby) and able to ambulate without assistance at baseline however, has more recently complained of progressive, worsening weakness to the point where she needs assistance to ambulate. She reports having intermittent, non radiating chest pain for the last several months. She states that taking her daily medications help to alleviate the discomfort. She cannot currently rate the pain or describe the nature of the pain. Otherwise, she denies LE swelling, palpitations, syncope. She states that with the chest pain, she has dizziness and some SOB however, this is again difficult to follow in her description. She denies tobacco, alcohol, or illicit drug use. She has no report family hx of CAD in her parents or sister.   In the ED, BNP level was drawn which was elevated at 1139.4.  Her troponin was elevated at 0.13.  CXR was performed which revealed retrocardiac left base collapse/consolidation with  possible volume loss in the left hemithorax with small left pleural effusion. CT scan of the brain showed a rounded region of low attenuation in the right cerebellar hemisphere with mild regional mass effect but no associated bleed and was found to be most consistent with an infarct, acute to subacute. Neurology was consulted and recommended completion of stroke work-up.   Of note, Stephanie Simmons was last seen by Dr. [name] on 12/17/2017 for chronic atrial fibrillation and ILR insertion (device now out secondary to bleeding) follow-up.  At that time, the patients sister reported that she had been staying in bed for longer periods of time and was noted to be more fatigued. Her atrial fibrillation was noted to be well controlled. Her BP was under control as well. Given that it had been several years since an echocardiogram was completed, orders were placed for further evaluation. Normal LVEF and wall motion. PA pressures were noted to be 27mmHg and she was referred to CHF clinic with Dr. Aundra Dubin for pulmonary HTN.   On 12/30/17, pt sister called on-call answering service stating that the patient had not been eating well for the last several days with associated vomiting and more shortness of breath.  Recommendations were made by on-call APP for her to report to the ED for further evaluation given her symptoms.  She is currently pending evaluation with Dr. Loralie Champagne in the CHF clinic in approximately 2 weeks for pulmonary hypertension.  Past Medical History:  Diagnosis Date  . Chest pain    a. 04/2009 Cath: essentially nl cors, EF 65%, mod LVH.  Marland Kitchen GERD (gastroesophageal reflux disease)   . Hyperlipidemia   . Hypertension   .  Hypothyroidism    does not take medication for this any longer per patient   . LVH (left ventricular hypertrophy)    a. 04/2009 Echo: EF 55-65%, mild conc LVH, no reg wma, Gr 1 DD, mild MR.  . Monocytosis 04/21/2014    Past Surgical History:  Procedure Laterality Date  .  ABDOMINAL HYSTERECTOMY    . LOOP RECORDER INSERTION N/A 10/16/2017   Procedure: LOOP RECORDER INSERTION;  Surgeon: Evans Lance, MD;  Location: [facility];  Service: Cardiovascular;  Laterality: N/A;  . MASS EXCISION     removal of mass- pleural      Prior to Admission medications   Medication Sig Start Date End Date Taking? Authorizing Provider  acetaminophen (TYLENOL) 500 MG tablet Take 500 mg by mouth every 6 (six) hours as needed (pain).   Yes [provider]  atorvastatin (LIPITOR) 10 MG tablet Take 1 tablet (10 mg total) by mouth every evening. 07/03/17  Yes Evans Lance, MD  bimatoprost (LUMIGAN) 0.01 % SOLN Place 1 drop into both eyes at bedtime.   Yes [provider]  chlorhexidine (PERIDEX) 0.12 % solution Use as directed 15 mLs in the mouth or throat 2 (two) times daily.   Yes [provider]  Cholecalciferol (VITAMIN D) 2000 units tablet Take 2,000 Units by mouth daily.   Yes [provider]  dextromethorphan (DELSYM) 30 MG/5ML liquid Take 60 mg by mouth as needed for cough.   Yes [provider]  diltiazem (CARDIZEM CD) 240 MG 24 hr capsule TAKE 1 CAPSULE (240 MG TOTAL) BY MOUTH DAILY. 10/01/17  Yes Evans Lance, MD  dorzolamide-timolol (COSOPT) 22.3-6.8 MG/ML ophthalmic solution Place 1 drop into both eyes 2 (two) times daily.  08/28/17  Yes [provider]  ELIQUIS 5 MG TABS tablet TAKE 1 TABLET BY MOUTH TWICE A DAY 12/25/17  Yes Evans Lance, MD  enalapril (VASOTEC) 10 MG tablet Take 1 1/2 tablets by mouth daily Patient taking differently: Take 10 mg by mouth daily.  10/01/17  Yes Evans Lance, MD  Esomeprazole Magnesium (NEXIUM 24HR) 20 MG TBEC Take 20 mg by mouth daily.    Yes [provider]  FLUoxetine (PROZAC) 20 MG capsule Take 20 mg by mouth daily. 12/26/17  Yes [provider]  furosemide (LASIX) 40 MG tablet TAKE 1 TABLET BY MOUTH EVERY DAY 11/13/17  Yes Evans Lance, MD  KLOR-CON M20  20 MEQ tablet TAKE 2 TABLETS (40 MEQ TOTAL) BY MOUTH 2 (TWO) TIMES DAILY. 10/01/17  Yes Evans Lance, MD  metoprolol succinate (TOPROL-XL) 25 MG 24 hr tablet TAKE 1 TABLET (25 MG TOTAL) BY MOUTH DAILY. TAKE WITH OR IMMEDIATELY FOLLOWING A MEAL. 10/01/17  Yes Evans Lance, MD  Polyethyl Glycol-Propyl Glycol (SYSTANE) 0.4-0.3 % SOLN Place 1 drop into both eyes as needed (dry eyes).    Yes [provider]  Sodium Chloride, Hypertonic, (MURO 128 OP) Place 1 drop into both eyes 3 (three) times daily.    Yes [provider]    Inpatient Medications: Scheduled Meds: . apixaban  5 mg Oral BID  . atorvastatin  80 mg Oral QPM  . chlorhexidine  15 mL Mouth/Throat BID  . diltiazem  240 mg Oral Daily  . dorzolamide-timolol  1 drop Both Eyes BID  . enalapril  10 mg Oral Daily  . FLUoxetine  20 mg Oral Daily  . latanoprost  1 drop Both Eyes QHS  . metoprolol succinate  25 mg Oral Daily  . potassium chloride SA  40 mEq Oral BID  . sodium chloride  1 drop Both Eyes TID   Continuous Infusions:  PRN Meds: acetaminophen **OR** acetaminophen (TYLENOL) oral liquid 160 mg/5 mL **OR** acetaminophen, polyvinyl alcohol, senna-docusate  Allergies:   No Known Allergies  Social History:   Social History   Socioeconomic History  . Marital status: Single    Spouse name: Not on file  . Number of children: Not on file  . Years of education: Not on file  . Highest education level: Not on file  Occupational History  . Not on file  Social Needs  . Financial resource strain: Not on file  . Food insecurity:    Worry: Not on file    Inability: Not on file  . Transportation needs:    Medical: Not on file    Non-medical: Not on file  Tobacco Use  . Smoking status: Former Smoker    Types: Cigarettes    Last attempt to quit: 06/26/1992    Years since quitting: 25.5  . Smokeless tobacco: Never Used  Substance and Sexual Activity  . Alcohol use: No  . Drug use: No  . Sexual activity: Not  on file  Lifestyle  . Physical activity:    Days per week: Not on file    Minutes per session: Not on file  . Stress: Not on file  Relationships  . Social connections:    Talks on phone: Not on file    Gets together: Not on file    Attends religious service: Not on file    Active member of club or organization: Not on file    Attends meetings of clubs or organizations: Not on file    Relationship status: Not on file  . Intimate partner violence:    Fear of current or ex partner: Not on file    Emotionally abused: Not on file    Physically abused: Not on file    Forced sexual activity: Not on file  Other Topics Concern  . Not on file  Social History Narrative   Lives in [place] by herself.    Family History:   Family History  Problem Relation Age of Onset  . Other Mother        s/p PPM, died @ 73  . Heart attack Father        died @ 61.  . Other Father        enlarged heart   . Stroke Sister    Family Status:  Family Status  Relation Name Status  . Mother  Deceased  . Father  Deceased  . Sister  Alive  . Sister  Deceased    ROS:  Please see the history of present illness.  All other ROS reviewed and negative.     Physical Exam/Data:   Vitals:   01/02/18 0030 01/02/18 0223 01/02/18 0353 01/02/18 0730  BP: 120/77 122/77 124/75 (P) 134/85  Pulse: (!) 51 (!) 51 (!) 51 (!) (P) 54  Resp:   18 (P) 18  Temp: 97.6 F (36.4 C) 97.9 F (36.6 C) 98 F (36.7 C) (P) 97.8 F (36.6 C)  TempSrc: Oral Oral Oral (P) Oral  SpO2: 93% 94% 97% (P) 90%  Weight:   114 lb 6.7 oz (51.9 kg)   Height:       No intake or output data in the 24 hours ending 01/02/18 1014 Filed Weights   01/01/18 1356 01/02/18 0353  Weight: 118 lb (53.5 kg) 114 lb 6.7 oz (51.9 kg)   Body mass index is 20.93 kg/m.   General: Frail, older than stated age, NAD Skin: Warm, dry, intact  Head: Normocephalic, atraumatic, clear, moist mucus membranes. Neck: Negative for carotid bruits. No  JVD Lungs:Diminished bilaterally in all fields. No wheezes. Mild rales. Breathing is unlabored. Cardiovascular: Irregularly irregular with S1 S2. No murmurs, rubs or gallops Abdomen: Soft, non-tender, non-distended with normoactive bowel sounds. No obvious abdominal masses. MSK: Strength and tone appear normal for age. 5/5 in all extremities Extremities: No edema. No clubbing or cyanosis. DP/PT pulses 2+ bilaterally Neuro: Alert and oriented to person, place and situation at times. No focal deficits. No facial asymmetry. MAE spontaneously. Psych: Responds to questions somewhat appropriately with normal affect.     EKG:  The EKG was personally reviewed and demonstrates: 01/01/2018 atrial fibrillation HR 78 with nonspecific T wave inversions Telemetry:  Telemetry was personally reviewed and demonstrates: 01/02/18 atrial fibrillation   Relevant CV Studies:  ECHO: 12/20/2017: Study Conclusions  - Left ventricle: LVEF is vigorous with midcavity obliteration   during systole The cavity size was normal. Wall thickness was   increased in a pattern of moderate LVH. Systolic function was   vigorous. The estimated ejection fraction was in the range of 65%   to 70%. - Aortic valve: There was mild regurgitation. - Mitral valve: Calcified annulus. Mildly thickened leaflets .   There was mild regurgitation. - Left atrium: The atrium was severely dilated. - Right atrium: The atrium was moderately to severely dilated. - Pulmonary arteries: PA peak pressure: 93 mm Hg (S). - Pericardium, extracardiac: A trivial pericardial effusion was   identified.  CATH: None  Laboratory Data:  Chemistry Recent Labs  Lab 01/01/18 1440 01/02/18 0551  NA 136 137  K 4.9 3.6  CL 104 103  CO2 21* 23  GLUCOSE 85 101*  BUN 16 12  CREATININE 0.86 0.76  CALCIUM 9.2 9.0  GFRNONAA >60 >60  GFRAA >60 >60  ANIONGAP 11 11    Total Protein  Date Value Ref Range Status  01/01/2018 5.6 (L) 6.5 - 8.1 g/dL Final    Albumin  Date Value Ref Range Status  01/01/2018 3.3 (L) 3.5 - 5.0 g/dL Final   AST  Date Value Ref Range Status  01/01/2018 29 15 - 41 U/L Final   ALT  Date Value Ref Range Status  01/01/2018 27 0 - 44 U/L Final    Comment:    Please note change in reference range.   Alkaline Phosphatase  Date Value Ref Range Status  01/01/2018 43 38 - 126 U/L Final   Total Bilirubin  Date Value Ref Range Status  01/01/2018 1.4 (H) 0.3 - 1.2 mg/dL Final   Hematology Recent Labs  Lab 01/01/18 1440 01/02/18 0551  WBC 7.5 7.5  RBC 5.54* 5.42*  HGB 13.8 13.4  HCT 45.2 44.3  MCV 81.6 81.7  MCH 24.9* 24.7*  MCHC 30.5 30.2  RDW 18.2* 17.9*  PLT 245 252   Cardiac Enzymes Recent Labs  Lab 01/01/18 1440 01/01/18 1824 01/02/18 0551  TROPONINI 0.13* 0.11* 0.10*   No results for input(s): TROPIPOC in the last 168 hours.  BNP Recent Labs  Lab 01/01/18 1440  BNP 1,139.4*    DDimer No results for input(s): DDIMER in the last 168 hours. TSH:  Lab Results  Component Value Date   TSH 1.980 01/01/2018   Lipids: Lab Results  Component Value Date  CHOL 145 01/02/2018   HDL 39 (L) 01/02/2018   LDLCALC 94 01/02/2018   TRIG 59 01/02/2018   CHOLHDL 3.7 01/02/2018   HgbA1c: Lab Results  Component Value Date   HGBA1C 6.2 (H) 09/25/2013    Radiology/Studies:  Ct Angio Head W Or Wo Contrast  Result Date: 01/01/2018 CLINICAL DATA:  Follow-up cerebellar infarct. History of hypertension, hyperlipidemia. EXAM: CT ANGIOGRAPHY HEAD AND NECK TECHNIQUE: Multidetector CT imaging of the head and neck was performed using the standard protocol during bolus administration of intravenous contrast. Multiplanar CT image reconstructions and MIPs were obtained to evaluate the vascular anatomy. Carotid stenosis measurements (when applicable) are obtained utilizing NASCET criteria, using the distal internal carotid diameter as the denominator. CONTRAST:  68mL ISOVUE-370 IOPAMIDOL (ISOVUE-370) INJECTION  76% COMPARISON:  MRI and CT HEAD January 01, 2018. FINDINGS: CTA NECK FINDINGS: AORTIC ARCH: Common origin of the innominate and LEFT common carotid artery. LEFT vertebral artery arises directly from the aortic arch. Arch vessel origins are patent. Mild calcific atherosclerosis aortic arch. The origins of the innominate, left Common carotid artery and subclavian artery are widely patent. RIGHT CAROTID SYSTEM: Common carotid artery is patent. Normal appearance of the carotid bifurcation without hemodynamically significant stenosis by NASCET criteria. Normal appearance of the internal carotid artery. LEFT CAROTID SYSTEM: Common carotid artery is patent. Normal appearance of the carotid bifurcation without hemodynamically significant stenosis by NASCET criteria. Normal appearance of the internal carotid artery. VERTEBRAL ARTERIES:RIGHT vertebral artery is dominant. Normal appearance of the vertebral arteries, widely patent. SKELETON: No acute osseous process though bone windows have not been submitted. OTHER NECK: Soft tissues of the neck are nonacute though, not tailored for evaluation. Thyromegaly with scattered calcifications, no dominant nodule. Hypoplastic mandible with hardware. UPPER CHEST: Severe centrilobular and paraseptal emphysema. LEFT upper chest wall deformity. LEFT calcified pleural plaque. CTA HEAD FINDINGS: ANTERIOR CIRCULATION: Patent cervical internal carotid arteries, petrous, cavernous and supra clinoid internal carotid arteries. Moderate stenosis RIGHT supraclinoid ICA. Patent anterior communicating artery. Patent anterior and middle cerebral arteries, mild luminal irregularity compatible with atherosclerosis. Long segment moderate stenosis RIGHT M1 segment. No large vessel occlusion, significant stenosis, contrast extravasation or aneurysm. POSTERIOR CIRCULATION: Patent vertebral arteries, vertebrobasilar junction and basilar artery, as well as main branch vessels. Patent RIGHT PICA. Patent  posterior cerebral arteries. Robust LEFT posterior communicating artery. No large vessel occlusion, significant stenosis, contrast extravasation or aneurysm. VENOUS SINUSES: Major dural venous sinuses are patent though not tailored for evaluation on this angiographic examination. ANATOMIC VARIANTS: Hypoplastic LEFT P1 segment. DELAYED PHASE: Not performed. MIP images reviewed. IMPRESSION: CTA NECK: 1. No hemodynamically significant stenosis ICA. Patent vertebral arteries. CTA HEAD: 1. No emergent large vessel occlusion or flow-limiting stenosis. 2. Moderate stenosis RIGHT supraclinoid ICA and RIGHT M1 segment. Emphysema (ICD10-J43.9).  Aortic Atherosclerosis (ICD10-I70.0). Electronically Signed   By: Elon Alas M.D.   On: 01/01/2018 22:57   Dg Chest 2 View  Result Date: 01/01/2018 CLINICAL DATA:  Chest pain EXAM: CHEST - 2 VIEW COMPARISON:  07/27/2016 FINDINGS: AP and lateral views of the chest show a hyperexpansion. The cardio pericardial silhouette is enlarged. Left base collapse/consolidation is associated with small left pleural effusion. May be some volume loss in the left hemithorax is well. Staple line again noted at the left apex. Bones are diffusely demineralized. Thoracolumbar scoliosis again noted. Telemetry leads overlie the chest. IMPRESSION: Retrocardiac left base collapse/consolidation with possible volume loss in the left hemithorax. Tiny left pleural effusion. Electronically Signed   By: Misty Stanley  M.D.   On: 01/01/2018 15:06   Ct Head Wo Contrast  Result Date: 01/01/2018 CLINICAL DATA:  Generalized weakness for 3 days. Recent diagnosis of atrial fibrillation. EXAM: CT HEAD WITHOUT CONTRAST TECHNIQUE: Contiguous axial images were obtained from the base of the skull through the vertex without intravenous contrast. COMPARISON:  None. FINDINGS: Brain: No subdural, epidural, or subarachnoid hemorrhage. There is a rounded region of low attenuation in the right cerebellar hemisphere with  mild regional mass effect. No associated hemorrhage. The remainder of the cerebellum is normal. The brainstem and basal cisterns are normal. Ventricles and sulci are unremarkable. No acute cortical ischemia or infarct otherwise seen. No midline shift. Vascular: Calcified atherosclerosis in the intracranial carotids. Skull: Normal. Negative for fracture or focal lesion. Sinuses/Orbits: No acute finding. Other: None. IMPRESSION: 1. Rounded region of low attenuation in the right cerebellar hemisphere with mild regional mass effect but no associated bleed is most consistent with an infarct, acute to subacute. 2. No other acute abnormalities. Findings called to Dr. Alvino Chapel. Electronically Signed   By: Dorise Bullion III M.D   On: 01/01/2018 15:31   Ct Angio Neck W Or Wo Contrast  Result Date: 01/01/2018 CLINICAL DATA:  Follow-up cerebellar infarct. History of hypertension, hyperlipidemia. EXAM: CT ANGIOGRAPHY HEAD AND NECK TECHNIQUE: Multidetector CT imaging of the head and neck was performed using the standard protocol during bolus administration of intravenous contrast. Multiplanar CT image reconstructions and MIPs were obtained to evaluate the vascular anatomy. Carotid stenosis measurements (when applicable) are obtained utilizing NASCET criteria, using the distal internal carotid diameter as the denominator. CONTRAST:  36mL ISOVUE-370 IOPAMIDOL (ISOVUE-370) INJECTION 76% COMPARISON:  MRI and CT HEAD January 01, 2018. FINDINGS: CTA NECK FINDINGS: AORTIC ARCH: Common origin of the innominate and LEFT common carotid artery. LEFT vertebral artery arises directly from the aortic arch. Arch vessel origins are patent. Mild calcific atherosclerosis aortic arch. The origins of the innominate, left Common carotid artery and subclavian artery are widely patent. RIGHT CAROTID SYSTEM: Common carotid artery is patent. Normal appearance of the carotid bifurcation without hemodynamically significant stenosis by NASCET criteria.  Normal appearance of the internal carotid artery. LEFT CAROTID SYSTEM: Common carotid artery is patent. Normal appearance of the carotid bifurcation without hemodynamically significant stenosis by NASCET criteria. Normal appearance of the internal carotid artery. VERTEBRAL ARTERIES:RIGHT vertebral artery is dominant. Normal appearance of the vertebral arteries, widely patent. SKELETON: No acute osseous process though bone windows have not been submitted. OTHER NECK: Soft tissues of the neck are nonacute though, not tailored for evaluation. Thyromegaly with scattered calcifications, no dominant nodule. Hypoplastic mandible with hardware. UPPER CHEST: Severe centrilobular and paraseptal emphysema. LEFT upper chest wall deformity. LEFT calcified pleural plaque. CTA HEAD FINDINGS: ANTERIOR CIRCULATION: Patent cervical internal carotid arteries, petrous, cavernous and supra clinoid internal carotid arteries. Moderate stenosis RIGHT supraclinoid ICA. Patent anterior communicating artery. Patent anterior and middle cerebral arteries, mild luminal irregularity compatible with atherosclerosis. Long segment moderate stenosis RIGHT M1 segment. No large vessel occlusion, significant stenosis, contrast extravasation or aneurysm. POSTERIOR CIRCULATION: Patent vertebral arteries, vertebrobasilar junction and basilar artery, as well as main branch vessels. Patent RIGHT PICA. Patent posterior cerebral arteries. Robust LEFT posterior communicating artery. No large vessel occlusion, significant stenosis, contrast extravasation or aneurysm. VENOUS SINUSES: Major dural venous sinuses are patent though not tailored for evaluation on this angiographic examination. ANATOMIC VARIANTS: Hypoplastic LEFT P1 segment. DELAYED PHASE: Not performed. MIP images reviewed. IMPRESSION: CTA NECK: 1. No hemodynamically significant stenosis ICA. Patent vertebral arteries.  CTA HEAD: 1. No emergent large vessel occlusion or flow-limiting stenosis. 2.  Moderate stenosis RIGHT supraclinoid ICA and RIGHT M1 segment. Emphysema (ICD10-J43.9).  Aortic Atherosclerosis (ICD10-I70.0). Electronically Signed   By: Elon Alas M.D.   On: 01/01/2018 22:57   Mr Brain Wo Contrast  Result Date: 01/01/2018 CLINICAL DATA:  Generalized weakness, LEFT facial droop for a few days. Follow up stroke. History of hypertension, hyperlipidemia. EXAM: MRI HEAD WITHOUT CONTRAST TECHNIQUE: Multiplanar, multiecho pulse sequences of the brain and surrounding structures were obtained without intravenous contrast. COMPARISON:  CT HEAD January 01, 2018 FINDINGS: INTRACRANIAL CONTENTS: Wedge-like reduced diffusion RIGHT inferior cerebellum with low to normalized ADC values and RIGHT FLAIR signal. A few punctate foci reduced diffusion RIGHT mid cerebellum. Regional mass effect without midline shift. Old small bilateral cerebellar infarcts. Chronic microhemorrhage LEFT occipital lobe without susceptibility artifact to suggest acute blood products. Old LEFT basal ganglia lacunar infarct. Prominent basal ganglia and thalami perivascular spaces associated chronic small vessel ischemic changes. Patchy supratentorial and pontine white matter FLAIR T2 hyperintensities compatible with chronic small vessel ischemic changes, normal for age. No parenchymal brain volume loss for age. No hydrocephalus. No abnormal extra-axial fluid collections. VASCULAR: Normal major intracranial vascular flow voids present at skull base. SKULL AND UPPER CERVICAL SPINE: No abnormal sellar expansion. No suspicious calvarial bone marrow signal. Craniocervical junction maintained. SINUSES/ORBITS: The mastoid air-cells and included paranasal sinuses are well-aerated.The included ocular globes and orbital contents are non-suspicious. Status post bilateral ocular lens implants. OTHER: None. IMPRESSION: 1. Acute nonhemorrhagic RIGHT cerebellar/PICA territory infarct. 2. Old small bilateral cerebellar and LEFT basal ganglia  infarcts. Mild chronic small vessel ischemic changes. Electronically Signed   By: Elon Alas M.D.   On: 01/01/2018 22:13    Assessment and Plan:   1.  Acute on chronic diastolic congestive heart failure with recent dx of PHTN: -Patient presented to [facility] on 01/01/2018 with progressive weakness over the last several weeks with acute functional decline. She was last seen by Dr. [name] 12/17/17 for AF follow up (and falied ILR implant which was removed secondary to site bleeding). She has complaints of weakness and dyspnea. An echocardiogram was ordered secondary to her symptoms which showed normal LV function but elevated PA pressure. She was then referred to Dr. Aundra Dubin with the CHF clinic (scheduled for 07/18) for further workup on this.  -BNP on admission was elevated at 1139.  -CXR showed cardiomegaly with left-sided consolidation and small left pleural effusion in which  IV Lasix 40 mg x 1 dose was given -Weight,114lb today, 118lb on admission 01/01/2018 -I&O, no output recorded.  Will need to follow strict I&O's -Creatinine, stable at 0.76 -Echocardiogram 12/19/17 with EF 65-70%, no wall motion abnormalities and elevated PA peak pressure at 93 mmHg>>No source of embolus  -On home dose Metoprolol 25, Enalapril 10, atorvastatin 80 -Home Lasix 40mg  Po not restarted on admission>>>will give Lasix IV 40mg  daily for now and monitor OU closely  -Will obtain VQ scan to rule out PE  2.  Chest pain with elevated troponin: -Pt presented with 2 week hx of progressive fatigue and SOB. Was last seen in the office for follow up of AF without c/o CP. Today she tells me that she has had intermittent chest pain for the last several months, although he history is extremely unclear and hard to follow symptom description. She did have weakness and fatigue at that time of her appointment therefore, an echocardiogram was performed which showed normal LV function and WM, however  she had a significant progression of  pulmonary HTN (PA pressure in 08/2014 36 mmHG>>>now 93 mmHg on 12/20/17). She has now been referred to Dr. Aundra Dubin for PHN workup.  -Troponin, 0.13>0.11>0.10. Unclear as to if this is elevated in the setting of acute CHF or chest pain complaints -EKG without acute ischemic ST-T wave changes, consistent with prior tracings -Patient given ASA x1 -In the setting of acute CVA, patient currently not candidate for invasive ischemic work-up>>>could consider stress test evaluation. If moderate or high risk, could consider further invasive evaluation.  -On home dose Metoprolol 25, Enalapril 10, atorvastatin 80  3.  Acute CVA: -Patient presented to [facility] with 2-week progression of fatigue with functional decline. Head CT revealed acute/subacute infarct in the right cerebellar hemisphere in which neurology was consulted -Echocardiogram 12/20/2017 EF 65-70%, NMWA and no source of embolus  -Neurochecks, PT/OT per primary team, neurology -ASA 325mg  given once   4.  Chronic atrial fibrillation: -Long history of atrial fibrillation s/p ILR which was ultimately removed secondary to site bleeding, followed by Dr. [name]. -Continue diltiazem 240, Eliquis 5 mg -CHA2DS2VASc =7 (age, sex, CHF, HTN, stroke)  5.  Hypertension: -Stable, 124/75, 122/77, 120/77, 126/70 -Continue metoprolol and enalapril  6.  Hyperlipidemia: -Risk modification, atorvastatin increased to 80 mg daily   For questions or updates, please contact [place] Please consult [email or web address] for contact info under Cardiology/STEMI.   SignedKathyrn Drown NP-C [facility] Pager: [phone] 01/02/2018 10:14 AM

## 2018-01-02 NOTE — Telephone Encounter (Signed)
Per review of Pt chart, Pt admitted to [facility] yesterday 01/01/2018 with weakness d/t possible stroke. No further action needed at this time.

## 2018-01-02 NOTE — Evaluation (Signed)
Physical Therapy Evaluation Patient Details Name: Stephanie Simmons MRN: [id number] DOB: 28-Jan-1941 Today's Date: 01/02/2018   History of Present Illness  Pt is a 77 y/o female admitted with history of HTN, HLD, LVHP, AF on eliquis, hypothyroidism and loop placed for syncope presenting with generalized weakness x 2 weeks and L facial weakness.  MRI + R cerebellar / PICA infarct    Clinical Impression  Pt presented supine in bed with HOB elevated, awake and willing to participate in therapy session. Prior to admission, pt reported that she was independent with all functional mobility and ADLs. Pt lives alone and has family that could assist PRN. Pt currently requires mod A for bed mobility, min A for transfers and min guard for short distance ambulation within her room with RW. Pt would continue to benefit from skilled physical therapy services at this time while admitted and after d/c to address the below listed limitations in order to improve overall safety and independence with functional mobility.     Follow Up Recommendations SNF    Equipment Recommendations  None recommended by PT    Recommendations for Other Services       Precautions / Restrictions Precautions Precautions: Fall Restrictions Weight Bearing Restrictions: No      Mobility  Bed Mobility Overal bed mobility: Needs Assistance Bed Mobility: Supine to Sit;Sit to Supine     Supine to sit: Mod assist Sit to supine: Min guard   General bed mobility comments: increased time and effort, assist for trunk elevation  Transfers Overall transfer level: Needs assistance Equipment used: Rolling walker (2 wheeled) Transfers: Sit to/from Stand Sit to Stand: Min assist         General transfer comment: increased time and effort, min A to power into standing from EOB x1 and from toilet x1  Ambulation/Gait Ambulation/Gait assistance: Min guard Gait Distance (Feet): 20 Feet(20' x2) Assistive device: Rolling walker  (2 wheeled) Gait Pattern/deviations: Step-through pattern;Decreased step length - right;Decreased step length - left;Decreased stride length;Shuffle Gait velocity: decreased Gait velocity interpretation: <1.31 ft/sec, indicative of household ambulator General Gait Details: pt with modest instability with use of RW, cueing for safety and close min guard  Science writer    Modified Rankin (Stroke Patients Only) Modified Rankin (Stroke Patients Only) Pre-Morbid Rankin Score: No symptoms Modified Rankin: Moderate disability     Balance Overall balance assessment: Needs assistance Sitting-balance support: Feet supported Sitting balance-Leahy Scale: Fair     Standing balance support: Bilateral upper extremity supported;During functional activity Standing balance-Leahy Scale: Poor                               Pertinent Vitals/Pain Pain Assessment: No/denies pain    Home Living Family/patient expects to be discharged to:: Private residence Living Arrangements: Alone   Type of Home: House Home Access: Elevator     Home Layout: One level Home Equipment: None Additional Comments: sister and senior ride helps with transportation    Prior Function Level of Independence: Independent               Hand Dominance   Dominant Hand: Right    Extremity/Trunk Assessment   Upper Extremity Assessment Upper Extremity Assessment: Generalized weakness    Lower Extremity Assessment Lower Extremity Assessment: Generalized weakness    Cervical / Trunk Assessment Cervical / Trunk Assessment: Kyphotic  Communication   Communication: HOH  Cognition Arousal/Alertness: Awake/alert Behavior During Therapy: WFL for tasks assessed/performed Overall Cognitive Status: Within Functional Limits for tasks assessed                                        General Comments      Exercises     Assessment/Plan    PT  Assessment Patient needs continued PT services  PT Problem List Decreased strength;Decreased range of motion;Decreased activity tolerance;Decreased balance;Decreased mobility;Decreased coordination;Decreased knowledge of use of DME;Decreased safety awareness;Decreased knowledge of precautions       PT Treatment Interventions DME instruction;Functional mobility training;Gait training;Stair training;Therapeutic activities;Therapeutic exercise;Balance training;Neuromuscular re-education;Patient/family education    PT Goals (Current goals can be found in the Care Plan section)  Acute Rehab PT Goals Patient Stated Goal: "I need to go to the bathroom" PT Goal Formulation: With patient Time For Goal Achievement: 01/16/18 Potential to Achieve Goals: Good    Frequency Min 4X/week   Barriers to discharge        Co-evaluation               AM-PAC PT "6 Clicks" Daily Activity  Outcome Measure Difficulty turning over in bed (including adjusting bedclothes, sheets and blankets)?: Unable Difficulty moving from lying on back to sitting on the side of the bed? : Unable Difficulty sitting down on and standing up from a chair with arms (e.g., wheelchair, bedside commode, etc,.)?: Unable Help needed moving to and from a bed to chair (including a wheelchair)?: A Little Help needed walking in hospital room?: A Little Help needed climbing 3-5 steps with a railing? : A Lot 6 Click Score: 11    End of Session Equipment Utilized During Treatment: Gait belt Activity Tolerance: Patient tolerated treatment well Patient left: in bed;with call bell/phone within reach;with bed alarm set Nurse Communication: Mobility status PT Visit Diagnosis: Other abnormalities of gait and mobility (R26.89);Muscle weakness (generalized) (M62.81)    Time: 9449-6759 PT Time Calculation (min) (ACUTE ONLY): 13 min   Charges:   PT Evaluation $PT Eval Moderate Complexity: 1 Mod     PT G Codes:        [place], PT, DPT [place] 01/02/2018, 4:11 PM

## 2018-01-02 NOTE — Progress Notes (Signed)
PROGRESS NOTE  Stephanie Simmons HDQ:[id number] DOB: 08-23-1940 DOA: 01/01/2018 PCP: Leighton Ruff, MD   LOS: 1 day   Brief Narrative / Interim history: 77 year old female here for hypertension, hyperlipidemia, pulmonary hypertension, chronic A. fib on Eliquis who presents with complaints of weakness on now for the past few days.  She is at the point where she needs assistance to ambulate.  MRI on admission was positive for cerebellar stroke and she was admitted to the hospital for CVA work-up.  Neurology was consulted.  Cardiology was also consulted due to reports of chest pains on admission and positive troponin.  Assessment & Plan: Principal Problem:   CVA (cerebral vascular accident) ([employer]) Active Problems:   Atrial fibrillation ([place])   Acute on chronic diastolic CHF (congestive heart failure), NYHA class 3 (HCC)   Hyperlipidemia   Elevated troponin   Acute CVA -Showed acute nonhemorrhagic right cerebellar/PICA territory infarct as well as old small bilateral cerebellar and left basal ganglia infarcts. -Neurology following, discussed with Dr. Leonie Man -Patient has A. fib which places her at risk for strokes, and has missed at least a few days of Eliquis -She underwent a 2D echo just 2 weeks ago, will not repeat.  At that time her EF was 65 to 70%, and moderate LVH.  PA pressure was elevated to 93 -CT angiogram was done on 7/9 without hemodynamically significant stenosis -LDL is 94, she is on statin -Continue Eliquis  Persistent A. fib/acute on chronic diastolic CHF/severe pulmonary hypertension /chest pain on admission / HTN -Plan to be seen in CHF clinic, cardiology consulted currently and following in the hospital.  Appreciate input.  Troponin leak likely due to demand ischemia rather than acute MI. -She is to undergo a VQ scan, also check LFTs, HIV, ANA, RF, and ANCA.  She will need PFTs as an outpatient. -She received Lasix x1 on admission, defer further dosing to cardiology.   Chest x-ray on admission with cardiomegaly  Bradycardia -Decreased dose of Cardizem  History of hypothyroidism -No longer taking medication, TSH pending  Hyperlipidemia -Continue statin, increase to 80    DVT prophylaxis: Eliquis Code Status: Full code Family Communication: d/w sister at bedside Disposition Plan: TBD  Consultants:   Neurology   Cardiology   Procedures:   2D echo:  Study Conclusions - Left ventricle: LVEF is vigorous with midcavity obliteration during systole The cavity size was normal. Wall thickness was increased in a pattern of moderate LVH. Systolic function was vigorous. The estimated ejection fraction was in the range of 65% to 70%. - Aortic valve: There was mild regurgitation. - Mitral valve: Calcified annulus. Mildly thickened leaflets . There was mild regurgitation. - Left atrium: The atrium was severely dilated. - Right atrium: The atrium was moderately to severely dilated. - Pulmonary arteries: PA peak pressure: 93 mm Hg (S). - Pericardium, extracardiac: A trivial pericardial effusion was identified.  Antimicrobials:  None    Subjective: - no chest pain, shortness of breath, no abdominal pain, nausea or vomiting.   Objective: Vitals:   01/02/18 0223 01/02/18 0353 01/02/18 0730 01/02/18 1206  BP: 122/77 124/75 134/85 125/81  Pulse: (!) 51 (!) 51 (!) 54 67  Resp:  18 18 18   Temp: 97.9 F (36.6 C) 98 F (36.7 C) 97.8 F (36.6 C) 98.1 F (36.7 C)  TempSrc: Oral Oral Oral Oral  SpO2: 94% 97% 90% 92%  Weight:  51.9 kg (114 lb 6.7 oz)    Height:       No intake or  output data in the 24 hours ending 01/02/18 1449 Filed Weights   01/01/18 1356 01/02/18 0353  Weight: 53.5 kg (118 lb) 51.9 kg (114 lb 6.7 oz)    Examination:  Constitutional: NAD Eyes: lids and conjunctivae normal ENMT: Mucous membranes are moist. Respiratory: clear to auscultation bilaterally, no wheezing, no crackles. Normal respiratory effort.  Cardiovascular:  irregular, no murmurs / rubs / gallops. No LE edema. 2+ pedal pulses. No carotid bruits.  Abdomen: no tenderness. Bowel sounds positive.  Musculoskeletal: no clubbing / cyanosis Neurologic: equal strength, CN 2-12 grossly intact.  Psychiatric: Normal judgment and insight. Alert and oriented x 3. Normal mood.    Data Reviewed: I have independently reviewed following labs and imaging studies  MRI - right cerebellar acute stroke  CBC: Recent Labs  Lab 01/01/18 1440 01/02/18 0551  WBC 7.5 7.5  NEUTROABS  --  4.7  HGB 13.8 13.4  HCT 45.2 44.3  MCV 81.6 81.7  PLT 245 195   Basic Metabolic Panel: Recent Labs  Lab 01/01/18 1440 01/02/18 0551  NA 136 137  K 4.9 3.6  CL 104 103  CO2 21* 23  GLUCOSE 85 101*  BUN 16 12  CREATININE 0.86 0.76  CALCIUM 9.2 9.0  MG  --  1.7   GFR: Estimated Creatinine Clearance: 46.6 mL/min (by C-G formula based on SCr of 0.76 mg/dL). Liver Function Tests: Recent Labs  Lab 01/01/18 1440  AST 29  ALT 27  ALKPHOS 43  BILITOT 1.4*  PROT 5.6*  ALBUMIN 3.3*   No results for input(s): LIPASE, AMYLASE in the last 168 hours. No results for input(s): AMMONIA in the last 168 hours. Coagulation Profile: Recent Labs  Lab 01/01/18 1440  INR 1.55   Cardiac Enzymes: Recent Labs  Lab 01/01/18 1440 01/01/18 1824 01/02/18 0551  TROPONINI 0.13* 0.11* 0.10*   BNP (last 3 results) No results for input(s): PROBNP in the last 8760 hours. HbA1C: Recent Labs    01/02/18 0551  HGBA1C 6.2*   CBG: No results for input(s): GLUCAP in the last 168 hours. Lipid Profile: Recent Labs    01/02/18 0551  CHOL 145  HDL 39*  LDLCALC 94  TRIG 59  CHOLHDL 3.7   Thyroid Function Tests: Recent Labs    01/01/18 1824  TSH 1.980   Anemia Panel: No results for input(s): VITAMINB12, FOLATE, FERRITIN, TIBC, IRON, RETICCTPCT in the last 72 hours. Urine analysis:    Component Value Date/Time   COLORURINE YELLOW 04/26/2009 1213   APPEARANCEUR CLEAR  04/26/2009 1213   LABSPEC 1.012 04/26/2009 1213   PHURINE 7.0 04/26/2009 1213   GLUCOSEU NEGATIVE 04/26/2009 1213   HGBUR MODERATE (A) 04/26/2009 1213   BILIRUBINUR NEGATIVE 04/26/2009 1213   Cash 04/26/2009 1213   PROTEINUR NEGATIVE 04/26/2009 1213   UROBILINOGEN 0.2 04/26/2009 1213   NITRITE NEGATIVE 04/26/2009 1213   LEUKOCYTESUR NEGATIVE 04/26/2009 1213   Sepsis Labs: Invalid input(s): PROCALCITONIN, LACTICIDVEN  No results found for this or any previous visit (from the past 240 hour(s)).    Radiology Studies: Ct Angio Head W Or Wo Contrast  Result Date: 01/01/2018 CLINICAL DATA:  Follow-up cerebellar infarct. History of hypertension, hyperlipidemia. EXAM: CT ANGIOGRAPHY HEAD AND NECK TECHNIQUE: Multidetector CT imaging of the head and neck was performed using the standard protocol during bolus administration of intravenous contrast. Multiplanar CT image reconstructions and MIPs were obtained to evaluate the vascular anatomy. Carotid stenosis measurements (when applicable) are obtained utilizing NASCET criteria, using the distal internal carotid diameter  as the denominator. CONTRAST:  80mL ISOVUE-370 IOPAMIDOL (ISOVUE-370) INJECTION 76% COMPARISON:  MRI and CT HEAD January 01, 2018. FINDINGS: CTA NECK FINDINGS: AORTIC ARCH: Common origin of the innominate and LEFT common carotid artery. LEFT vertebral artery arises directly from the aortic arch. Arch vessel origins are patent. Mild calcific atherosclerosis aortic arch. The origins of the innominate, left Common carotid artery and subclavian artery are widely patent. RIGHT CAROTID SYSTEM: Common carotid artery is patent. Normal appearance of the carotid bifurcation without hemodynamically significant stenosis by NASCET criteria. Normal appearance of the internal carotid artery. LEFT CAROTID SYSTEM: Common carotid artery is patent. Normal appearance of the carotid bifurcation without hemodynamically significant stenosis by NASCET  criteria. Normal appearance of the internal carotid artery. VERTEBRAL ARTERIES:RIGHT vertebral artery is dominant. Normal appearance of the vertebral arteries, widely patent. SKELETON: No acute osseous process though bone windows have not been submitted. OTHER NECK: Soft tissues of the neck are nonacute though, not tailored for evaluation. Thyromegaly with scattered calcifications, no dominant nodule. Hypoplastic mandible with hardware. UPPER CHEST: Severe centrilobular and paraseptal emphysema. LEFT upper chest wall deformity. LEFT calcified pleural plaque. CTA HEAD FINDINGS: ANTERIOR CIRCULATION: Patent cervical internal carotid arteries, petrous, cavernous and supra clinoid internal carotid arteries. Moderate stenosis RIGHT supraclinoid ICA. Patent anterior communicating artery. Patent anterior and middle cerebral arteries, mild luminal irregularity compatible with atherosclerosis. Long segment moderate stenosis RIGHT M1 segment. No large vessel occlusion, significant stenosis, contrast extravasation or aneurysm. POSTERIOR CIRCULATION: Patent vertebral arteries, vertebrobasilar junction and basilar artery, as well as main branch vessels. Patent RIGHT PICA. Patent posterior cerebral arteries. Robust LEFT posterior communicating artery. No large vessel occlusion, significant stenosis, contrast extravasation or aneurysm. VENOUS SINUSES: Major dural venous sinuses are patent though not tailored for evaluation on this angiographic examination. ANATOMIC VARIANTS: Hypoplastic LEFT P1 segment. DELAYED PHASE: Not performed. MIP images reviewed. IMPRESSION: CTA NECK: 1. No hemodynamically significant stenosis ICA. Patent vertebral arteries. CTA HEAD: 1. No emergent large vessel occlusion or flow-limiting stenosis. 2. Moderate stenosis RIGHT supraclinoid ICA and RIGHT M1 segment. Emphysema (ICD10-J43.9).  Aortic Atherosclerosis (ICD10-I70.0). Electronically Signed   By: Elon Alas M.D.   On: 01/01/2018 22:57   Dg  Chest 2 View  Result Date: 01/01/2018 CLINICAL DATA:  Chest pain EXAM: CHEST - 2 VIEW COMPARISON:  07/27/2016 FINDINGS: AP and lateral views of the chest show a hyperexpansion. The cardio pericardial silhouette is enlarged. Left base collapse/consolidation is associated with small left pleural effusion. May be some volume loss in the left hemithorax is well. Staple line again noted at the left apex. Bones are diffusely demineralized. Thoracolumbar scoliosis again noted. Telemetry leads overlie the chest. IMPRESSION: Retrocardiac left base collapse/consolidation with possible volume loss in the left hemithorax. Tiny left pleural effusion. Electronically Signed   By: Misty Stanley M.D.   On: 01/01/2018 15:06   Ct Head Wo Contrast  Result Date: 01/01/2018 CLINICAL DATA:  Generalized weakness for 3 days. Recent diagnosis of atrial fibrillation. EXAM: CT HEAD WITHOUT CONTRAST TECHNIQUE: Contiguous axial images were obtained from the base of the skull through the vertex without intravenous contrast. COMPARISON:  None. FINDINGS: Brain: No subdural, epidural, or subarachnoid hemorrhage. There is a rounded region of low attenuation in the right cerebellar hemisphere with mild regional mass effect. No associated hemorrhage. The remainder of the cerebellum is normal. The brainstem and basal cisterns are normal. Ventricles and sulci are unremarkable. No acute cortical ischemia or infarct otherwise seen. No midline shift. Vascular: Calcified atherosclerosis in the intracranial carotids.  Skull: Normal. Negative for fracture or focal lesion. Sinuses/Orbits: No acute finding. Other: None. IMPRESSION: 1. Rounded region of low attenuation in the right cerebellar hemisphere with mild regional mass effect but no associated bleed is most consistent with an infarct, acute to subacute. 2. No other acute abnormalities. Findings called to Dr. Alvino Chapel. Electronically Signed   By: Dorise Bullion III M.D   On: 01/01/2018 15:31   Ct  Angio Neck W Or Wo Contrast  Result Date: 01/01/2018 CLINICAL DATA:  Follow-up cerebellar infarct. History of hypertension, hyperlipidemia. EXAM: CT ANGIOGRAPHY HEAD AND NECK TECHNIQUE: Multidetector CT imaging of the head and neck was performed using the standard protocol during bolus administration of intravenous contrast. Multiplanar CT image reconstructions and MIPs were obtained to evaluate the vascular anatomy. Carotid stenosis measurements (when applicable) are obtained utilizing NASCET criteria, using the distal internal carotid diameter as the denominator. CONTRAST:  14mL ISOVUE-370 IOPAMIDOL (ISOVUE-370) INJECTION 76% COMPARISON:  MRI and CT HEAD January 01, 2018. FINDINGS: CTA NECK FINDINGS: AORTIC ARCH: Common origin of the innominate and LEFT common carotid artery. LEFT vertebral artery arises directly from the aortic arch. Arch vessel origins are patent. Mild calcific atherosclerosis aortic arch. The origins of the innominate, left Common carotid artery and subclavian artery are widely patent. RIGHT CAROTID SYSTEM: Common carotid artery is patent. Normal appearance of the carotid bifurcation without hemodynamically significant stenosis by NASCET criteria. Normal appearance of the internal carotid artery. LEFT CAROTID SYSTEM: Common carotid artery is patent. Normal appearance of the carotid bifurcation without hemodynamically significant stenosis by NASCET criteria. Normal appearance of the internal carotid artery. VERTEBRAL ARTERIES:RIGHT vertebral artery is dominant. Normal appearance of the vertebral arteries, widely patent. SKELETON: No acute osseous process though bone windows have not been submitted. OTHER NECK: Soft tissues of the neck are nonacute though, not tailored for evaluation. Thyromegaly with scattered calcifications, no dominant nodule. Hypoplastic mandible with hardware. UPPER CHEST: Severe centrilobular and paraseptal emphysema. LEFT upper chest wall deformity. LEFT calcified pleural  plaque. CTA HEAD FINDINGS: ANTERIOR CIRCULATION: Patent cervical internal carotid arteries, petrous, cavernous and supra clinoid internal carotid arteries. Moderate stenosis RIGHT supraclinoid ICA. Patent anterior communicating artery. Patent anterior and middle cerebral arteries, mild luminal irregularity compatible with atherosclerosis. Long segment moderate stenosis RIGHT M1 segment. No large vessel occlusion, significant stenosis, contrast extravasation or aneurysm. POSTERIOR CIRCULATION: Patent vertebral arteries, vertebrobasilar junction and basilar artery, as well as main branch vessels. Patent RIGHT PICA. Patent posterior cerebral arteries. Robust LEFT posterior communicating artery. No large vessel occlusion, significant stenosis, contrast extravasation or aneurysm. VENOUS SINUSES: Major dural venous sinuses are patent though not tailored for evaluation on this angiographic examination. ANATOMIC VARIANTS: Hypoplastic LEFT P1 segment. DELAYED PHASE: Not performed. MIP images reviewed. IMPRESSION: CTA NECK: 1. No hemodynamically significant stenosis ICA. Patent vertebral arteries. CTA HEAD: 1. No emergent large vessel occlusion or flow-limiting stenosis. 2. Moderate stenosis RIGHT supraclinoid ICA and RIGHT M1 segment. Emphysema (ICD10-J43.9).  Aortic Atherosclerosis (ICD10-I70.0). Electronically Signed   By: Elon Alas M.D.   On: 01/01/2018 22:57   Mr Brain Wo Contrast  Result Date: 01/01/2018 CLINICAL DATA:  Generalized weakness, LEFT facial droop for a few days. Follow up stroke. History of hypertension, hyperlipidemia. EXAM: MRI HEAD WITHOUT CONTRAST TECHNIQUE: Multiplanar, multiecho pulse sequences of the brain and surrounding structures were obtained without intravenous contrast. COMPARISON:  CT HEAD January 01, 2018 FINDINGS: INTRACRANIAL CONTENTS: Wedge-like reduced diffusion RIGHT inferior cerebellum with low to normalized ADC values and RIGHT FLAIR signal. A few punctate foci  reduced diffusion  RIGHT mid cerebellum. Regional mass effect without midline shift. Old small bilateral cerebellar infarcts. Chronic microhemorrhage LEFT occipital lobe without susceptibility artifact to suggest acute blood products. Old LEFT basal ganglia lacunar infarct. Prominent basal ganglia and thalami perivascular spaces associated chronic small vessel ischemic changes. Patchy supratentorial and pontine white matter FLAIR T2 hyperintensities compatible with chronic small vessel ischemic changes, normal for age. No parenchymal brain volume loss for age. No hydrocephalus. No abnormal extra-axial fluid collections. VASCULAR: Normal major intracranial vascular flow voids present at skull base. SKULL AND UPPER CERVICAL SPINE: No abnormal sellar expansion. No suspicious calvarial bone marrow signal. Craniocervical junction maintained. SINUSES/ORBITS: The mastoid air-cells and included paranasal sinuses are well-aerated.The included ocular globes and orbital contents are non-suspicious. Status post bilateral ocular lens implants. OTHER: None. IMPRESSION: 1. Acute nonhemorrhagic RIGHT cerebellar/PICA territory infarct. 2. Old small bilateral cerebellar and LEFT basal ganglia infarcts. Mild chronic small vessel ischemic changes. Electronically Signed   By: Elon Alas M.D.   On: 01/01/2018 22:13     Scheduled Meds: . apixaban  5 mg Oral BID  . atorvastatin  80 mg Oral QPM  . chlorhexidine  15 mL Mouth/Throat BID  . diltiazem  240 mg Oral Daily  . dorzolamide-timolol  1 drop Both Eyes BID  . enalapril  10 mg Oral Daily  . FLUoxetine  20 mg Oral Daily  . furosemide  40 mg Intravenous Daily  . latanoprost  1 drop Both Eyes QHS  . metoprolol succinate  25 mg Oral Daily  . potassium chloride SA  40 mEq Oral BID  . sodium chloride  1 drop Both Eyes TID   Continuous Infusions:  Marzetta Board, MD, PhD [facility] Pager [phone]  If 7PM-7AM, please contact night-coverage [email or web address] Password  [facility] 01/02/2018, 2:49 PM

## 2018-01-02 NOTE — Progress Notes (Signed)
Paged [facility] NP [facility], regarding pt bradycardia and SVR pause reported by Tele monitoring. Given order to continue monitoring and page back if pt becomes symptomatic. Pt asymptomatic at this time.  Stephanie Simmons

## 2018-01-02 NOTE — Progress Notes (Addendum)
STROKE TEAM PROGRESS NOTE  HPI { Dr Leonel Ramsay ): Stephanie Simmons is an 77 y.o. female hyperlipidemia, hypertension, left ventricular heart failure.  Patient sister has note for last few days she has had decreased oral intake and an apparent left facial droop.  Patient is also on Eliquis with chronic A. fib.  There is question if she is compliant with her medications.  The patient's failure to thrive, decreased oral intake, abdominal pain and facial droop she was brought to the emergency department today by her sister.  Patient obtained a CT which showed right cerebellar hemisphere subacute stroke.  Date last known well: Unable to determine Time last known well: Unable to determine tPA Given: No: Out of the window NIH stroke scale 2  Modified Rankin: Rankin Score=3   INTERVAL HISTORY No family is at the bedside.  She recounted HPI. No new episodes. Appears stable. Pt interactive in conversation.  Vitals:   01/02/18 0030 01/02/18 0223 01/02/18 0353 01/02/18 0730  BP: 120/77 122/77 124/75 (P) 134/85  Pulse: (!) 51 (!) 51 (!) 51 (!) (P) 54  Resp:   18 (P) 18  Temp: 97.6 F (36.4 C) 97.9 F (36.6 C) 98 F (36.7 C) (P) 97.8 F (36.6 C)  TempSrc: Oral Oral Oral (P) Oral  SpO2: 93% 94% 97% (P) 90%  Weight:   51.9 kg (114 lb 6.7 oz)   Height:        CBC:  Recent Labs  Lab 01/01/18 1440 01/02/18 0551  WBC 7.5 7.5  NEUTROABS  --  4.7  HGB 13.8 13.4  HCT 45.2 44.3  MCV 81.6 81.7  PLT 245 811    Basic Metabolic Panel:  Recent Labs  Lab 01/01/18 1440 01/02/18 0551  NA 136 137  K 4.9 3.6  CL 104 103  CO2 21* 23  GLUCOSE 85 101*  BUN 16 12  CREATININE 0.86 0.76  CALCIUM 9.2 9.0  MG  --  1.7   Lipid Panel:     Component Value Date/Time   CHOL 145 01/02/2018 0551   TRIG 59 01/02/2018 0551   HDL 39 (L) 01/02/2018 0551   CHOLHDL 3.7 01/02/2018 0551   VLDL 12 01/02/2018 0551   LDLCALC 94 01/02/2018 0551   HgbA1c:  Lab Results  Component Value Date   HGBA1C 6.2  (H) 09/25/2013   Urine Drug Screen:     Component Value Date/Time   LABOPIA NONE DETECTED 01/01/2018 1614   COCAINSCRNUR NONE DETECTED 01/01/2018 1614   LABBENZ NONE DETECTED 01/01/2018 1614   AMPHETMU NONE DETECTED 01/01/2018 1614   THCU NONE DETECTED 01/01/2018 1614   LABBARB (A) 01/01/2018 1614    Result not available. Reagent lot number recalled by manufacturer.    Alcohol Level No results found for: ETH  IMAGING Ct Angio Head W Or Wo Contrast  Result Date: 01/01/2018 CLINICAL DATA:  Follow-up cerebellar infarct. History of hypertension, hyperlipidemia. EXAM: CT ANGIOGRAPHY HEAD AND NECK TECHNIQUE: Multidetector CT imaging of the head and neck was performed using the standard protocol during bolus administration of intravenous contrast. Multiplanar CT image reconstructions and MIPs were obtained to evaluate the vascular anatomy. Carotid stenosis measurements (when applicable) are obtained utilizing NASCET criteria, using the distal internal carotid diameter as the denominator. CONTRAST:  39mL ISOVUE-370 IOPAMIDOL (ISOVUE-370) INJECTION 76% COMPARISON:  MRI and CT HEAD January 01, 2018. FINDINGS: CTA NECK FINDINGS: AORTIC ARCH: Common origin of the innominate and LEFT common carotid artery. LEFT vertebral artery arises directly from the aortic arch. Arch  vessel origins are patent. Mild calcific atherosclerosis aortic arch. The origins of the innominate, left Common carotid artery and subclavian artery are widely patent. RIGHT CAROTID SYSTEM: Common carotid artery is patent. Normal appearance of the carotid bifurcation without hemodynamically significant stenosis by NASCET criteria. Normal appearance of the internal carotid artery. LEFT CAROTID SYSTEM: Common carotid artery is patent. Normal appearance of the carotid bifurcation without hemodynamically significant stenosis by NASCET criteria. Normal appearance of the internal carotid artery. VERTEBRAL ARTERIES:RIGHT vertebral artery is dominant.  Normal appearance of the vertebral arteries, widely patent. SKELETON: No acute osseous process though bone windows have not been submitted. OTHER NECK: Soft tissues of the neck are nonacute though, not tailored for evaluation. Thyromegaly with scattered calcifications, no dominant nodule. Hypoplastic mandible with hardware. UPPER CHEST: Severe centrilobular and paraseptal emphysema. LEFT upper chest wall deformity. LEFT calcified pleural plaque. CTA HEAD FINDINGS: ANTERIOR CIRCULATION: Patent cervical internal carotid arteries, petrous, cavernous and supra clinoid internal carotid arteries. Moderate stenosis RIGHT supraclinoid ICA. Patent anterior communicating artery. Patent anterior and middle cerebral arteries, mild luminal irregularity compatible with atherosclerosis. Long segment moderate stenosis RIGHT M1 segment. No large vessel occlusion, significant stenosis, contrast extravasation or aneurysm. POSTERIOR CIRCULATION: Patent vertebral arteries, vertebrobasilar junction and basilar artery, as well as main branch vessels. Patent RIGHT PICA. Patent posterior cerebral arteries. Robust LEFT posterior communicating artery. No large vessel occlusion, significant stenosis, contrast extravasation or aneurysm. VENOUS SINUSES: Major dural venous sinuses are patent though not tailored for evaluation on this angiographic examination. ANATOMIC VARIANTS: Hypoplastic LEFT P1 segment. DELAYED PHASE: Not performed. MIP images reviewed. IMPRESSION: CTA NECK: 1. No hemodynamically significant stenosis ICA. Patent vertebral arteries. CTA HEAD: 1. No emergent large vessel occlusion or flow-limiting stenosis. 2. Moderate stenosis RIGHT supraclinoid ICA and RIGHT M1 segment. Emphysema (ICD10-J43.9).  Aortic Atherosclerosis (ICD10-I70.0). Electronically Signed   By: Elon Alas M.D.   On: 01/01/2018 22:57   Dg Chest 2 View  Result Date: 01/01/2018 CLINICAL DATA:  Chest pain EXAM: CHEST - 2 VIEW COMPARISON:  07/27/2016  FINDINGS: AP and lateral views of the chest show a hyperexpansion. The cardio pericardial silhouette is enlarged. Left base collapse/consolidation is associated with small left pleural effusion. May be some volume loss in the left hemithorax is well. Staple line again noted at the left apex. Bones are diffusely demineralized. Thoracolumbar scoliosis again noted. Telemetry leads overlie the chest. IMPRESSION: Retrocardiac left base collapse/consolidation with possible volume loss in the left hemithorax. Tiny left pleural effusion. Electronically Signed   By: Misty Stanley M.D.   On: 01/01/2018 15:06   Ct Head Wo Contrast  Result Date: 01/01/2018 CLINICAL DATA:  Generalized weakness for 3 days. Recent diagnosis of atrial fibrillation. EXAM: CT HEAD WITHOUT CONTRAST TECHNIQUE: Contiguous axial images were obtained from the base of the skull through the vertex without intravenous contrast. COMPARISON:  None. FINDINGS: Brain: No subdural, epidural, or subarachnoid hemorrhage. There is a rounded region of low attenuation in the right cerebellar hemisphere with mild regional mass effect. No associated hemorrhage. The remainder of the cerebellum is normal. The brainstem and basal cisterns are normal. Ventricles and sulci are unremarkable. No acute cortical ischemia or infarct otherwise seen. No midline shift. Vascular: Calcified atherosclerosis in the intracranial carotids. Skull: Normal. Negative for fracture or focal lesion. Sinuses/Orbits: No acute finding. Other: None. IMPRESSION: 1. Rounded region of low attenuation in the right cerebellar hemisphere with mild regional mass effect but no associated bleed is most consistent with an infarct, acute to subacute. 2. No  other acute abnormalities. Findings called to Dr. Alvino Chapel. Electronically Signed   By: Dorise Bullion III M.D   On: 01/01/2018 15:31   Ct Angio Neck W Or Wo Contrast  Result Date: 01/01/2018 CLINICAL DATA:  Follow-up cerebellar infarct. History of  hypertension, hyperlipidemia. EXAM: CT ANGIOGRAPHY HEAD AND NECK TECHNIQUE: Multidetector CT imaging of the head and neck was performed using the standard protocol during bolus administration of intravenous contrast. Multiplanar CT image reconstructions and MIPs were obtained to evaluate the vascular anatomy. Carotid stenosis measurements (when applicable) are obtained utilizing NASCET criteria, using the distal internal carotid diameter as the denominator. CONTRAST:  78mL ISOVUE-370 IOPAMIDOL (ISOVUE-370) INJECTION 76% COMPARISON:  MRI and CT HEAD January 01, 2018. FINDINGS: CTA NECK FINDINGS: AORTIC ARCH: Common origin of the innominate and LEFT common carotid artery. LEFT vertebral artery arises directly from the aortic arch. Arch vessel origins are patent. Mild calcific atherosclerosis aortic arch. The origins of the innominate, left Common carotid artery and subclavian artery are widely patent. RIGHT CAROTID SYSTEM: Common carotid artery is patent. Normal appearance of the carotid bifurcation without hemodynamically significant stenosis by NASCET criteria. Normal appearance of the internal carotid artery. LEFT CAROTID SYSTEM: Common carotid artery is patent. Normal appearance of the carotid bifurcation without hemodynamically significant stenosis by NASCET criteria. Normal appearance of the internal carotid artery. VERTEBRAL ARTERIES:RIGHT vertebral artery is dominant. Normal appearance of the vertebral arteries, widely patent. SKELETON: No acute osseous process though bone windows have not been submitted. OTHER NECK: Soft tissues of the neck are nonacute though, not tailored for evaluation. Thyromegaly with scattered calcifications, no dominant nodule. Hypoplastic mandible with hardware. UPPER CHEST: Severe centrilobular and paraseptal emphysema. LEFT upper chest wall deformity. LEFT calcified pleural plaque. CTA HEAD FINDINGS: ANTERIOR CIRCULATION: Patent cervical internal carotid arteries, petrous, cavernous and  supra clinoid internal carotid arteries. Moderate stenosis RIGHT supraclinoid ICA. Patent anterior communicating artery. Patent anterior and middle cerebral arteries, mild luminal irregularity compatible with atherosclerosis. Long segment moderate stenosis RIGHT M1 segment. No large vessel occlusion, significant stenosis, contrast extravasation or aneurysm. POSTERIOR CIRCULATION: Patent vertebral arteries, vertebrobasilar junction and basilar artery, as well as main branch vessels. Patent RIGHT PICA. Patent posterior cerebral arteries. Robust LEFT posterior communicating artery. No large vessel occlusion, significant stenosis, contrast extravasation or aneurysm. VENOUS SINUSES: Major dural venous sinuses are patent though not tailored for evaluation on this angiographic examination. ANATOMIC VARIANTS: Hypoplastic LEFT P1 segment. DELAYED PHASE: Not performed. MIP images reviewed. IMPRESSION: CTA NECK: 1. No hemodynamically significant stenosis ICA. Patent vertebral arteries. CTA HEAD: 1. No emergent large vessel occlusion or flow-limiting stenosis. 2. Moderate stenosis RIGHT supraclinoid ICA and RIGHT M1 segment. Emphysema (ICD10-J43.9).  Aortic Atherosclerosis (ICD10-I70.0). Electronically Signed   By: Elon Alas M.D.   On: 01/01/2018 22:57   Mr Brain Wo Contrast  Result Date: 01/01/2018 CLINICAL DATA:  Generalized weakness, LEFT facial droop for a few days. Follow up stroke. History of hypertension, hyperlipidemia. EXAM: MRI HEAD WITHOUT CONTRAST TECHNIQUE: Multiplanar, multiecho pulse sequences of the brain and surrounding structures were obtained without intravenous contrast. COMPARISON:  CT HEAD January 01, 2018 FINDINGS: INTRACRANIAL CONTENTS: Wedge-like reduced diffusion RIGHT inferior cerebellum with low to normalized ADC values and RIGHT FLAIR signal. A few punctate foci reduced diffusion RIGHT mid cerebellum. Regional mass effect without midline shift. Old small bilateral cerebellar infarcts. Chronic  microhemorrhage LEFT occipital lobe without susceptibility artifact to suggest acute blood products. Old LEFT basal ganglia lacunar infarct. Prominent basal ganglia and thalami perivascular spaces associated chronic small  vessel ischemic changes. Patchy supratentorial and pontine white matter FLAIR T2 hyperintensities compatible with chronic small vessel ischemic changes, normal for age. No parenchymal brain volume loss for age. No hydrocephalus. No abnormal extra-axial fluid collections. VASCULAR: Normal major intracranial vascular flow voids present at skull base. SKULL AND UPPER CERVICAL SPINE: No abnormal sellar expansion. No suspicious calvarial bone marrow signal. Craniocervical junction maintained. SINUSES/ORBITS: The mastoid air-cells and included paranasal sinuses are well-aerated.The included ocular globes and orbital contents are non-suspicious. Status post bilateral ocular lens implants. OTHER: None. IMPRESSION: 1. Acute nonhemorrhagic RIGHT cerebellar/PICA territory infarct. 2. Old small bilateral cerebellar and LEFT basal ganglia infarcts. Mild chronic small vessel ischemic changes. Electronically Signed   By: Elon Alas M.D.   On: 01/01/2018 22:13    PHYSICAL EXAM Pleasant cachectic  elderly lady currently not in distress. . Afebrile. Head is nontraumatic. Neck is supple without bruit.    Cardiac exam no murmur or gallop. Lungs are clear to auscultation. Distal pulses are well felt. Neurological Exam ;  Awake  Alert oriented x 3. Normal speech and language except for mild dysarthria.[place] movements full without nystagmus.fundi were not visualized. Vision acuity and fields appear normal. Hearing is normal. Palatal movements are normal.Mild left lower face asymmetry.. Tongue midline. Normal strength, tone, reflexes and coordination including finger-to-nose and knee to heel coordination. Normal sensation. Gait deferred.  ASSESSMENT/PLAN Ms. Alaura Simmons is a 77 y.o. female with  history of HTN, HLD, LVHP, AF on eliquis, hypothyroidism and loop placed for syncope presenting with generalized weakness x 2 weeks and L facial weakness.   Stroke:  right cerebellar/PICA infarct embolic secondary to known atrial fibrillation on Eliquis  CT head rounded area R cerebellum  MRI  Acute R cerebellar/PICA infarct. Old sm B cerebellar and L BG infarcts. Small vessel disease.   CTA head no ELVO. Moderated stenosis R supraclinoid ICA and R Mi. Emphysema. Aortic atherosclerosis.  CTA neck Unremarkable   2D Echo  12/19/17 EF 65-70%. No source of embolus   LDL 94  HgbA1c pending 2  Eliquis for VTE prophylaxis  Eliquis (apixaban) daily prior to admission taking as prescribed, now on aspirin 325 mg x 1 and Eliquis (apixaban) daily. No indication to change DOAC. Recommend continuation of Eliquis. Pt will discuss further with Dr. [name] if he wants to change. No indication for additional aspirin from stroke standpoint   Therapy recommendations:  pending   Disposition:  pending   Atrial Fibrillation  Home anticoagulation:  Eliquis (apixaban) daily continued in the hospital  Continue Eliquis (apixaban) daily at discharge (see above)  Hypertension  Stable . Permissive hypertension (OK if < 220/120) but gradually normalize in 5-7 days . Long-term BP goal normotensive  Hyperlipidemia  Home meds:  lipitor 10  Increased lipitor to 80 on arrival to hospital  LDL 94, goal < 70  Continue statin at discharge  Other Stroke Risk Factors  Advanced age  Family hx stroke (sister)  Frail, Body mass index is 20.93 kg/m.   LV Congestive heart failure  Other Active Problems  CP with increased troponins  Hypothyroidism  Has Loop recorder, placed for syncope  Hospital day # [place], MSN, APRN, ANVP-BC, AGPCNP-BC Advanced Practice Stroke Nurse [place] for Schedule & Pager information 01/02/2018 10:25 AM  I have personally examined  this patient, reviewed notes, independently viewed imaging studies, participated in medical decision making and plan of care.ROS completed by me personally and pertinent positives fully documented  I  have made any additions or clarifications directly to the above note. Agree with note above. The patient has had embolic cerebellar infarct due to atrial fibrillation and noncompliance with eliquis is questionable. I counseled the patient to be compliant with her medications. We also discussed briefly artifact is medications but patient would like to stay on eliquis for now but she is willing to discuss this with the cardiologist Dr. [name] as an outpatient. I do not believe adion of aspirin as necessary as it would increase the bleeding risk without prolonged benefit. Continue ongoing stroke workup. No need for repeat echocardiogram she had one on 12/20/17. Long discussion with patient and with Dr. Gloris Ham and answered questions. Greater than 50% time during this 35 minute visit was spent in counseling and coordination of care about her embolic stroke, atrial fibrillation and risk benefit of anticoagulation and answered questions  Antony Contras, MD Medical Director [facility] Pager: [phone] 01/02/2018 1:50 PM  To contact Stroke Continuity provider, please refer to [email or web address] After hours, contact General Neurology

## 2018-01-03 DIAGNOSIS — I6300 Cerebral infarction due to thrombosis of unspecified precerebral artery: Secondary | ICD-10-CM

## 2018-01-03 DIAGNOSIS — I480 Paroxysmal atrial fibrillation: Secondary | ICD-10-CM

## 2018-01-03 LAB — GLUCOSE, CAPILLARY
GLUCOSE-CAPILLARY: 118 mg/dL — AB (ref 70–99)
GLUCOSE-CAPILLARY: 117 mg/dL — AB (ref 70–99)
Glucose-Capillary: 104 mg/dL — ABNORMAL HIGH (ref 70–99)
Glucose-Capillary: 111 mg/dL — ABNORMAL HIGH (ref 70–99)

## 2018-01-03 LAB — HEPATIC FUNCTION PANEL
ALT: 30 U/L (ref 0–44)
AST: 31 U/L (ref 15–41)
Albumin: 2.9 g/dL — ABNORMAL LOW (ref 3.5–5.0)
Alkaline Phosphatase: 41 U/L (ref 38–126)
BILIRUBIN DIRECT: 0.4 mg/dL — AB (ref 0.0–0.2)
BILIRUBIN INDIRECT: 0.6 mg/dL (ref 0.3–0.9)
BILIRUBIN TOTAL: 1 mg/dL (ref 0.3–1.2)
Total Protein: 5.3 g/dL — ABNORMAL LOW (ref 6.5–8.1)

## 2018-01-03 LAB — BASIC METABOLIC PANEL
ANION GAP: 11 (ref 5–15)
BUN: 13 mg/dL (ref 8–23)
CO2: 20 mmol/L — AB (ref 22–32)
Calcium: 9 mg/dL (ref 8.9–10.3)
Chloride: 105 mmol/L (ref 98–111)
Creatinine, Ser: 0.8 mg/dL (ref 0.44–1.00)
GFR calc Af Amer: >60 mL/min (ref >60)
GFR calc non Af Amer: >60 mL/min (ref >60)
GLUCOSE: 105 mg/dL — AB (ref 70–99)
POTASSIUM: 5 mmol/L (ref 3.5–5.1)
Sodium: 136 mmol/L (ref 135–145)

## 2018-01-03 LAB — ANCA TITERS: C-ANCA: <1:20 {titer}

## 2018-01-03 LAB — HIV ANTIBODY (ROUTINE TESTING W REFLEX): HIV Screen 4th Generation wRfx: NONREACTIVE

## 2018-01-03 MED ORDER — DILTIAZEM HCL ER COATED BEADS 120 MG PO CP24
120.0000 mg | ORAL_CAPSULE | Freq: Every day | ORAL | Status: DC
  Administered 2018-01-03 – 2018-01-04 (×2): 120 mg via ORAL
  Filled 2018-01-03 (×2): qty 1

## 2018-01-03 NOTE — Progress Notes (Signed)
Progress Note  Patient Name: Stephanie Simmons Date of Encounter: 01/03/2018  Primary Cardiologist: No primary care provider on file.   Subjective   Feeling well.  No chest pain or shortness of breath.  Inpatient Medications    Scheduled Meds: . apixaban  5 mg Oral BID  . atorvastatin  80 mg Oral QPM  . chlorhexidine  15 mL Mouth/Throat BID  . diltiazem  240 mg Oral Daily  . dorzolamide-timolol  1 drop Both Eyes BID  . enalapril  10 mg Oral Daily  . FLUoxetine  20 mg Oral Daily  . furosemide  40 mg Intravenous Daily  . latanoprost  1 drop Both Eyes QHS  . metoprolol succinate  25 mg Oral Daily  . potassium chloride SA  40 mEq Oral BID  . sodium chloride  1 drop Both Eyes TID   Continuous Infusions:  PRN Meds: acetaminophen **OR** acetaminophen (TYLENOL) oral liquid 160 mg/5 mL **OR** acetaminophen, polyvinyl alcohol, senna-docusate   Vital Signs    Vitals:   01/02/18 1606 01/02/18 2020 01/02/18 2339 01/03/18 0341  BP: 120/83 122/71 125/79 130/79  Pulse: 61 (!) 52 (!) 52 (!) 48  Resp: 18 20 20 20   Temp: 97.8 F (36.6 C) 98.1 F (36.7 C) 98 F (36.7 C) 98.2 F (36.8 C)  TempSrc: Oral Oral Oral Oral  SpO2: 94% (!) 88% 95% 94%  Weight:      Height:        Intake/Output Summary (Last 24 hours) at 01/03/2018 0946 Last data filed at 01/02/2018 2154 Gross per 24 hour  Intake 460 ml  Output -  Net 460 ml   Filed Weights   01/01/18 1356 01/02/18 0353  Weight: 118 lb (53.5 kg) 114 lb 6.7 oz (51.9 kg)    Telemetry    Sinus bradycardia. 40s-60s. - Personally Reviewed  ECG    n/a - Personally Reviewed  Physical Exam   VS:  BP 124/81 (BP Location: Right Arm)   Pulse (!) 50   Temp 98.2 F (36.8 C) (Oral)   Resp 18   Ht 5\' 2"  (1.575 m)   Wt 114 lb 6.7 oz (51.9 kg)   SpO2 96%   BMI 20.93 kg/m  , BMI Body mass index is 20.93 kg/m. GENERAL:  Well appearing, frail, elderly woman in no acute distress.  HEENT: Pupils equal round and reactive, fundi not  visualized, oral mucosa unremarkable NECK:  + jugular venous distention, waveform within normal limits, carotid upstroke brisk and symmetric, no bruits LUNGS:  Clear to auscultation bilaterally HEART: Bradycardic.  Irregularly irregular.  PMI not displaced or sustained,S1 and S2 within normal limits, no S3, no S4, no clicks, no rubs, no murmurs ABD:  Flat, positive bowel sounds normal in frequency in pitch, no bruits, no rebound, no guarding, no midline pulsatile mass, no hepatomegaly, no splenomegaly EXT:  2 plus pulses throughout, no edema, no cyanosis no clubbing SKIN:  No rashes no nodules NEURO:  Cranial nerves II through XII grossly intact, motor grossly intact throughout [facility]:  Cognitively intact, oriented to person place and time   Labs    Chemistry Recent Labs  Lab 01/01/18 1440 01/02/18 0551 01/03/18 0526  NA 136 137 136  K 4.9 3.6 5.0  CL 104 103 105  CO2 21* 23 20*  GLUCOSE 85 101* 105*  BUN 16 12 13   CREATININE 0.86 0.76 0.80  CALCIUM 9.2 9.0 9.0  PROT 5.6*  --  5.3*  ALBUMIN 3.3*  --  2.9*  AST 29  --  31  ALT 27  --  30  ALKPHOS 43  --  41  BILITOT 1.4*  --  1.0  GFRNONAA >60 >60 >60  GFRAA >60 >60 >60  ANIONGAP 11 11 11      Hematology Recent Labs  Lab 01/01/18 1440 01/02/18 0551  WBC 7.5 7.5  RBC 5.54* 5.42*  HGB 13.8 13.4  HCT 45.2 44.3  MCV 81.6 81.7  MCH 24.9* 24.7*  MCHC 30.5 30.2  RDW 18.2* 17.9*  PLT 245 252    Cardiac Enzymes Recent Labs  Lab 01/01/18 1440 01/01/18 1824 01/02/18 0551  TROPONINI 0.13* 0.11* 0.10*   No results for input(s): TROPIPOC in the last 168 hours.   BNP Recent Labs  Lab 01/01/18 1440  BNP 1,139.4*     DDimer No results for input(s): DDIMER in the last 168 hours.   Radiology    Ct Angio Head W Or Wo Contrast  Result Date: 01/01/2018 CLINICAL DATA:  Follow-up cerebellar infarct. History of hypertension, hyperlipidemia. EXAM: CT ANGIOGRAPHY HEAD AND NECK TECHNIQUE: Multidetector CT imaging of the  head and neck was performed using the standard protocol during bolus administration of intravenous contrast. Multiplanar CT image reconstructions and MIPs were obtained to evaluate the vascular anatomy. Carotid stenosis measurements (when applicable) are obtained utilizing NASCET criteria, using the distal internal carotid diameter as the denominator. CONTRAST:  77mL ISOVUE-370 IOPAMIDOL (ISOVUE-370) INJECTION 76% COMPARISON:  MRI and CT HEAD January 01, 2018. FINDINGS: CTA NECK FINDINGS: AORTIC ARCH: Common origin of the innominate and LEFT common carotid artery. LEFT vertebral artery arises directly from the aortic arch. Arch vessel origins are patent. Mild calcific atherosclerosis aortic arch. The origins of the innominate, left Common carotid artery and subclavian artery are widely patent. RIGHT CAROTID SYSTEM: Common carotid artery is patent. Normal appearance of the carotid bifurcation without hemodynamically significant stenosis by NASCET criteria. Normal appearance of the internal carotid artery. LEFT CAROTID SYSTEM: Common carotid artery is patent. Normal appearance of the carotid bifurcation without hemodynamically significant stenosis by NASCET criteria. Normal appearance of the internal carotid artery. VERTEBRAL ARTERIES:RIGHT vertebral artery is dominant. Normal appearance of the vertebral arteries, widely patent. SKELETON: No acute osseous process though bone windows have not been submitted. OTHER NECK: Soft tissues of the neck are nonacute though, not tailored for evaluation. Thyromegaly with scattered calcifications, no dominant nodule. Hypoplastic mandible with hardware. UPPER CHEST: Severe centrilobular and paraseptal emphysema. LEFT upper chest wall deformity. LEFT calcified pleural plaque. CTA HEAD FINDINGS: ANTERIOR CIRCULATION: Patent cervical internal carotid arteries, petrous, cavernous and supra clinoid internal carotid arteries. Moderate stenosis RIGHT supraclinoid ICA. Patent anterior  communicating artery. Patent anterior and middle cerebral arteries, mild luminal irregularity compatible with atherosclerosis. Long segment moderate stenosis RIGHT M1 segment. No large vessel occlusion, significant stenosis, contrast extravasation or aneurysm. POSTERIOR CIRCULATION: Patent vertebral arteries, vertebrobasilar junction and basilar artery, as well as main branch vessels. Patent RIGHT PICA. Patent posterior cerebral arteries. Robust LEFT posterior communicating artery. No large vessel occlusion, significant stenosis, contrast extravasation or aneurysm. VENOUS SINUSES: Major dural venous sinuses are patent though not tailored for evaluation on this angiographic examination. ANATOMIC VARIANTS: Hypoplastic LEFT P1 segment. DELAYED PHASE: Not performed. MIP images reviewed. IMPRESSION: CTA NECK: 1. No hemodynamically significant stenosis ICA. Patent vertebral arteries. CTA HEAD: 1. No emergent large vessel occlusion or flow-limiting stenosis. 2. Moderate stenosis RIGHT supraclinoid ICA and RIGHT M1 segment. Emphysema (ICD10-J43.9).  Aortic Atherosclerosis (ICD10-I70.0). Electronically Signed   By: Thana Farr.D.  On: 01/01/2018 22:57   Dg Chest 2 View  Result Date: 01/01/2018 CLINICAL DATA:  Chest pain EXAM: CHEST - 2 VIEW COMPARISON:  07/27/2016 FINDINGS: AP and lateral views of the chest show a hyperexpansion. The cardio pericardial silhouette is enlarged. Left base collapse/consolidation is associated with small left pleural effusion. May be some volume loss in the left hemithorax is well. Staple line again noted at the left apex. Bones are diffusely demineralized. Thoracolumbar scoliosis again noted. Telemetry leads overlie the chest. IMPRESSION: Retrocardiac left base collapse/consolidation with possible volume loss in the left hemithorax. Tiny left pleural effusion. Electronically Signed   By: Misty Stanley M.D.   On: 01/01/2018 15:06   Ct Head Wo Contrast  Result Date:  01/01/2018 CLINICAL DATA:  Generalized weakness for 3 days. Recent diagnosis of atrial fibrillation. EXAM: CT HEAD WITHOUT CONTRAST TECHNIQUE: Contiguous axial images were obtained from the base of the skull through the vertex without intravenous contrast. COMPARISON:  None. FINDINGS: Brain: No subdural, epidural, or subarachnoid hemorrhage. There is a rounded region of low attenuation in the right cerebellar hemisphere with mild regional mass effect. No associated hemorrhage. The remainder of the cerebellum is normal. The brainstem and basal cisterns are normal. Ventricles and sulci are unremarkable. No acute cortical ischemia or infarct otherwise seen. No midline shift. Vascular: Calcified atherosclerosis in the intracranial carotids. Skull: Normal. Negative for fracture or focal lesion. Sinuses/Orbits: No acute finding. Other: None. IMPRESSION: 1. Rounded region of low attenuation in the right cerebellar hemisphere with mild regional mass effect but no associated bleed is most consistent with an infarct, acute to subacute. 2. No other acute abnormalities. Findings called to Dr. Alvino Chapel. Electronically Signed   By: Dorise Bullion III M.D   On: 01/01/2018 15:31   Ct Angio Neck W Or Wo Contrast  Result Date: 01/01/2018 CLINICAL DATA:  Follow-up cerebellar infarct. History of hypertension, hyperlipidemia. EXAM: CT ANGIOGRAPHY HEAD AND NECK TECHNIQUE: Multidetector CT imaging of the head and neck was performed using the standard protocol during bolus administration of intravenous contrast. Multiplanar CT image reconstructions and MIPs were obtained to evaluate the vascular anatomy. Carotid stenosis measurements (when applicable) are obtained utilizing NASCET criteria, using the distal internal carotid diameter as the denominator. CONTRAST:  41mL ISOVUE-370 IOPAMIDOL (ISOVUE-370) INJECTION 76% COMPARISON:  MRI and CT HEAD January 01, 2018. FINDINGS: CTA NECK FINDINGS: AORTIC ARCH: Common origin of the innominate and  LEFT common carotid artery. LEFT vertebral artery arises directly from the aortic arch. Arch vessel origins are patent. Mild calcific atherosclerosis aortic arch. The origins of the innominate, left Common carotid artery and subclavian artery are widely patent. RIGHT CAROTID SYSTEM: Common carotid artery is patent. Normal appearance of the carotid bifurcation without hemodynamically significant stenosis by NASCET criteria. Normal appearance of the internal carotid artery. LEFT CAROTID SYSTEM: Common carotid artery is patent. Normal appearance of the carotid bifurcation without hemodynamically significant stenosis by NASCET criteria. Normal appearance of the internal carotid artery. VERTEBRAL ARTERIES:RIGHT vertebral artery is dominant. Normal appearance of the vertebral arteries, widely patent. SKELETON: No acute osseous process though bone windows have not been submitted. OTHER NECK: Soft tissues of the neck are nonacute though, not tailored for evaluation. Thyromegaly with scattered calcifications, no dominant nodule. Hypoplastic mandible with hardware. UPPER CHEST: Severe centrilobular and paraseptal emphysema. LEFT upper chest wall deformity. LEFT calcified pleural plaque. CTA HEAD FINDINGS: ANTERIOR CIRCULATION: Patent cervical internal carotid arteries, petrous, cavernous and supra clinoid internal carotid arteries. Moderate stenosis RIGHT supraclinoid ICA. Patent anterior communicating  artery. Patent anterior and middle cerebral arteries, mild luminal irregularity compatible with atherosclerosis. Long segment moderate stenosis RIGHT M1 segment. No large vessel occlusion, significant stenosis, contrast extravasation or aneurysm. POSTERIOR CIRCULATION: Patent vertebral arteries, vertebrobasilar junction and basilar artery, as well as main branch vessels. Patent RIGHT PICA. Patent posterior cerebral arteries. Robust LEFT posterior communicating artery. No large vessel occlusion, significant stenosis, contrast  extravasation or aneurysm. VENOUS SINUSES: Major dural venous sinuses are patent though not tailored for evaluation on this angiographic examination. ANATOMIC VARIANTS: Hypoplastic LEFT P1 segment. DELAYED PHASE: Not performed. MIP images reviewed. IMPRESSION: CTA NECK: 1. No hemodynamically significant stenosis ICA. Patent vertebral arteries. CTA HEAD: 1. No emergent large vessel occlusion or flow-limiting stenosis. 2. Moderate stenosis RIGHT supraclinoid ICA and RIGHT M1 segment. Emphysema (ICD10-J43.9).  Aortic Atherosclerosis (ICD10-I70.0). Electronically Signed   By: Elon Alas M.D.   On: 01/01/2018 22:57   Mr Brain Wo Contrast  Result Date: 01/01/2018 CLINICAL DATA:  Generalized weakness, LEFT facial droop for a few days. Follow up stroke. History of hypertension, hyperlipidemia. EXAM: MRI HEAD WITHOUT CONTRAST TECHNIQUE: Multiplanar, multiecho pulse sequences of the brain and surrounding structures were obtained without intravenous contrast. COMPARISON:  CT HEAD January 01, 2018 FINDINGS: INTRACRANIAL CONTENTS: Wedge-like reduced diffusion RIGHT inferior cerebellum with low to normalized ADC values and RIGHT FLAIR signal. A few punctate foci reduced diffusion RIGHT mid cerebellum. Regional mass effect without midline shift. Old small bilateral cerebellar infarcts. Chronic microhemorrhage LEFT occipital lobe without susceptibility artifact to suggest acute blood products. Old LEFT basal ganglia lacunar infarct. Prominent basal ganglia and thalami perivascular spaces associated chronic small vessel ischemic changes. Patchy supratentorial and pontine white matter FLAIR T2 hyperintensities compatible with chronic small vessel ischemic changes, normal for age. No parenchymal brain volume loss for age. No hydrocephalus. No abnormal extra-axial fluid collections. VASCULAR: Normal major intracranial vascular flow voids present at skull base. SKULL AND UPPER CERVICAL SPINE: No abnormal sellar expansion. No  suspicious calvarial bone marrow signal. Craniocervical junction maintained. SINUSES/ORBITS: The mastoid air-cells and included paranasal sinuses are well-aerated.The included ocular globes and orbital contents are non-suspicious. Status post bilateral ocular lens implants. OTHER: None. IMPRESSION: 1. Acute nonhemorrhagic RIGHT cerebellar/PICA territory infarct. 2. Old small bilateral cerebellar and LEFT basal ganglia infarcts. Mild chronic small vessel ischemic changes. Electronically Signed   By: Elon Alas M.D.   On: 01/01/2018 22:13   Nm Pulmonary Perf And Vent  Result Date: 01/02/2018 CLINICAL DATA:  Short of breath. Heart failure. Pulmonary hypertension. EXAM: NUCLEAR MEDICINE VENTILATION - PERFUSION LUNG SCAN TECHNIQUE: Ventilation images were obtained in multiple projections using inhaled aerosol Tc-51m DTPA. Perfusion images were obtained in multiple projections after intravenous injection of Tc-22m-MAA. RADIOPHARMACEUTICALS:  31.7 mCi of Tc-39m DTPA aerosol inhalation and 4.2 mCi Tc42m-MAA IV COMPARISON:  None. FINDINGS: Ventilation: No focal ventilation defect. Small peripheral ventilation defects in LEFT and RIGHT lung. Perfusion: No wedge shaped peripheral perfusion defects to suggest acute pulmonary embolism. IMPRESSION: 1. No evidence of acute pulmonary embolism. 2. Peripheral ventilation defects consistent with COPD/emphysema Electronically Signed   By: Suzy Bouchard M.D.   On: 01/02/2018 15:59    Cardiac Studies   Echo 12/22/17: Study Conclusions  - Left ventricle: LVEF is vigorous with midcavity obliteration   during systole The cavity size was normal. Wall thickness was   increased in a pattern of moderate LVH. Systolic function was   vigorous. The estimated ejection fraction was in the range of 65%   to 70%. - Aortic valve: There  was mild regurgitation. - Mitral valve: Calcified annulus. Mildly thickened leaflets .   There was mild regurgitation. - Left atrium: The  atrium was severely dilated. - Right atrium: The atrium was moderately to severely dilated. - Pulmonary arteries: PA peak pressure: 93 mm Hg (S). - Pericardium, extracardiac: A trivial pericardial effusion was   identified.  V/Q scan 01/03/18: IMPRESSION: 1. No evidence of acute pulmonary embolism. 2. Peripheral ventilation defects consistent with COPD/emphysema   Patient Profile     Ms. Kimery is a 89F with longstanding persistent atrial fibrillation, chronic diastolic heart failure, severe pulmonary hypertension, hypertension, and syncope here with acute on chronic diastolic heart failure and stroke.    Assessment & Plan    # Acute embolic stroke: 2/2 atrial fibrillation.  Unclear if she was compliant with eliquis.  She will be discharged to SNF so this won't be an issue.   # Persistent atrial fibrillation: # Bradycardia: Remains in atrial fibrillation and asymptomatic.  HR remains in the 40s-60s with pauses up to 2.7 seconds.  Will reduce diltiazem to 120mg  from 240 mg.  If rates remain<60 tomorrow would d/c diltiazem.  Continue metoprolol.  Continue Eliquis.  Compliance prior to admission was questionable, as she was living alone.  # Severe pulmonary hypertension: V/Q scan was negative for PE but did show COPD/emphysema. She reports that she quit smoking in 1994.  Rheumatologic labs are pending.  OK to keep appointment with Dr. Aundra Dubin.  Given her underlying pulmonary disease she likely isn't a candidate for pulmonary vasodilators.  Renal function stable with diuresis and we will switch to lasix 40mg  po bid.  She remains mildly volume overloaded.  She will need a BMP in 1 week.        For questions or updates, please contact [place] Please consult [email or web address] for contact info under Cardiology/STEMI.      Signed, Skeet Latch, MD  01/03/2018, 9:46 AM

## 2018-01-03 NOTE — Progress Notes (Addendum)
STROKE TEAM PROGRESS NOTE  HPI { Dr Leonel Ramsay ): Stephanie Simmons is an 77 y.o. female hyperlipidemia, hypertension, left ventricular heart failure.  Patient sister has note for last few days she has had decreased oral intake and an apparent left facial droop.  Patient is also on Eliquis with chronic A. fib.  There is question if she is compliant with her medications.  The patient's failure to thrive, decreased oral intake, abdominal pain and facial droop she was brought to the emergency department today by her sister.  Patient obtained a CT which showed right cerebellar hemisphere subacute stroke.  Date last known well: Unable to determine Time last known well: Unable to determine tPA Given: No: Out of the window NIH stroke scale 2  Modified Rankin: Rankin Score=3   INTERVAL HISTORY No family is at the bedside.  She she feels is not back to baseline. Therapy has recommended skilled nursing facility for rehabilitation  Vitals:   01/02/18 2339 01/03/18 0341 01/03/18 0900 01/03/18 1132  BP: 125/79 130/79 127/67 124/81  Pulse: (!) 52 (!) 48 (!) 50 (!) 50  Resp: 20 20 20 18   Temp: 98 F (36.7 C) 98.2 F (36.8 C) 97.8 F (36.6 C) 98.2 F (36.8 C)  TempSrc: Oral Oral Oral Oral  SpO2: 95% 94% 93% 96%  Weight:      Height:        CBC:  Recent Labs  Lab 01/01/18 1440 01/02/18 0551  WBC 7.5 7.5  NEUTROABS  --  4.7  HGB 13.8 13.4  HCT 45.2 44.3  MCV 81.6 81.7  PLT 245 789    Basic Metabolic Panel:  Recent Labs  Lab 01/02/18 0551 01/03/18 0526  NA 137 136  K 3.6 5.0  CL 103 105  CO2 23 20*  GLUCOSE 101* 105*  BUN 12 13  CREATININE 0.76 0.80  CALCIUM 9.0 9.0  MG 1.7  --    Lipid Panel:     Component Value Date/Time   CHOL 145 01/02/2018 0551   TRIG 59 01/02/2018 0551   HDL 39 (L) 01/02/2018 0551   CHOLHDL 3.7 01/02/2018 0551   VLDL 12 01/02/2018 0551   LDLCALC 94 01/02/2018 0551   HgbA1c:  Lab Results  Component Value Date   HGBA1C 6.2 (H) 01/02/2018    Urine Drug Screen:     Component Value Date/Time   LABOPIA NONE DETECTED 01/01/2018 1614   COCAINSCRNUR NONE DETECTED 01/01/2018 1614   LABBENZ NONE DETECTED 01/01/2018 1614   AMPHETMU NONE DETECTED 01/01/2018 1614   THCU NONE DETECTED 01/01/2018 1614   LABBARB (A) 01/01/2018 1614    Result not available. Reagent lot number recalled by manufacturer.    Alcohol Level No results found for: ETH  IMAGING Ct Angio Head W Or Wo Contrast  Result Date: 01/01/2018 CLINICAL DATA:  Follow-up cerebellar infarct. History of hypertension, hyperlipidemia. EXAM: CT ANGIOGRAPHY HEAD AND NECK TECHNIQUE: Multidetector CT imaging of the head and neck was performed using the standard protocol during bolus administration of intravenous contrast. Multiplanar CT image reconstructions and MIPs were obtained to evaluate the vascular anatomy. Carotid stenosis measurements (when applicable) are obtained utilizing NASCET criteria, using the distal internal carotid diameter as the denominator. CONTRAST:  23mL ISOVUE-370 IOPAMIDOL (ISOVUE-370) INJECTION 76% COMPARISON:  MRI and CT HEAD January 01, 2018. FINDINGS: CTA NECK FINDINGS: AORTIC ARCH: Common origin of the innominate and LEFT common carotid artery. LEFT vertebral artery arises directly from the aortic arch. Arch vessel origins are patent. Mild calcific atherosclerosis  aortic arch. The origins of the innominate, left Common carotid artery and subclavian artery are widely patent. RIGHT CAROTID SYSTEM: Common carotid artery is patent. Normal appearance of the carotid bifurcation without hemodynamically significant stenosis by NASCET criteria. Normal appearance of the internal carotid artery. LEFT CAROTID SYSTEM: Common carotid artery is patent. Normal appearance of the carotid bifurcation without hemodynamically significant stenosis by NASCET criteria. Normal appearance of the internal carotid artery. VERTEBRAL ARTERIES:RIGHT vertebral artery is dominant. Normal appearance of  the vertebral arteries, widely patent. SKELETON: No acute osseous process though bone windows have not been submitted. OTHER NECK: Soft tissues of the neck are nonacute though, not tailored for evaluation. Thyromegaly with scattered calcifications, no dominant nodule. Hypoplastic mandible with hardware. UPPER CHEST: Severe centrilobular and paraseptal emphysema. LEFT upper chest wall deformity. LEFT calcified pleural plaque. CTA HEAD FINDINGS: ANTERIOR CIRCULATION: Patent cervical internal carotid arteries, petrous, cavernous and supra clinoid internal carotid arteries. Moderate stenosis RIGHT supraclinoid ICA. Patent anterior communicating artery. Patent anterior and middle cerebral arteries, mild luminal irregularity compatible with atherosclerosis. Long segment moderate stenosis RIGHT M1 segment. No large vessel occlusion, significant stenosis, contrast extravasation or aneurysm. POSTERIOR CIRCULATION: Patent vertebral arteries, vertebrobasilar junction and basilar artery, as well as main branch vessels. Patent RIGHT PICA. Patent posterior cerebral arteries. Robust LEFT posterior communicating artery. No large vessel occlusion, significant stenosis, contrast extravasation or aneurysm. VENOUS SINUSES: Major dural venous sinuses are patent though not tailored for evaluation on this angiographic examination. ANATOMIC VARIANTS: Hypoplastic LEFT P1 segment. DELAYED PHASE: Not performed. MIP images reviewed. IMPRESSION: CTA NECK: 1. No hemodynamically significant stenosis ICA. Patent vertebral arteries. CTA HEAD: 1. No emergent large vessel occlusion or flow-limiting stenosis. 2. Moderate stenosis RIGHT supraclinoid ICA and RIGHT M1 segment. Emphysema (ICD10-J43.9).  Aortic Atherosclerosis (ICD10-I70.0). Electronically Signed   By: Elon Alas M.D.   On: 01/01/2018 22:57   Dg Chest 2 View  Result Date: 01/01/2018 CLINICAL DATA:  Chest pain EXAM: CHEST - 2 VIEW COMPARISON:  07/27/2016 FINDINGS: AP and lateral  views of the chest show a hyperexpansion. The cardio pericardial silhouette is enlarged. Left base collapse/consolidation is associated with small left pleural effusion. May be some volume loss in the left hemithorax is well. Staple line again noted at the left apex. Bones are diffusely demineralized. Thoracolumbar scoliosis again noted. Telemetry leads overlie the chest. IMPRESSION: Retrocardiac left base collapse/consolidation with possible volume loss in the left hemithorax. Tiny left pleural effusion. Electronically Signed   By: Misty Stanley M.D.   On: 01/01/2018 15:06   Ct Head Wo Contrast  Result Date: 01/01/2018 CLINICAL DATA:  Generalized weakness for 3 days. Recent diagnosis of atrial fibrillation. EXAM: CT HEAD WITHOUT CONTRAST TECHNIQUE: Contiguous axial images were obtained from the base of the skull through the vertex without intravenous contrast. COMPARISON:  None. FINDINGS: Brain: No subdural, epidural, or subarachnoid hemorrhage. There is a rounded region of low attenuation in the right cerebellar hemisphere with mild regional mass effect. No associated hemorrhage. The remainder of the cerebellum is normal. The brainstem and basal cisterns are normal. Ventricles and sulci are unremarkable. No acute cortical ischemia or infarct otherwise seen. No midline shift. Vascular: Calcified atherosclerosis in the intracranial carotids. Skull: Normal. Negative for fracture or focal lesion. Sinuses/Orbits: No acute finding. Other: None. IMPRESSION: 1. Rounded region of low attenuation in the right cerebellar hemisphere with mild regional mass effect but no associated bleed is most consistent with an infarct, acute to subacute. 2. No other acute abnormalities. Findings called to Dr.  Pickering. Electronically Signed   By: Dorise Bullion III M.D   On: 01/01/2018 15:31   Ct Angio Neck W Or Wo Contrast  Result Date: 01/01/2018 CLINICAL DATA:  Follow-up cerebellar infarct. History of hypertension,  hyperlipidemia. EXAM: CT ANGIOGRAPHY HEAD AND NECK TECHNIQUE: Multidetector CT imaging of the head and neck was performed using the standard protocol during bolus administration of intravenous contrast. Multiplanar CT image reconstructions and MIPs were obtained to evaluate the vascular anatomy. Carotid stenosis measurements (when applicable) are obtained utilizing NASCET criteria, using the distal internal carotid diameter as the denominator. CONTRAST:  44mL ISOVUE-370 IOPAMIDOL (ISOVUE-370) INJECTION 76% COMPARISON:  MRI and CT HEAD January 01, 2018. FINDINGS: CTA NECK FINDINGS: AORTIC ARCH: Common origin of the innominate and LEFT common carotid artery. LEFT vertebral artery arises directly from the aortic arch. Arch vessel origins are patent. Mild calcific atherosclerosis aortic arch. The origins of the innominate, left Common carotid artery and subclavian artery are widely patent. RIGHT CAROTID SYSTEM: Common carotid artery is patent. Normal appearance of the carotid bifurcation without hemodynamically significant stenosis by NASCET criteria. Normal appearance of the internal carotid artery. LEFT CAROTID SYSTEM: Common carotid artery is patent. Normal appearance of the carotid bifurcation without hemodynamically significant stenosis by NASCET criteria. Normal appearance of the internal carotid artery. VERTEBRAL ARTERIES:RIGHT vertebral artery is dominant. Normal appearance of the vertebral arteries, widely patent. SKELETON: No acute osseous process though bone windows have not been submitted. OTHER NECK: Soft tissues of the neck are nonacute though, not tailored for evaluation. Thyromegaly with scattered calcifications, no dominant nodule. Hypoplastic mandible with hardware. UPPER CHEST: Severe centrilobular and paraseptal emphysema. LEFT upper chest wall deformity. LEFT calcified pleural plaque. CTA HEAD FINDINGS: ANTERIOR CIRCULATION: Patent cervical internal carotid arteries, petrous, cavernous and supra clinoid  internal carotid arteries. Moderate stenosis RIGHT supraclinoid ICA. Patent anterior communicating artery. Patent anterior and middle cerebral arteries, mild luminal irregularity compatible with atherosclerosis. Long segment moderate stenosis RIGHT M1 segment. No large vessel occlusion, significant stenosis, contrast extravasation or aneurysm. POSTERIOR CIRCULATION: Patent vertebral arteries, vertebrobasilar junction and basilar artery, as well as main branch vessels. Patent RIGHT PICA. Patent posterior cerebral arteries. Robust LEFT posterior communicating artery. No large vessel occlusion, significant stenosis, contrast extravasation or aneurysm. VENOUS SINUSES: Major dural venous sinuses are patent though not tailored for evaluation on this angiographic examination. ANATOMIC VARIANTS: Hypoplastic LEFT P1 segment. DELAYED PHASE: Not performed. MIP images reviewed. IMPRESSION: CTA NECK: 1. No hemodynamically significant stenosis ICA. Patent vertebral arteries. CTA HEAD: 1. No emergent large vessel occlusion or flow-limiting stenosis. 2. Moderate stenosis RIGHT supraclinoid ICA and RIGHT M1 segment. Emphysema (ICD10-J43.9).  Aortic Atherosclerosis (ICD10-I70.0). Electronically Signed   By: Elon Alas M.D.   On: 01/01/2018 22:57   Mr Brain Wo Contrast  Result Date: 01/01/2018 CLINICAL DATA:  Generalized weakness, LEFT facial droop for a few days. Follow up stroke. History of hypertension, hyperlipidemia. EXAM: MRI HEAD WITHOUT CONTRAST TECHNIQUE: Multiplanar, multiecho pulse sequences of the brain and surrounding structures were obtained without intravenous contrast. COMPARISON:  CT HEAD January 01, 2018 FINDINGS: INTRACRANIAL CONTENTS: Wedge-like reduced diffusion RIGHT inferior cerebellum with low to normalized ADC values and RIGHT FLAIR signal. A few punctate foci reduced diffusion RIGHT mid cerebellum. Regional mass effect without midline shift. Old small bilateral cerebellar infarcts. Chronic  microhemorrhage LEFT occipital lobe without susceptibility artifact to suggest acute blood products. Old LEFT basal ganglia lacunar infarct. Prominent basal ganglia and thalami perivascular spaces associated chronic small vessel ischemic changes. Patchy supratentorial and pontine  white matter FLAIR T2 hyperintensities compatible with chronic small vessel ischemic changes, normal for age. No parenchymal brain volume loss for age. No hydrocephalus. No abnormal extra-axial fluid collections. VASCULAR: Normal major intracranial vascular flow voids present at skull base. SKULL AND UPPER CERVICAL SPINE: No abnormal sellar expansion. No suspicious calvarial bone marrow signal. Craniocervical junction maintained. SINUSES/ORBITS: The mastoid air-cells and included paranasal sinuses are well-aerated.The included ocular globes and orbital contents are non-suspicious. Status post bilateral ocular lens implants. OTHER: None. IMPRESSION: 1. Acute nonhemorrhagic RIGHT cerebellar/PICA territory infarct. 2. Old small bilateral cerebellar and LEFT basal ganglia infarcts. Mild chronic small vessel ischemic changes. Electronically Signed   By: Elon Alas M.D.   On: 01/01/2018 22:13   Nm Pulmonary Perf And Vent  Result Date: 01/02/2018 CLINICAL DATA:  Short of breath. Heart failure. Pulmonary hypertension. EXAM: NUCLEAR MEDICINE VENTILATION - PERFUSION LUNG SCAN TECHNIQUE: Ventilation images were obtained in multiple projections using inhaled aerosol Tc-18m DTPA. Perfusion images were obtained in multiple projections after intravenous injection of Tc-51m-MAA. RADIOPHARMACEUTICALS:  31.7 mCi of Tc-67m DTPA aerosol inhalation and 4.2 mCi Tc57m-MAA IV COMPARISON:  None. FINDINGS: Ventilation: No focal ventilation defect. Small peripheral ventilation defects in LEFT and RIGHT lung. Perfusion: No wedge shaped peripheral perfusion defects to suggest acute pulmonary embolism. IMPRESSION: 1. No evidence of acute pulmonary embolism.  2. Peripheral ventilation defects consistent with COPD/emphysema Electronically Signed   By: Suzy Bouchard M.D.   On: 01/02/2018 15:59    PHYSICAL EXAM Pleasant cachectic  elderly lady currently not in distress. . Afebrile. Head is nontraumatic. Neck is supple without bruit.    Cardiac exam no murmur or gallop. Lungs are clear to auscultation. Distal pulses are well felt. Neurological Exam ;  Awake  Alert oriented x 3. Normal speech and language except for mild dysarthria.[place] movements full without nystagmus.fundi were not visualized. Vision acuity and fields appear normal. Hearing is normal. Palatal movements are normal.Mild left lower face asymmetry.. Tongue midline. Normal strength, tone, reflexes and coordination including finger-to-nose and knee to heel coordination. Normal sensation. Gait deferred.  ASSESSMENT/PLAN Stephanie Simmons is a 77 y.o. female with history of HTN, HLD, LVHP, AF on eliquis, hypothyroidism and loop placed for syncope presenting with generalized weakness x 2 weeks and L facial weakness.   Stroke:  right cerebellar/PICA infarct embolic secondary to known atrial fibrillation on Eliquis  CT head rounded area R cerebellum  MRI  Acute R cerebellar/PICA infarct. Old sm B cerebellar and L BG infarcts. Small vessel disease.   CTA head no ELVO. Moderated stenosis R supraclinoid ICA and R Mi. Emphysema. Aortic atherosclerosis.  CTA neck Unremarkable   2D Echo  12/19/17 EF 65-70%. No source of embolus   LDL 94  HgbA1c 6. 2  Eliquis for VTE prophylaxis  Eliquis (apixaban) daily prior to admission taking as prescribed, now on aspirin 325 mg x 1 and Eliquis (apixaban) daily. No indication to change DOAC. Recommend continuation of Eliquis. Pt will discuss further with Dr. [name] if he wants to change. No indication for additional aspirin from stroke standpoint   Therapy recommendations:  SNF  Disposition:  SNF   Atrial Fibrillation  Home anticoagulation:   Eliquis (apixaban) daily continued in the hospital  Continue Eliquis (apixaban) daily at discharge (see above)  Hypertension  Stable . Permissive hypertension (OK if < 220/120) but gradually normalize in 5-7 days . Long-term BP goal normotensive  Hyperlipidemia  Home meds:  lipitor 10  Increased lipitor to 80 on arrival to hospital  LDL 94, goal < 70  Continue statin at discharge  Other Stroke Risk Factors  Advanced age  Family hx stroke (sister)  Frail, Body mass index is 20.93 kg/m.   LV Congestive heart failure  Other Active Problems  CP with increased troponins  Hypothyroidism  Has Loop recorder, placed for syncope  Hospital day # 2   The patient has had embolic cerebellar infarct due to atrial fibrillation and noncompliance with eliquis is questionable. I counseled the patient to be compliant with her medications. We also discussed briefly alternative medications but patient would like to stay on eliquis for now but she is willing to discuss this with the cardiologist Dr. [name] as an outpatient. I do not believe addition of aspirin as  being unnecessary as it would increase the bleeding risk without prolonged benefit. Long discussion with patient and with Dr. Gloris Ham and answered questions. Greater than 50% time during this 25 minute visit was spent in counseling and coordination of care about her embolic stroke, atrial fibrillation and risk benefit of anticoagulation and answered questions.Follow-up as an outpatient in stroke clinic in 6 weeks. Stroke team will sign off. Kindly call for questions.  Antony Contras, MD Medical Director [facility] Pager: [phone] 01/03/2018 12:56 PM  To contact Stroke Continuity provider, please refer to [email or web address] After hours, contact General Neurology

## 2018-01-03 NOTE — Care Management Note (Signed)
Case Management Note  Patient Details  Name: Stephanie Simmons MRN: [id number] Date of Birth: [birth year]  Subjective/Objective:     Pt admitted with CVA. She is from home with family.               Action/Plan: PT/OT recommending SNF. CM following for d/c disposition.  Expected Discharge Date:                  Expected Discharge Plan:  Skilled Nursing Facility  In-House Referral:  Clinical Social Work  Discharge planning Services     Post Acute Care Choice:    Choice offered to:     DME Arranged:    DME Agency:     HH Arranged:    [place] Agency:     Status of Service:  In process, will continue to follow  If discussed at Long Length of Stay Meetings, dates discussed:    Additional Comments:  Pollie Friar, RN 01/03/2018, 11:29 AM

## 2018-01-03 NOTE — Progress Notes (Signed)
PROGRESS NOTE  Stephanie Simmons NKN:[id number] DOB: [birth year] DOA: 01/01/2018 PCP: Leighton Ruff, MD   LOS: 2 days   Brief Narrative / Interim history: 77 year old female here for hypertension, hyperlipidemia, pulmonary hypertension, chronic A. fib on Eliquis who presents with complaints of weakness on now for the past few days.  She is at the point where she needs assistance to ambulate.  MRI on admission was positive for cerebellar stroke and she was admitted to the hospital for CVA work-up.  Neurology was consulted.  Cardiology was also consulted due to reports of chest pains on admission and positive troponin.  Assessment & Plan: Principal Problem:   CVA (cerebral vascular accident) ([place]) Active Problems:   Atrial fibrillation ([place])   Acute on chronic diastolic CHF (congestive heart failure), NYHA class 3 (HCC)   Hyperlipidemia   Elevated troponin   Acute CVA -Showed acute nonhemorrhagic right cerebellar/PICA territory infarct as well as old small bilateral cerebellar and left basal ganglia infarcts. -Neurology following, discussed with Dr. Leonie Man -Patient has A. fib which places her at risk for strokes, and has missed at least a few days of Eliquis -She underwent a 2D echo just 2 weeks ago, will not repeat.  At that time her EF was 65 to 70%, and moderate LVH.  PA pressure was elevated to 93 -CT angiogram was done on 7/9 without hemodynamically significant stenosis -LDL is 94, she is on statin -Continue Eliquis  Persistent A. fib/acute on chronic diastolic CHF/severe pulmonary hypertension /chest pain on admission / HTN -Plan to be seen in CHF clinic, cardiology consulted currently and following in the hospital.  Appreciate input.  Troponin leak likely due to demand ischemia rather than acute MI. -She is to undergo a VQ scan, also check LFTs, HIV, ANA, RF, and ANCA.  She will need PFTs as an outpatient. -s/p Lasix x 2 now, has some incontinence so not acurate UOP. Weight down  4 lbs  Bradycardia -Decreased dose of Cardizem, d/w Dr. Oval Linsey today  History of hypothyroidism -No longer taking medication -TSH normal 1.98  Hyperlipidemia -Continue statin, increase to 80    DVT prophylaxis: Eliquis Code Status: Full code Family Communication: d/w sister over the phone  Disposition Plan: TBD  Consultants:   Neurology   Cardiology   Procedures:   2D echo:  Study Conclusions - Left ventricle: LVEF is vigorous with midcavity obliteration during systole The cavity size was normal. Wall thickness was increased in a pattern of moderate LVH. Systolic function was vigorous. The estimated ejection fraction was in the range of 65% to 70%. - Aortic valve: There was mild regurgitation. - Mitral valve: Calcified annulus. Mildly thickened leaflets . There was mild regurgitation. - Left atrium: The atrium was severely dilated. - Right atrium: The atrium was moderately to severely dilated. - Pulmonary arteries: PA peak pressure: 93 mm Hg (S). - Pericardium, extracardiac: A trivial pericardial effusion was identified.  Antimicrobials:  None    Subjective: - feeling better. No chest pain, no shortness of breath.   Objective: Vitals:   01/02/18 2020 01/02/18 2339 01/03/18 0341 01/03/18 0900  BP: 122/71 125/79 130/79 127/67  Pulse: (!) 52 (!) 52 (!) 48 (!) 50  Resp: 20 20 20 20   Temp: 98.1 F (36.7 C) 98 F (36.7 C) 98.2 F (36.8 C) 97.8 F (36.6 C)  TempSrc: Oral Oral Oral Oral  SpO2: (!) 88% 95% 94% 93%  Weight:      Height:        Intake/Output Summary (Last  24 hours) at 01/03/2018 1024 Last data filed at 01/02/2018 2154 Gross per 24 hour  Intake 460 ml  Output -  Net 460 ml   Filed Weights   January 13, 2018 1356 01/02/18 0353  Weight: 53.5 kg (118 lb) 51.9 kg (114 lb 6.7 oz)    Examination:  Constitutional: NAD Eyes: no scleral icterus  ENMT: mmm Respiratory: CTA, no wheezing  Cardiovascular: irregular, no murmurs. No edema  Abdomen: soft,  ND, ND, BS+ Neurologic: non focal    Data Reviewed: I have independently reviewed following labs and imaging studies    CBC: Recent Labs  Lab 01-13-18 1440 01/02/18 0551  WBC 7.5 7.5  NEUTROABS  --  4.7  HGB 13.8 13.4  HCT 45.2 44.3  MCV 81.6 81.7  PLT 245 762   Basic Metabolic Panel: Recent Labs  Lab 2018/01/13 1440 01/02/18 0551 01/03/18 0526  NA 136 137 136  K 4.9 3.6 5.0  CL 104 103 105  CO2 21* 23 20*  GLUCOSE 85 101* 105*  BUN 16 12 13   CREATININE 0.86 0.76 0.80  CALCIUM 9.2 9.0 9.0  MG  --  1.7  --    GFR: Estimated Creatinine Clearance: 46.6 mL/min (by C-G formula based on SCr of 0.8 mg/dL). Liver Function Tests: Recent Labs  Lab January 13, 2018 1440 01/03/18 0526  AST 29 31  ALT 27 30  ALKPHOS 43 41  BILITOT 1.4* 1.0  PROT 5.6* 5.3*  ALBUMIN 3.3* 2.9*   No results for input(s): LIPASE, AMYLASE in the last 168 hours. No results for input(s): AMMONIA in the last 168 hours. Coagulation Profile: Recent Labs  Lab 2018-01-13 1440  INR 1.55   Cardiac Enzymes: Recent Labs  Lab Jan 13, 2018 1440 01/13/18 1824 01/02/18 0551  TROPONINI 0.13* 0.11* 0.10*   BNP (last 3 results) No results for input(s): PROBNP in the last 8760 hours. HbA1C: Recent Labs    01/02/18 0551  HGBA1C 6.2*   CBG: Recent Labs  Lab 01/02/18 2157 01/03/18 0618  GLUCAP 127* 104*   Lipid Profile: Recent Labs    01/02/18 0551  CHOL 145  HDL 39*  LDLCALC 94  TRIG 59  CHOLHDL 3.7   Thyroid Function Tests: Recent Labs    2018-01-13 1824  TSH 1.980   Anemia Panel: No results for input(s): VITAMINB12, FOLATE, FERRITIN, TIBC, IRON, RETICCTPCT in the last 72 hours. Urine analysis:    Component Value Date/Time   COLORURINE YELLOW 04/26/2009 1213   APPEARANCEUR CLEAR 04/26/2009 1213   LABSPEC 1.012 04/26/2009 1213   PHURINE 7.0 04/26/2009 1213   GLUCOSEU NEGATIVE 04/26/2009 1213   HGBUR MODERATE (A) 04/26/2009 1213   BILIRUBINUR NEGATIVE 04/26/2009 1213   [place] 04/26/2009 1213   PROTEINUR NEGATIVE 04/26/2009 1213   UROBILINOGEN 0.2 04/26/2009 1213   NITRITE NEGATIVE 04/26/2009 1213   LEUKOCYTESUR NEGATIVE 04/26/2009 1213   Sepsis Labs: Invalid input(s): PROCALCITONIN, LACTICIDVEN  No results found for this or any previous visit (from the past 240 hour(s)).    Radiology Studies: Ct Angio Head W Or Wo Contrast  Result Date: 01/13/2018 CLINICAL DATA:  Follow-up cerebellar infarct. History of hypertension, hyperlipidemia. EXAM: CT ANGIOGRAPHY HEAD AND NECK TECHNIQUE: Multidetector CT imaging of the head and neck was performed using the standard protocol during bolus administration of intravenous contrast. Multiplanar CT image reconstructions and MIPs were obtained to evaluate the vascular anatomy. Carotid stenosis measurements (when applicable) are obtained utilizing NASCET criteria, using the distal internal carotid diameter as the denominator. CONTRAST:  28mL ISOVUE-370  IOPAMIDOL (ISOVUE-370) INJECTION 76% COMPARISON:  MRI and CT HEAD January 01, 2018. FINDINGS: CTA NECK FINDINGS: AORTIC ARCH: Common origin of the innominate and LEFT common carotid artery. LEFT vertebral artery arises directly from the aortic arch. Arch vessel origins are patent. Mild calcific atherosclerosis aortic arch. The origins of the innominate, left Common carotid artery and subclavian artery are widely patent. RIGHT CAROTID SYSTEM: Common carotid artery is patent. Normal appearance of the carotid bifurcation without hemodynamically significant stenosis by NASCET criteria. Normal appearance of the internal carotid artery. LEFT CAROTID SYSTEM: Common carotid artery is patent. Normal appearance of the carotid bifurcation without hemodynamically significant stenosis by NASCET criteria. Normal appearance of the internal carotid artery. VERTEBRAL ARTERIES:RIGHT vertebral artery is dominant. Normal appearance of the vertebral arteries, widely patent. SKELETON: No acute osseous process  though bone windows have not been submitted. OTHER NECK: Soft tissues of the neck are nonacute though, not tailored for evaluation. Thyromegaly with scattered calcifications, no dominant nodule. Hypoplastic mandible with hardware. UPPER CHEST: Severe centrilobular and paraseptal emphysema. LEFT upper chest wall deformity. LEFT calcified pleural plaque. CTA HEAD FINDINGS: ANTERIOR CIRCULATION: Patent cervical internal carotid arteries, petrous, cavernous and supra clinoid internal carotid arteries. Moderate stenosis RIGHT supraclinoid ICA. Patent anterior communicating artery. Patent anterior and middle cerebral arteries, mild luminal irregularity compatible with atherosclerosis. Long segment moderate stenosis RIGHT M1 segment. No large vessel occlusion, significant stenosis, contrast extravasation or aneurysm. POSTERIOR CIRCULATION: Patent vertebral arteries, vertebrobasilar junction and basilar artery, as well as main branch vessels. Patent RIGHT PICA. Patent posterior cerebral arteries. Robust LEFT posterior communicating artery. No large vessel occlusion, significant stenosis, contrast extravasation or aneurysm. VENOUS SINUSES: Major dural venous sinuses are patent though not tailored for evaluation on this angiographic examination. ANATOMIC VARIANTS: Hypoplastic LEFT P1 segment. DELAYED PHASE: Not performed. MIP images reviewed. IMPRESSION: CTA NECK: 1. No hemodynamically significant stenosis ICA. Patent vertebral arteries. CTA HEAD: 1. No emergent large vessel occlusion or flow-limiting stenosis. 2. Moderate stenosis RIGHT supraclinoid ICA and RIGHT M1 segment. Emphysema (ICD10-J43.9).  Aortic Atherosclerosis (ICD10-I70.0). Electronically Signed   By: Elon Alas M.D.   On: 01/01/2018 22:57   Dg Chest 2 View  Result Date: 01/01/2018 CLINICAL DATA:  Chest pain EXAM: CHEST - 2 VIEW COMPARISON:  07/27/2016 FINDINGS: AP and lateral views of the chest show a hyperexpansion. The cardio pericardial silhouette  is enlarged. Left base collapse/consolidation is associated with small left pleural effusion. May be some volume loss in the left hemithorax is well. Staple line again noted at the left apex. Bones are diffusely demineralized. Thoracolumbar scoliosis again noted. Telemetry leads overlie the chest. IMPRESSION: Retrocardiac left base collapse/consolidation with possible volume loss in the left hemithorax. Tiny left pleural effusion. Electronically Signed   By: Misty Stanley M.D.   On: 01/01/2018 15:06   Ct Head Wo Contrast  Result Date: 01/01/2018 CLINICAL DATA:  Generalized weakness for 3 days. Recent diagnosis of atrial fibrillation. EXAM: CT HEAD WITHOUT CONTRAST TECHNIQUE: Contiguous axial images were obtained from the base of the skull through the vertex without intravenous contrast. COMPARISON:  None. FINDINGS: Brain: No subdural, epidural, or subarachnoid hemorrhage. There is a rounded region of low attenuation in the right cerebellar hemisphere with mild regional mass effect. No associated hemorrhage. The remainder of the cerebellum is normal. The brainstem and basal cisterns are normal. Ventricles and sulci are unremarkable. No acute cortical ischemia or infarct otherwise seen. No midline shift. Vascular: Calcified atherosclerosis in the intracranial carotids. Skull: Normal. Negative for fracture or focal  lesion. Sinuses/Orbits: No acute finding. Other: None. IMPRESSION: 1. Rounded region of low attenuation in the right cerebellar hemisphere with mild regional mass effect but no associated bleed is most consistent with an infarct, acute to subacute. 2. No other acute abnormalities. Findings called to Dr. Alvino Chapel. Electronically Signed   By: Dorise Bullion III M.D   On: 01/01/2018 15:31   Ct Angio Neck W Or Wo Contrast  Result Date: 01/01/2018 CLINICAL DATA:  Follow-up cerebellar infarct. History of hypertension, hyperlipidemia. EXAM: CT ANGIOGRAPHY HEAD AND NECK TECHNIQUE: Multidetector CT imaging of  the head and neck was performed using the standard protocol during bolus administration of intravenous contrast. Multiplanar CT image reconstructions and MIPs were obtained to evaluate the vascular anatomy. Carotid stenosis measurements (when applicable) are obtained utilizing NASCET criteria, using the distal internal carotid diameter as the denominator. CONTRAST:  60mL ISOVUE-370 IOPAMIDOL (ISOVUE-370) INJECTION 76% COMPARISON:  MRI and CT HEAD January 01, 2018. FINDINGS: CTA NECK FINDINGS: AORTIC ARCH: Common origin of the innominate and LEFT common carotid artery. LEFT vertebral artery arises directly from the aortic arch. Arch vessel origins are patent. Mild calcific atherosclerosis aortic arch. The origins of the innominate, left Common carotid artery and subclavian artery are widely patent. RIGHT CAROTID SYSTEM: Common carotid artery is patent. Normal appearance of the carotid bifurcation without hemodynamically significant stenosis by NASCET criteria. Normal appearance of the internal carotid artery. LEFT CAROTID SYSTEM: Common carotid artery is patent. Normal appearance of the carotid bifurcation without hemodynamically significant stenosis by NASCET criteria. Normal appearance of the internal carotid artery. VERTEBRAL ARTERIES:RIGHT vertebral artery is dominant. Normal appearance of the vertebral arteries, widely patent. SKELETON: No acute osseous process though bone windows have not been submitted. OTHER NECK: Soft tissues of the neck are nonacute though, not tailored for evaluation. Thyromegaly with scattered calcifications, no dominant nodule. Hypoplastic mandible with hardware. UPPER CHEST: Severe centrilobular and paraseptal emphysema. LEFT upper chest wall deformity. LEFT calcified pleural plaque. CTA HEAD FINDINGS: ANTERIOR CIRCULATION: Patent cervical internal carotid arteries, petrous, cavernous and supra clinoid internal carotid arteries. Moderate stenosis RIGHT supraclinoid ICA. Patent anterior  communicating artery. Patent anterior and middle cerebral arteries, mild luminal irregularity compatible with atherosclerosis. Long segment moderate stenosis RIGHT M1 segment. No large vessel occlusion, significant stenosis, contrast extravasation or aneurysm. POSTERIOR CIRCULATION: Patent vertebral arteries, vertebrobasilar junction and basilar artery, as well as main branch vessels. Patent RIGHT PICA. Patent posterior cerebral arteries. Robust LEFT posterior communicating artery. No large vessel occlusion, significant stenosis, contrast extravasation or aneurysm. VENOUS SINUSES: Major dural venous sinuses are patent though not tailored for evaluation on this angiographic examination. ANATOMIC VARIANTS: Hypoplastic LEFT P1 segment. DELAYED PHASE: Not performed. MIP images reviewed. IMPRESSION: CTA NECK: 1. No hemodynamically significant stenosis ICA. Patent vertebral arteries. CTA HEAD: 1. No emergent large vessel occlusion or flow-limiting stenosis. 2. Moderate stenosis RIGHT supraclinoid ICA and RIGHT M1 segment. Emphysema (ICD10-J43.9).  Aortic Atherosclerosis (ICD10-I70.0). Electronically Signed   By: Elon Alas M.D.   On: 01/01/2018 22:57   Mr Brain Wo Contrast  Result Date: 01/01/2018 CLINICAL DATA:  Generalized weakness, LEFT facial droop for a few days. Follow up stroke. History of hypertension, hyperlipidemia. EXAM: MRI HEAD WITHOUT CONTRAST TECHNIQUE: Multiplanar, multiecho pulse sequences of the brain and surrounding structures were obtained without intravenous contrast. COMPARISON:  CT HEAD January 01, 2018 FINDINGS: INTRACRANIAL CONTENTS: Wedge-like reduced diffusion RIGHT inferior cerebellum with low to normalized ADC values and RIGHT FLAIR signal. A few punctate foci reduced diffusion RIGHT mid cerebellum. Regional mass  effect without midline shift. Old small bilateral cerebellar infarcts. Chronic microhemorrhage LEFT occipital lobe without susceptibility artifact to suggest acute blood  products. Old LEFT basal ganglia lacunar infarct. Prominent basal ganglia and thalami perivascular spaces associated chronic small vessel ischemic changes. Patchy supratentorial and pontine white matter FLAIR T2 hyperintensities compatible with chronic small vessel ischemic changes, normal for age. No parenchymal brain volume loss for age. No hydrocephalus. No abnormal extra-axial fluid collections. VASCULAR: Normal major intracranial vascular flow voids present at skull base. SKULL AND UPPER CERVICAL SPINE: No abnormal sellar expansion. No suspicious calvarial bone marrow signal. Craniocervical junction maintained. SINUSES/ORBITS: The mastoid air-cells and included paranasal sinuses are well-aerated.The included ocular globes and orbital contents are non-suspicious. Status post bilateral ocular lens implants. OTHER: None. IMPRESSION: 1. Acute nonhemorrhagic RIGHT cerebellar/PICA territory infarct. 2. Old small bilateral cerebellar and LEFT basal ganglia infarcts. Mild chronic small vessel ischemic changes. Electronically Signed   By: Elon Alas M.D.   On: 01/01/2018 22:13   Nm Pulmonary Perf And Vent  Result Date: 01/02/2018 CLINICAL DATA:  Short of breath. Heart failure. Pulmonary hypertension. EXAM: NUCLEAR MEDICINE VENTILATION - PERFUSION LUNG SCAN TECHNIQUE: Ventilation images were obtained in multiple projections using inhaled aerosol Tc-59m DTPA. Perfusion images were obtained in multiple projections after intravenous injection of Tc-53m-MAA. RADIOPHARMACEUTICALS:  31.7 mCi of Tc-70m DTPA aerosol inhalation and 4.2 mCi Tc15m-MAA IV COMPARISON:  None. FINDINGS: Ventilation: No focal ventilation defect. Small peripheral ventilation defects in LEFT and RIGHT lung. Perfusion: No wedge shaped peripheral perfusion defects to suggest acute pulmonary embolism. IMPRESSION: 1. No evidence of acute pulmonary embolism. 2. Peripheral ventilation defects consistent with COPD/emphysema Electronically Signed   By:  Suzy Bouchard M.D.   On: 01/02/2018 15:59     Scheduled Meds: . apixaban  5 mg Oral BID  . atorvastatin  80 mg Oral QPM  . chlorhexidine  15 mL Mouth/Throat BID  . diltiazem  120 mg Oral Daily  . dorzolamide-timolol  1 drop Both Eyes BID  . enalapril  10 mg Oral Daily  . FLUoxetine  20 mg Oral Daily  . furosemide  40 mg Intravenous Daily  . latanoprost  1 drop Both Eyes QHS  . metoprolol succinate  25 mg Oral Daily  . potassium chloride SA  40 mEq Oral BID  . sodium chloride  1 drop Both Eyes TID   Continuous Infusions:  Marzetta Board, MD, PhD [facility] Pager [phone]  If 7PM-7AM, please contact night-coverage [email or web address] Password [facility] 01/03/2018, 10:24 AM

## 2018-01-03 NOTE — NC FL2 (Signed)
[place] LEVEL OF CARE SCREENING TOOL     IDENTIFICATION  Patient Name: Stephanie Simmons Birthdate: [birth year] Sex: female Admission Date (Current Location): 01/01/2018  [facility] and [place] Number:  Herbalist and Address:  [facility] [place] [place] [address], [place], [place] [address]      Provider Number: [id number]  Attending Physician Name and Address:  Caren Griffins, MD  Relative Name and Phone Number:       Current Level of Care: Hospital Recommended Level of Care: [place] Prior Approval Number:    Date Approved/Denied:   PASRR Number: [id number]  Discharge Plan: SNF    Current Diagnoses: Patient Active Problem List   Diagnosis Date Noted  . Hyperlipidemia 01/02/2018  . Elevated troponin 01/02/2018  . CVA (cerebral vascular accident) ([place]) 01/01/2018  . Chest pain, atypical 09/23/2014  . Acute on chronic diastolic CHF (congestive heart failure), NYHA class 3 ([place]) 09/23/2014  . Acute diastolic heart failure ([place]) 09/23/2014  . Monocytosis 04/21/2014  . Neurofibromatosis ([place]) 04/21/2014  . Poor oral hygiene 04/21/2014  . Atrial fibrillation ([place]) 04/15/2014  . PSVT (paroxysmal supraventricular tachycardia) ([place]) 09/25/2013  . Syncope 09/10/2013  . Atrial flutter ([place]) 09/10/2013  . HTN (hypertension) 09/10/2013    Orientation RESPIRATION BLADDER Height & Weight     Self, Time, Situation, Place  Normal Continent Weight: 114 lb 6.7 oz (51.9 kg) Height:  5\' 2"  (157.5 cm)  BEHAVIORAL SYMPTOMS/MOOD NEUROLOGICAL BOWEL NUTRITION STATUS      Continent Diet(heart healthy)  AMBULATORY STATUS COMMUNICATION OF NEEDS Skin   Limited Assist Verbally Normal                       Personal Care Assistance Level of Assistance  Bathing, Feeding, Dressing Bathing Assistance: Limited assistance Feeding assistance: Independent Dressing Assistance: Limited assistance     Functional Limitations Info   Sight, Hearing, Speech Sight Info: Impaired Hearing Info: Adequate Speech Info: Impaired    SPECIAL CARE FACTORS FREQUENCY  PT (By licensed PT), OT (By licensed OT)     PT Frequency: 5x/wk OT Frequency: 5x/wk            Contractures Contractures Info: Not present    Additional Factors Info  Code Status, Allergies, Psychotropic Code Status Info: Full Allergies Info: NKA Psychotropic Info: Prozac 20mg          Current Medications (01/03/2018):  This is the current hospital active medication list Current Facility-Administered Medications  Medication Dose Route Frequency Provider Last Rate Last Dose  . acetaminophen (TYLENOL) tablet 650 mg  650 mg Oral Q4H PRN Fuller Plan A, MD       Or  . acetaminophen (TYLENOL) solution 650 mg  650 mg Per Tube Q4H PRN Fuller Plan A, MD       Or  . acetaminophen (TYLENOL) suppository 650 mg  650 mg Rectal Q4H PRN Fuller Plan A, MD      . apixaban (ELIQUIS) tablet 5 mg  5 mg Oral BID Fuller Plan A, MD   5 mg at 01/02/18 2144  . atorvastatin (LIPITOR) tablet 80 mg  80 mg Oral QPM Smith, Rondell A, MD   80 mg at 01/02/18 1645  . chlorhexidine (PERIDEX) 0.12 % solution 15 mL  15 mL Mouth/Throat BID Fuller Plan A, MD   15 mL at 01/02/18 2145  . diltiazem (CARDIZEM CD) 24 hr capsule 240 mg  240 mg Oral Daily Norval Morton, MD  240 mg at 01/02/18 1058  . dorzolamide-timolol (COSOPT) 22.3-6.8 MG/ML ophthalmic solution 1 drop  1 drop Both Eyes BID Fuller Plan A, MD   1 drop at 01/02/18 2147  . enalapril (VASOTEC) tablet 10 mg  10 mg Oral Daily Tamala Julian, Rondell A, MD   10 mg at 01/02/18 1059  . FLUoxetine (PROZAC) capsule 20 mg  20 mg Oral Daily Smith, Rondell A, MD   20 mg at 01/02/18 1058  . furosemide (LASIX) injection 40 mg  40 mg Intravenous Daily Kathyrn Drown D, NP   40 mg at 01/02/18 1500  . latanoprost (XALATAN) 0.005 % ophthalmic solution 1 drop  1 drop Both Eyes QHS Tamala Julian, Rondell A, MD   1 drop at 01/02/18 2147  .  metoprolol succinate (TOPROL-XL) 24 hr tablet 25 mg  25 mg Oral Daily Smith, Rondell A, MD   25 mg at 01/02/18 1059  . polyvinyl alcohol (LIQUIFILM TEARS) 1.4 % ophthalmic solution 1 drop  1 drop Both Eyes PRN Smith, Rondell A, MD      . potassium chloride SA (K-DUR,KLOR-CON) CR tablet 40 mEq  40 mEq Oral BID Fuller Plan A, MD   40 mEq at 01/02/18 2145  . senna-docusate (Senokot-S) tablet 1 tablet  1 tablet Oral QHS PRN Smith, Rondell A, MD      . sodium chloride (MURO 128) 2 % ophthalmic solution 1 drop  1 drop Both Eyes TID Norval Morton, MD   1 drop at 01/02/18 2146     Discharge Medications: Please see discharge summary for a list of discharge medications.  Relevant Imaging Results:  Relevant Lab Results:   Additional Information SS#: [id number]  Geralynn Ochs, LCSW

## 2018-01-03 NOTE — Progress Notes (Signed)
Physical Therapy Treatment Patient Details Name: Stephanie Simmons MRN: [id number] DOB: 1940-10-01 Today's Date: 01/03/2018    History of Present Illness Pt is a 77 y/o female admitted with history of HTN, HLD, LVHP, AF on eliquis, hypothyroidism and loop placed for syncope presenting with generalized weakness x 2 weeks and L facial weakness.  MRI + R cerebellar / PICA infarct    PT Comments    Pt making progress towards her goals today, however continues to have mild instability with mobility. Pt currently minA for bed mobility and power up with transfers from bed and toilet, and min guard for ambulation of 80 feet with RW. D/c plans remain appropriate at this time. PT will continue to follow acutely to improve strength and balance.    Follow Up Recommendations  SNF     Equipment Recommendations  None recommended by PT    Recommendations for Other Services       Precautions / Restrictions Precautions Precautions: Fall Restrictions Weight Bearing Restrictions: No    Mobility  Bed Mobility Overal bed mobility: Needs Assistance Bed Mobility: Supine to Sit;Sit to Supine     Supine to sit: Min assist     General bed mobility comments: increased time and effort, min A assist for trunk elevation  Transfers Overall transfer level: Needs assistance Equipment used: Rolling walker (2 wheeled) Transfers: Sit to/from Stand Sit to Stand: Min assist         General transfer comment: increased time and effort, min A to power into standing from EOB x1 and from toilet x1  Ambulation/Gait Ambulation/Gait assistance: Min guard Gait Distance (Feet): 100 Feet Assistive device: Rolling walker (2 wheeled) Gait Pattern/deviations: Step-through pattern;Decreased step length - right;Decreased step length - left;Decreased stride length;Shuffle Gait velocity: decreased Gait velocity interpretation: <1.31 ft/sec, indicative of household ambulator General Gait Details: min guard for  safety, slight instability but able to maintain balance with RW    Modified Rankin (Stroke Patients Only) Modified Rankin (Stroke Patients Only) Pre-Morbid Rankin Score: No symptoms Modified Rankin: Moderate disability     Balance Overall balance assessment: Needs assistance Sitting-balance support: Feet supported Sitting balance-Leahy Scale: Fair     Standing balance support: Bilateral upper extremity supported;During functional activity Standing balance-Leahy Scale: Poor                              Cognition Arousal/Alertness: Awake/alert Behavior During Therapy: WFL for tasks assessed/performed Overall Cognitive Status: Within Functional Limits for tasks assessed                                           General Comments General comments (skin integrity, edema, etc.): SaO2 on RA 92%O2 with ambualtion       Pertinent Vitals/Pain Pain Assessment: No/denies pain           PT Goals (current goals can now be found in the care plan section) Acute Rehab PT Goals PT Goal Formulation: With patient Time For Goal Achievement: 01/16/18 Potential to Achieve Goals: Good Progress towards PT goals: Progressing toward goals    Frequency    Min 4X/week      PT Plan Current plan remains appropriate       AM-PAC PT "6 Clicks" Daily Activity  Outcome Measure  Difficulty turning over in bed (including adjusting bedclothes, sheets and blankets)?: Unable Difficulty moving  from lying on back to sitting on the side of the bed? : Unable Difficulty sitting down on and standing up from a chair with arms (e.g., wheelchair, bedside commode, etc,.)?: Unable Help needed moving to and from a bed to chair (including a wheelchair)?: A Little Help needed walking in hospital room?: A Little Help needed climbing 3-5 steps with a railing? : A Lot 6 Click Score: 11    End of Session Equipment Utilized During Treatment: Gait belt Activity Tolerance: Patient  tolerated treatment well Patient left: in bed;with call bell/phone within reach;with bed alarm set Nurse Communication: Mobility status PT Visit Diagnosis: Other abnormalities of gait and mobility (R26.89);Muscle weakness (generalized) (M62.81)     Time: 9038-3338 PT Time Calculation (min) (ACUTE ONLY): 22 min  Charges:  $Gait Training: 8-22 mins                    G Codes:       Stephanie Simmons B. Migdalia Dk PT, DPT Acute Rehabilitation  [phone] Pager [phone]     [place] 01/03/2018, 4:28 PM

## 2018-01-04 DIAGNOSIS — G459 Transient cerebral ischemic attack, unspecified: Secondary | ICD-10-CM | POA: Diagnosis not present

## 2018-01-04 DIAGNOSIS — Z66 Do not resuscitate: Secondary | ICD-10-CM | POA: Diagnosis present

## 2018-01-04 DIAGNOSIS — I499 Cardiac arrhythmia, unspecified: Secondary | ICD-10-CM | POA: Diagnosis not present

## 2018-01-04 DIAGNOSIS — I480 Paroxysmal atrial fibrillation: Secondary | ICD-10-CM | POA: Diagnosis not present

## 2018-01-04 DIAGNOSIS — I2721 Secondary pulmonary arterial hypertension: Secondary | ICD-10-CM | POA: Diagnosis present

## 2018-01-04 DIAGNOSIS — I639 Cerebral infarction, unspecified: Secondary | ICD-10-CM | POA: Diagnosis not present

## 2018-01-04 DIAGNOSIS — R4701 Aphasia: Secondary | ICD-10-CM | POA: Diagnosis present

## 2018-01-04 DIAGNOSIS — I482 Chronic atrial fibrillation: Secondary | ICD-10-CM | POA: Diagnosis not present

## 2018-01-04 DIAGNOSIS — E039 Hypothyroidism, unspecified: Secondary | ICD-10-CM | POA: Diagnosis present

## 2018-01-04 DIAGNOSIS — M6281 Muscle weakness (generalized): Secondary | ICD-10-CM | POA: Diagnosis not present

## 2018-01-04 DIAGNOSIS — R414 Neurologic neglect syndrome: Secondary | ICD-10-CM | POA: Diagnosis present

## 2018-01-04 DIAGNOSIS — E875 Hyperkalemia: Secondary | ICD-10-CM | POA: Diagnosis present

## 2018-01-04 DIAGNOSIS — Z8249 Family history of ischemic heart disease and other diseases of the circulatory system: Secondary | ICD-10-CM | POA: Diagnosis not present

## 2018-01-04 DIAGNOSIS — I6389 Other cerebral infarction: Secondary | ICD-10-CM | POA: Diagnosis not present

## 2018-01-04 DIAGNOSIS — Z87891 Personal history of nicotine dependence: Secondary | ICD-10-CM | POA: Diagnosis not present

## 2018-01-04 DIAGNOSIS — E7800 Pure hypercholesterolemia, unspecified: Secondary | ICD-10-CM | POA: Diagnosis not present

## 2018-01-04 DIAGNOSIS — I5033 Acute on chronic diastolic (congestive) heart failure: Secondary | ICD-10-CM | POA: Diagnosis not present

## 2018-01-04 DIAGNOSIS — I110 Hypertensive heart disease with heart failure: Secondary | ICD-10-CM | POA: Diagnosis present

## 2018-01-04 DIAGNOSIS — R748 Abnormal levels of other serum enzymes: Secondary | ICD-10-CM | POA: Diagnosis not present

## 2018-01-04 DIAGNOSIS — R0902 Hypoxemia: Secondary | ICD-10-CM | POA: Diagnosis not present

## 2018-01-04 DIAGNOSIS — R402441 Other coma, without documented Glasgow coma scale score, or with partial score reported, in the field [EMT or ambulance]: Secondary | ICD-10-CM | POA: Diagnosis not present

## 2018-01-04 DIAGNOSIS — E161 Other hypoglycemia: Secondary | ICD-10-CM | POA: Diagnosis not present

## 2018-01-04 DIAGNOSIS — M2550 Pain in unspecified joint: Secondary | ICD-10-CM | POA: Diagnosis not present

## 2018-01-04 DIAGNOSIS — R278 Other lack of coordination: Secondary | ICD-10-CM | POA: Diagnosis not present

## 2018-01-04 DIAGNOSIS — G4089 Other seizures: Secondary | ICD-10-CM | POA: Diagnosis present

## 2018-01-04 DIAGNOSIS — R569 Unspecified convulsions: Secondary | ICD-10-CM | POA: Diagnosis not present

## 2018-01-04 DIAGNOSIS — Z7401 Bed confinement status: Secondary | ICD-10-CM | POA: Diagnosis not present

## 2018-01-04 DIAGNOSIS — I272 Pulmonary hypertension, unspecified: Secondary | ICD-10-CM | POA: Diagnosis not present

## 2018-01-04 DIAGNOSIS — R29714 NIHSS score 14: Secondary | ICD-10-CM | POA: Diagnosis present

## 2018-01-04 DIAGNOSIS — I63412 Cerebral infarction due to embolism of left middle cerebral artery: Secondary | ICD-10-CM | POA: Diagnosis not present

## 2018-01-04 DIAGNOSIS — Z7901 Long term (current) use of anticoagulants: Secondary | ICD-10-CM | POA: Diagnosis not present

## 2018-01-04 DIAGNOSIS — E049 Nontoxic goiter, unspecified: Secondary | ICD-10-CM | POA: Diagnosis not present

## 2018-01-04 DIAGNOSIS — E162 Hypoglycemia, unspecified: Secondary | ICD-10-CM | POA: Diagnosis not present

## 2018-01-04 DIAGNOSIS — Z823 Family history of stroke: Secondary | ICD-10-CM | POA: Diagnosis not present

## 2018-01-04 DIAGNOSIS — Z8673 Personal history of transient ischemic attack (TIA), and cerebral infarction without residual deficits: Secondary | ICD-10-CM | POA: Diagnosis not present

## 2018-01-04 DIAGNOSIS — Z90710 Acquired absence of both cervix and uterus: Secondary | ICD-10-CM | POA: Diagnosis not present

## 2018-01-04 DIAGNOSIS — Z79899 Other long term (current) drug therapy: Secondary | ICD-10-CM | POA: Diagnosis not present

## 2018-01-04 DIAGNOSIS — R29810 Facial weakness: Secondary | ICD-10-CM | POA: Diagnosis present

## 2018-01-04 DIAGNOSIS — I10 Essential (primary) hypertension: Secondary | ICD-10-CM | POA: Diagnosis not present

## 2018-01-04 DIAGNOSIS — Q8500 Neurofibromatosis, unspecified: Secondary | ICD-10-CM | POA: Diagnosis not present

## 2018-01-04 DIAGNOSIS — G9341 Metabolic encephalopathy: Secondary | ICD-10-CM | POA: Diagnosis present

## 2018-01-04 DIAGNOSIS — E785 Hyperlipidemia, unspecified: Secondary | ICD-10-CM | POA: Diagnosis present

## 2018-01-04 DIAGNOSIS — I481 Persistent atrial fibrillation: Secondary | ICD-10-CM | POA: Diagnosis present

## 2018-01-04 DIAGNOSIS — I6340 Cerebral infarction due to embolism of unspecified cerebral artery: Secondary | ICD-10-CM | POA: Diagnosis not present

## 2018-01-04 DIAGNOSIS — K219 Gastro-esophageal reflux disease without esophagitis: Secondary | ICD-10-CM | POA: Diagnosis present

## 2018-01-04 LAB — BASIC METABOLIC PANEL
ANION GAP: 6 (ref 5–15)
BUN: 9 mg/dL (ref 8–23)
CHLORIDE: 105 mmol/L (ref 98–111)
CO2: 24 mmol/L (ref 22–32)
Calcium: 9.1 mg/dL (ref 8.9–10.3)
Creatinine, Ser: 0.65 mg/dL (ref 0.44–1.00)
GFR calc non Af Amer: >60 mL/min (ref >60)
Glucose, Bld: 102 mg/dL — ABNORMAL HIGH (ref 70–99)
Potassium: 4.9 mmol/L (ref 3.5–5.1)
SODIUM: 135 mmol/L (ref 135–145)

## 2018-01-04 LAB — GLUCOSE, CAPILLARY: GLUCOSE-CAPILLARY: 135 mg/dL — AB (ref 70–99)

## 2018-01-04 LAB — MPO/PR-3 (ANCA) ANTIBODIES
ANCA Proteinase 3: <3.5 U/mL (ref 0.0–3.5)
Myeloperoxidase Abs: <9 U/mL (ref 0.0–9.0)

## 2018-01-04 LAB — ANA W/REFLEX IF POSITIVE: Anti Nuclear Antibody(ANA): NEGATIVE

## 2018-01-04 LAB — RHEUMATOID FACTOR: Rhuematoid fact SerPl-aCnc: <10 IU/mL (ref 0.0–13.9)

## 2018-01-04 MED ORDER — DILTIAZEM HCL ER COATED BEADS 120 MG PO CP24: 120.0000 mg | ORAL_CAPSULE | Freq: Every day | ORAL | Status: DC

## 2018-01-04 MED ORDER — ATORVASTATIN CALCIUM 80 MG PO TABS: 80.0000 mg | ORAL_TABLET | Freq: Every evening | ORAL | Status: AC

## 2018-01-04 MED ORDER — FUROSEMIDE 40 MG PO TABS: 40.0000 mg | ORAL_TABLET | Freq: Two times a day (BID) | ORAL | 3 refills | Status: AC

## 2018-01-04 NOTE — Clinical Social Work Placement (Signed)
Nurse to call report to [phone], Room 1204P   Transport set for 2:30 PM.     CLINICAL SOCIAL WORK PLACEMENT  NOTE  Date:  01/04/2018  Patient Details  Name: Stephanie Simmons MRN: [id number] Date of Birth: [birth year]  Clinical Social Work is seeking post-discharge placement for this patient at the [place] level of care (*CSW will initial, date and re-position this form in  chart as items are completed):  Yes   Patient/family provided with [place] Work Department's list of facilities offering this level of care within the geographic area requested by the patient (or if unable, by the patient's family).  Yes   Patient/family informed of their freedom to choose among providers that offer the needed level of care, that participate in Medicare, Medicaid or managed care program needed by the patient, have an available bed and are willing to accept the patient.  Yes   Patient/family informed of [place]'s ownership interest in [facility] and [facility], as well as of the fact that they are under no obligation to receive care at these facilities.  PASRR submitted to EDS on 01/03/18     PASRR number received on       Existing PASRR number confirmed on       FL2 transmitted to all facilities in geographic area requested by pt/family on 01/03/18     FL2 transmitted to all facilities within larger geographic area on       Patient informed that his/her managed care company has contracts with or will negotiate with certain facilities, including the following:        Yes   Patient/family informed of bed offers received.  Patient chooses bed at [facility]     Physician recommends and patient chooses bed at      Patient to be transferred to [facility] on 01/04/18.  Patient to be transferred to facility by PTAR     Patient family notified on 01/04/18 of transfer.  Name of family member notified:        PHYSICIAN        Additional Comment:    _______________________________________________ Geralynn Ochs, LCSW 01/04/2018, 1:21 PM

## 2018-01-04 NOTE — Clinical Social Work Note (Signed)
Clinical Social Work Assessment  Patient Details  Name: Stephanie Simmons MRN: [id number] Date of Birth: [birth year]  Date of referral:  01/03/18               Reason for consult:  Facility Placement                Permission sought to share information with:  Facility Sport and exercise psychologist, Family Supports Permission granted to share information::  Yes, Verbal Permission Granted  Name::     Production designer, theatre/television/film::  Stephanie Simmons  Relationship::  Sister  Contact Information:     Housing/Transportation Living arrangements for the past 2 months:  Single Family Home Source of Information:  Patient, Siblings Patient Interpreter Needed:  None Criminal Activity/Legal Involvement Pertinent to Current Situation/Hospitalization:  No - Comment as needed Significant Relationships:  Adult Children, Siblings, Other Family Members Lives with:  Self Do you feel safe going back to the place where you live?  Yes Need for family participation in patient care:  No (Coment)  Care giving concerns:  Patient from home and will need short term rehab at discharge.   Social Worker assessment / plan:  CSW alerted by MD that patient and family would like [facility]. CSW sent referral, confirmed bed offer and availability. CSW met with patient and sister at bedside to confirm plan to DC to [facility] when stable.  Employment status:  Retired Forensic scientist:  Medicare PT Recommendations:  [place] / Referral to community resources:  [place]  Patient/Family's Response to care:  Patient and family agreeable to SNF placement.  Patient/Family's Understanding of and Emotional Response to Diagnosis, Current Treatment, and Prognosis:  Patient and family acknowledged that patient will need additional assistance and care at this time, and appreciative of CSW assistance in assuring bed at [facility].  Emotional Assessment Appearance:  Appears stated age Attitude/Demeanor/Rapport:   Engaged Affect (typically observed):  Appropriate, Pleasant Orientation:  Oriented to Self, Oriented to Place, Oriented to  Time, Oriented to Situation Alcohol / Substance use:  Not Applicable Psych involvement (Current and /or in the community):  No (Comment)  Discharge Needs  Concerns to be addressed:  Care Coordination Readmission within the last 30 days:  No Current discharge risk:  Physical Impairment, Dependent with Mobility, Lives alone Barriers to Discharge:  Continued Medical Work up, Stephanie Simmons, Stephanie Simmons 01/04/2018, 10:29 AM

## 2018-01-04 NOTE — Consult Note (Signed)
            [facility] CM Primary Care Navigator  01/04/2018  Stephanie Simmons Mar 20, 1941 [id number]   Martin Majestic to see patient at the bedside to identify possible discharge needs butshe wasalreadydischarge per staff.  Patientwas discharged to skilled nursing facility ([name]) per therapy recommendation.  Per chart review, patient presented with complaints of progressive, worsening weakness to the point where she needs assistance to ambulate. (acute CVA)  Primary care provider's office is listed as providing transition of care (TOC) follow-up.  Patient has discharge instruction to follow-up with primary care provider in 1- 2 weeks and neurology follow-up in 4 weeks.   For additional questions please contact:  Edwena Felty A. Livian Vanderbeck, BSN, RN-BC [facility] PRIMARY CARE Navigator Cell: [phone]

## 2018-01-04 NOTE — Progress Notes (Signed)
Patient being discharged today.  Heart rate improved after reducing diltiazem.  Renal function stable with diuresis.  [facility] will sign off.   Medication Recommendations:  Switch lasix to 40mg  po bid.   Other recommendations (labs, testing, etc):  Check BMP in 1 week. Follow up as an outpatient:  7/18 with Dr. Aundra Dubin.  Tamim Skog C. Oval Linsey, MD, [facility] 01/04/2018 12:28 PM

## 2018-01-04 NOTE — Evaluation (Addendum)
Speech Language Pathology Evaluation Patient Details Name: Stephanie Simmons MRN: [id number] DOB: July 13, 1940 Today's Date: 01/04/2018 Time: 5366-4403 SLP Time Calculation (min) (ACUTE ONLY): 10 min  Problem List:  Patient Active Problem List   Diagnosis Date Noted  . Hyperlipidemia 01/02/2018  . Elevated troponin 01/02/2018  . CVA (cerebral vascular accident) ([place]) 01/01/2018  . Chest pain, atypical 09/23/2014  . Acute on chronic diastolic CHF (congestive heart failure), NYHA class 3 ([place]) 09/23/2014  . Acute diastolic heart failure ([place]) 09/23/2014  . Monocytosis 04/21/2014  . Neurofibromatosis ([place]) 04/21/2014  . Poor oral hygiene 04/21/2014  . Atrial fibrillation ([place]) 04/15/2014  . PSVT (paroxysmal supraventricular tachycardia) ([place]) 09/25/2013  . Syncope 09/10/2013  . Atrial flutter ([place]) 09/10/2013  . HTN (hypertension) 09/10/2013   Past Medical History:  Past Medical History:  Diagnosis Date  . Chest pain    a. 04/2009 Cath: essentially nl cors, EF 65%, mod LVH.  Marland Kitchen CHF (congestive heart failure) ([place])   . CVA (cerebral vascular accident) ([place])   . GERD (gastroesophageal reflux disease)   . Hyperlipidemia   . Hypertension   . Hypothyroidism    does not take medication for this any longer per patient   . LVH (left ventricular hypertrophy)    a. 04/2009 Echo: EF 55-65%, mild conc LVH, no reg wma, Gr 1 DD, mild MR.  . Monocytosis 04/21/2014   Past Surgical History:  Past Surgical History:  Procedure Laterality Date  . ABDOMINAL HYSTERECTOMY    . LOOP RECORDER INSERTION N/A 10/16/2017   Procedure: LOOP RECORDER INSERTION;  Surgeon: Evans Lance, MD;  Location: [place] [facility];  Service: Cardiovascular;  Laterality: N/A;  . MASS EXCISION     removal of mass- pleural    HPI:  Stephanie Parkeris a 77 y.o.femalewith medical history significant ofHTN, HLD,CHF, Afib on eliquis; who presents with complains of weakness over the last 2 weeks. Patient at baseline  able to ambulate without need of assistance. However has complained of progressive worsening non-focal weakness to the point where she needs assistance to ambulate. Difficulty to illicit a history from patient. She complains of headache, chest pain, abdominal pain, shortness of breath, orthopnea and lower extremity swelling. It isn't clear if patient has been taking medications like prescribed. MRI revealed acute nonhemorrhagic RIGHT cerebellar/PICA territory infarct with old small bilateral cerebellar and LEFT basal ganglia infarcts and mild chronic small vessel ischemic changes.   Assessment / Plan / Recommendation Clinical Impression  Pt presents with functional cognitive abilities in this setting. However, would recommend comprehensive cognitive linguistic evaluation at next venue of care to assess ability to take medicine as indicated before discharge home. ST to sign off acutely.     SLP Assessment  SLP Recommendation/Assessment: All further Speech Lanaguage Pathology  needs can be addressed in the next venue of care SLP Visit Diagnosis: Cognitive communication deficit (R41.841)    Follow Up Recommendations  Skilled Nursing facility    Frequency and Duration           SLP Evaluation Cognition  Overall Cognitive Status: Within Functional Limits for tasks assessed Arousal/Alertness: Awake/alert Orientation Level: Oriented X4       Comprehension       Expression Expression Primary Mode of Expression: Verbal Verbal Expression Overall Verbal Expression: Appears within functional limits for tasks assessed Written Expression Dominant Hand: Right   Oral / Motor  Oral Motor/Sensory Function Overall Oral Motor/Sensory Function: Within functional limits Motor Speech Overall Motor Speech: Appears within functional limits for  tasks assessed   GO                    [name] 01/04/2018, 11:40 AM

## 2018-01-04 NOTE — Progress Notes (Signed)
Patient discharging to [facility]. PTAR All belongings sent with patient. Nurse will call report

## 2018-01-04 NOTE — Care Management Important Message (Signed)
Important Message  Patient Details  Name: Stephanie Simmons MRN: [id number] Date of Birth: 1941-01-15   Medicare Important Message Given:  Yes    Orbie Pyo 01/04/2018, 3:26 PM

## 2018-01-04 NOTE — Care Management Note (Signed)
Case Management Note  Patient Details  Name: Stephanie Simmons MRN: [id number] Date of Birth: [birth year]  Subjective/Objective:                    Action/Plan: Pt discharging to [facility] today. CM signing off.   Expected Discharge Date:  01/04/18               Expected Discharge Plan:  Skilled Nursing Facility  In-House Referral:  Clinical Social Work  Discharge planning Services     Post Acute Care Choice:    Choice offered to:     DME Arranged:    DME Agency:     HH Arranged:    [place] Agency:     Status of Service:  Completed, signed off  If discussed at [employer], dates discussed:    Additional Comments:  Pollie Friar, RN 01/04/2018, 3:25 PM

## 2018-01-04 NOTE — Discharge Summary (Signed)
Physician Discharge Summary  Tierany Appleby QMV:[id number] DOB: 08/19/1940 DOA: 01/01/2018  PCP: Leighton Ruff, MD  Admit date: 01/01/2018 Discharge date: 01/04/2018  Admitted From: home Disposition:  SNF  Recommendations for Outpatient Follow-up:  1. Follow up with PCP in 1-2 weeks 2. Repeat BMP within 1 week  Home Health: none Equipment/Devices: none  Discharge Condition: stable CODE STATUS: Full code Diet recommendation: hear thealthy  HPI: Per Dr. Tamala Julian, Stephanie Simmons is a 77 y.o. female with medical history significant of HTN, HLD, CHF, Afib on eliquis; who presents with complains of weakness over the last 2 weeks. Patient at baseline able to ambulate without need of assistance.  However has complained of progressive worsening non-focal weakness to the point where she needs assistance to ambulate. Difficulty to illicit a history from patient.  She complains of headache, chest pain, abdominal pain, shortness of breath, orthopnea and lower extremity swelling. It isn't clear if patient has been taking medications like prescribed.    Hospital Course: Acute CVA -MRI on admission showed acute nonhemorrhagic right cerebellar/PICA territory infarct as well as old small bilateral cerebellar and left basal ganglia infarcts. Neurology was consulted and followed patient while hospitalized. Patient has A. fib which places her at risk for strokes, and has missed at least a few days of Eliquis.  For now continue Eliquis. She underwent a 2D echo just 2 weeks ago, will not repeat.  At that time her EF was 65 to 70%, and moderate LVH.  PA pressure was elevated to 93. CT angiogram was done on 7/9 without hemodynamically significant stenosis. LDL is 94, she is on statin.  Persistent A. fib/acute on chronic diastolic CHF/severe pulmonary hypertension /chest pain on admission / HTN -Plan to be seen in CHF clinic, cardiology consulted currently and following in the hospital.  Appreciate input.   Troponin leak likely due to demand ischemia rather than acute MI.  She underwent a VQ scan which is negative for PE but did show some changes consistent with emphysema. She will need PFTs as an outpatient.  She was diuresed with IV Lasix per cardiology, and was transitioned to Lasix twice daily on discharge.  Recommend repeat a BMP within a week Bradycardia -Decreased dose of Cardizem from 240 to 120, heart rate improved History of hypothyroidism -No longer taking medication, TSH normal 1.98 Hyperlipidemia -Continue statin, increase to 80   Discharge Diagnoses:  Principal Problem:   CVA (cerebral vascular accident) ([place]) Active Problems:   Atrial fibrillation ([place])   Acute on chronic diastolic CHF (congestive heart failure), NYHA class 3 ([place])   Hyperlipidemia   Elevated troponin  Discharge Instructions  Discharge Instructions    Ambulatory referral to Neurology   Complete by:  As directed    Follow up with stroke clinic NP (Jessica Vanschaick or Cecille Rubin, if both not available, consider Dr. Antony Contras, Dr. Bess Harvest, or Dr. Sarina Ill) at [facility] in about 4 weeks.     Allergies as of 01/04/2018   No Known Allergies     Medication List    TAKE these medications   acetaminophen 500 MG tablet Commonly known as:  TYLENOL Take 500 mg by mouth every 6 (six) hours as needed (pain).   atorvastatin 80 MG tablet Commonly known as:  LIPITOR Take 1 tablet (80 mg total) by mouth every evening. What changed:    medication strength  how much to take   bimatoprost 0.01 % Soln Commonly known as:  LUMIGAN Place 1 drop into  both eyes at bedtime.   chlorhexidine 0.12 % solution Commonly known as:  PERIDEX Use as directed 15 mLs in the mouth or throat 2 (two) times daily.   dextromethorphan 30 MG/5ML liquid Commonly known as:  DELSYM Take 60 mg by mouth as needed for cough.   diltiazem 120 MG 24 hr capsule Commonly known as:  CARDIZEM CD Take  1 capsule (120 mg total) by mouth daily. Start taking on:  01/05/2018 What changed:    medication strength  how much to take   dorzolamide-timolol 22.3-6.8 MG/ML ophthalmic solution Commonly known as:  COSOPT Place 1 drop into both eyes 2 (two) times daily.   ELIQUIS 5 MG Tabs tablet Generic drug:  apixaban TAKE 1 TABLET BY MOUTH TWICE A DAY   enalapril 10 MG tablet Commonly known as:  VASOTEC Take 1 1/2 tablets by mouth daily What changed:    how much to take  how to take this  when to take this  additional instructions   FLUoxetine 20 MG capsule Commonly known as:  PROZAC Take 20 mg by mouth daily.   furosemide 40 MG tablet Commonly known as:  LASIX Take 1 tablet (40 mg total) by mouth 2 (two) times daily. What changed:  when to take this   KLOR-CON M20 20 MEQ tablet Generic drug:  potassium chloride SA TAKE 2 TABLETS (40 MEQ TOTAL) BY MOUTH 2 (TWO) TIMES DAILY.   metoprolol succinate 25 MG 24 hr tablet Commonly known as:  TOPROL-XL TAKE 1 TABLET (25 MG TOTAL) BY MOUTH DAILY. TAKE WITH OR IMMEDIATELY FOLLOWING A MEAL.   MURO 128 OP Place 1 drop into both eyes 3 (three) times daily.   NEXIUM 24HR 20 MG Tbec Generic drug:  Esomeprazole Magnesium Take 20 mg by mouth daily.   SYSTANE 0.4-0.3 % Soln Generic drug:  Polyethyl Glycol-Propyl Glycol Place 1 drop into both eyes as needed (dry eyes).   Vitamin D 2000 units tablet Take 2,000 Units by mouth daily.       Contact information for follow-up providers    [facility] Follow up in 4 week(s).   Specialty:  Neurology Why:  stroke clinic, office will call wtih appt date and time Contact information: [address] [place] [phone]           Contact information for after-discharge care    Destination    [name] .   Service:  Skilled Nursing Contact information: [place]  [place] [phone]                  Consultations:  Neurology   Cardiology   Procedures/Studies:   Ct Angio Head W Or Wo Contrast  Result Date: 01/01/2018 CLINICAL DATA:  Follow-up cerebellar infarct. History of hypertension, hyperlipidemia. EXAM: CT ANGIOGRAPHY HEAD AND NECK TECHNIQUE: Multidetector CT imaging of the head and neck was performed using the standard protocol during bolus administration of intravenous contrast. Multiplanar CT image reconstructions and MIPs were obtained to evaluate the vascular anatomy. Carotid stenosis measurements (when applicable) are obtained utilizing NASCET criteria, using the distal internal carotid diameter as the denominator. CONTRAST:  43mL ISOVUE-370 IOPAMIDOL (ISOVUE-370) INJECTION 76% COMPARISON:  MRI and CT HEAD January 01, 2018. FINDINGS: CTA NECK FINDINGS: AORTIC ARCH: Common origin of the innominate and LEFT common carotid artery. LEFT vertebral artery arises directly from the aortic arch. Arch vessel origins are patent. Mild calcific atherosclerosis aortic arch. The origins of the innominate,  left Common carotid artery and subclavian artery are widely patent. RIGHT CAROTID SYSTEM: Common carotid artery is patent. Normal appearance of the carotid bifurcation without hemodynamically significant stenosis by NASCET criteria. Normal appearance of the internal carotid artery. LEFT CAROTID SYSTEM: Common carotid artery is patent. Normal appearance of the carotid bifurcation without hemodynamically significant stenosis by NASCET criteria. Normal appearance of the internal carotid artery. VERTEBRAL ARTERIES:RIGHT vertebral artery is dominant. Normal appearance of the vertebral arteries, widely patent. SKELETON: No acute osseous process though bone windows have not been submitted. OTHER NECK: Soft tissues of the neck are nonacute though, not tailored for evaluation. Thyromegaly with scattered calcifications, no dominant nodule. Hypoplastic mandible with  hardware. UPPER CHEST: Severe centrilobular and paraseptal emphysema. LEFT upper chest wall deformity. LEFT calcified pleural plaque. CTA HEAD FINDINGS: ANTERIOR CIRCULATION: Patent cervical internal carotid arteries, petrous, cavernous and supra clinoid internal carotid arteries. Moderate stenosis RIGHT supraclinoid ICA. Patent anterior communicating artery. Patent anterior and middle cerebral arteries, mild luminal irregularity compatible with atherosclerosis. Long segment moderate stenosis RIGHT M1 segment. No large vessel occlusion, significant stenosis, contrast extravasation or aneurysm. POSTERIOR CIRCULATION: Patent vertebral arteries, vertebrobasilar junction and basilar artery, as well as main branch vessels. Patent RIGHT PICA. Patent posterior cerebral arteries. Robust LEFT posterior communicating artery. No large vessel occlusion, significant stenosis, contrast extravasation or aneurysm. VENOUS SINUSES: Major dural venous sinuses are patent though not tailored for evaluation on this angiographic examination. ANATOMIC VARIANTS: Hypoplastic LEFT P1 segment. DELAYED PHASE: Not performed. MIP images reviewed. IMPRESSION: CTA NECK: 1. No hemodynamically significant stenosis ICA. Patent vertebral arteries. CTA HEAD: 1. No emergent large vessel occlusion or flow-limiting stenosis. 2. Moderate stenosis RIGHT supraclinoid ICA and RIGHT M1 segment. Emphysema (ICD10-J43.9).  Aortic Atherosclerosis (ICD10-I70.0). Electronically Signed   By: Elon Alas M.D.   On: 01/01/2018 22:57   Dg Chest 2 View  Result Date: 01/01/2018 CLINICAL DATA:  Chest pain EXAM: CHEST - 2 VIEW COMPARISON:  07/27/2016 FINDINGS: AP and lateral views of the chest show a hyperexpansion. The cardio pericardial silhouette is enlarged. Left base collapse/consolidation is associated with small left pleural effusion. May be some volume loss in the left hemithorax is well. Staple line again noted at the left apex. Bones are diffusely  demineralized. Thoracolumbar scoliosis again noted. Telemetry leads overlie the chest. IMPRESSION: Retrocardiac left base collapse/consolidation with possible volume loss in the left hemithorax. Tiny left pleural effusion. Electronically Signed   By: Misty Stanley M.D.   On: 01/01/2018 15:06   Ct Head Wo Contrast  Result Date: 01/01/2018 CLINICAL DATA:  Generalized weakness for 3 days. Recent diagnosis of atrial fibrillation. EXAM: CT HEAD WITHOUT CONTRAST TECHNIQUE: Contiguous axial images were obtained from the base of the skull through the vertex without intravenous contrast. COMPARISON:  None. FINDINGS: Brain: No subdural, epidural, or subarachnoid hemorrhage. There is a rounded region of low attenuation in the right cerebellar hemisphere with mild regional mass effect. No associated hemorrhage. The remainder of the cerebellum is normal. The brainstem and basal cisterns are normal. Ventricles and sulci are unremarkable. No acute cortical ischemia or infarct otherwise seen. No midline shift. Vascular: Calcified atherosclerosis in the intracranial carotids. Skull: Normal. Negative for fracture or focal lesion. Sinuses/Orbits: No acute finding. Other: None. IMPRESSION: 1. Rounded region of low attenuation in the right cerebellar hemisphere with mild regional mass effect but no associated bleed is most consistent with an infarct, acute to subacute. 2. No other acute abnormalities. Findings called to Dr. Alvino Chapel. Electronically Signed   By: Shanon Brow  Jimmye Norman III M.D   On: 01/01/2018 15:31   Ct Angio Neck W Or Wo Contrast  Result Date: 01/01/2018 CLINICAL DATA:  Follow-up cerebellar infarct. History of hypertension, hyperlipidemia. EXAM: CT ANGIOGRAPHY HEAD AND NECK TECHNIQUE: Multidetector CT imaging of the head and neck was performed using the standard protocol during bolus administration of intravenous contrast. Multiplanar CT image reconstructions and MIPs were obtained to evaluate the vascular anatomy.  Carotid stenosis measurements (when applicable) are obtained utilizing NASCET criteria, using the distal internal carotid diameter as the denominator. CONTRAST:  55mL ISOVUE-370 IOPAMIDOL (ISOVUE-370) INJECTION 76% COMPARISON:  MRI and CT HEAD January 01, 2018. FINDINGS: CTA NECK FINDINGS: AORTIC ARCH: Common origin of the innominate and LEFT common carotid artery. LEFT vertebral artery arises directly from the aortic arch. Arch vessel origins are patent. Mild calcific atherosclerosis aortic arch. The origins of the innominate, left Common carotid artery and subclavian artery are widely patent. RIGHT CAROTID SYSTEM: Common carotid artery is patent. Normal appearance of the carotid bifurcation without hemodynamically significant stenosis by NASCET criteria. Normal appearance of the internal carotid artery. LEFT CAROTID SYSTEM: Common carotid artery is patent. Normal appearance of the carotid bifurcation without hemodynamically significant stenosis by NASCET criteria. Normal appearance of the internal carotid artery. VERTEBRAL ARTERIES:RIGHT vertebral artery is dominant. Normal appearance of the vertebral arteries, widely patent. SKELETON: No acute osseous process though bone windows have not been submitted. OTHER NECK: Soft tissues of the neck are nonacute though, not tailored for evaluation. Thyromegaly with scattered calcifications, no dominant nodule. Hypoplastic mandible with hardware. UPPER CHEST: Severe centrilobular and paraseptal emphysema. LEFT upper chest wall deformity. LEFT calcified pleural plaque. CTA HEAD FINDINGS: ANTERIOR CIRCULATION: Patent cervical internal carotid arteries, petrous, cavernous and supra clinoid internal carotid arteries. Moderate stenosis RIGHT supraclinoid ICA. Patent anterior communicating artery. Patent anterior and middle cerebral arteries, mild luminal irregularity compatible with atherosclerosis. Long segment moderate stenosis RIGHT M1 segment. No large vessel occlusion,  significant stenosis, contrast extravasation or aneurysm. POSTERIOR CIRCULATION: Patent vertebral arteries, vertebrobasilar junction and basilar artery, as well as main branch vessels. Patent RIGHT PICA. Patent posterior cerebral arteries. Robust LEFT posterior communicating artery. No large vessel occlusion, significant stenosis, contrast extravasation or aneurysm. VENOUS SINUSES: Major dural venous sinuses are patent though not tailored for evaluation on this angiographic examination. ANATOMIC VARIANTS: Hypoplastic LEFT P1 segment. DELAYED PHASE: Not performed. MIP images reviewed. IMPRESSION: CTA NECK: 1. No hemodynamically significant stenosis ICA. Patent vertebral arteries. CTA HEAD: 1. No emergent large vessel occlusion or flow-limiting stenosis. 2. Moderate stenosis RIGHT supraclinoid ICA and RIGHT M1 segment. Emphysema (ICD10-J43.9).  Aortic Atherosclerosis (ICD10-I70.0). Electronically Signed   By: Elon Alas M.D.   On: 01/01/2018 22:57   Mr Brain Wo Contrast  Result Date: 01/01/2018 CLINICAL DATA:  Generalized weakness, LEFT facial droop for a few days. Follow up stroke. History of hypertension, hyperlipidemia. EXAM: MRI HEAD WITHOUT CONTRAST TECHNIQUE: Multiplanar, multiecho pulse sequences of the brain and surrounding structures were obtained without intravenous contrast. COMPARISON:  CT HEAD January 01, 2018 FINDINGS: INTRACRANIAL CONTENTS: Wedge-like reduced diffusion RIGHT inferior cerebellum with low to normalized ADC values and RIGHT FLAIR signal. A few punctate foci reduced diffusion RIGHT mid cerebellum. Regional mass effect without midline shift. Old small bilateral cerebellar infarcts. Chronic microhemorrhage LEFT occipital lobe without susceptibility artifact to suggest acute blood products. Old LEFT basal ganglia lacunar infarct. Prominent basal ganglia and thalami perivascular spaces associated chronic small vessel ischemic changes. Patchy supratentorial and pontine white matter FLAIR  T2 hyperintensities compatible with chronic  small vessel ischemic changes, normal for age. No parenchymal brain volume loss for age. No hydrocephalus. No abnormal extra-axial fluid collections. VASCULAR: Normal major intracranial vascular flow voids present at skull base. SKULL AND UPPER CERVICAL SPINE: No abnormal sellar expansion. No suspicious calvarial bone marrow signal. Craniocervical junction maintained. SINUSES/ORBITS: The mastoid air-cells and included paranasal sinuses are well-aerated.The included ocular globes and orbital contents are non-suspicious. Status post bilateral ocular lens implants. OTHER: None. IMPRESSION: 1. Acute nonhemorrhagic RIGHT cerebellar/PICA territory infarct. 2. Old small bilateral cerebellar and LEFT basal ganglia infarcts. Mild chronic small vessel ischemic changes. Electronically Signed   By: Elon Alas M.D.   On: 01/01/2018 22:13   Nm Pulmonary Perf And Vent  Result Date: 01/02/2018 CLINICAL DATA:  Short of breath. Heart failure. Pulmonary hypertension. EXAM: NUCLEAR MEDICINE VENTILATION - PERFUSION LUNG SCAN TECHNIQUE: Ventilation images were obtained in multiple projections using inhaled aerosol Tc-66m DTPA. Perfusion images were obtained in multiple projections after intravenous injection of Tc-4m-MAA. RADIOPHARMACEUTICALS:  31.7 mCi of Tc-53m DTPA aerosol inhalation and 4.2 mCi Tc80m-MAA IV COMPARISON:  None. FINDINGS: Ventilation: No focal ventilation defect. Small peripheral ventilation defects in LEFT and RIGHT lung. Perfusion: No wedge shaped peripheral perfusion defects to suggest acute pulmonary embolism. IMPRESSION: 1. No evidence of acute pulmonary embolism. 2. Peripheral ventilation defects consistent with COPD/emphysema Electronically Signed   By: Suzy Bouchard M.D.   On: 01/02/2018 15:59     Subjective: - no chest pain, shortness of breath, no abdominal pain, nausea or vomiting.   Discharge Exam: Vitals:   01/04/18 0800 01/04/18 1111  BP:  136/87 131/89  Pulse: 65 77  Resp: 18 19  Temp: 97.9 F (36.6 C) 98.4 F (36.9 C)  SpO2: 98% 98%    General: Pt is alert, awake, not in acute distress Cardiovascular: irregular  Respiratory: CTA bilaterally, no wheezing, no rhonchi Abdominal: Soft, NT, ND, bowel sounds + Extremities: no edema, no cyanosis   The results of significant diagnostics from this hospitalization (including imaging, microbiology, ancillary and laboratory) are listed below for reference.     Microbiology: No results found for this or any previous visit (from the past 240 hour(s)).   Labs: BNP (last 3 results) Recent Labs    01/01/18 1440  BNP 0,539.7*   Basic Metabolic Panel: Recent Labs  Lab 01/01/18 1440 01/02/18 0551 01/03/18 0526 01/04/18 0426  NA 136 137 136 135  K 4.9 3.6 5.0 4.9  CL 104 103 105 105  CO2 21* 23 20* 24  GLUCOSE 85 101* 105* 102*  BUN 16 12 13 9   CREATININE 0.86 0.76 0.80 0.65  CALCIUM 9.2 9.0 9.0 9.1  MG  --  1.7  --   --    Liver Function Tests: Recent Labs  Lab 01/01/18 1440 01/03/18 0526  AST 29 31  ALT 27 30  ALKPHOS 43 41  BILITOT 1.4* 1.0  PROT 5.6* 5.3*  ALBUMIN 3.3* 2.9*   No results for input(s): LIPASE, AMYLASE in the last 168 hours. No results for input(s): AMMONIA in the last 168 hours. CBC: Recent Labs  Lab 01/01/18 1440 01/02/18 0551  WBC 7.5 7.5  NEUTROABS  --  4.7  HGB 13.8 13.4  HCT 45.2 44.3  MCV 81.6 81.7  PLT 245 252   Cardiac Enzymes: Recent Labs  Lab 01/01/18 1440 01/01/18 1824 01/02/18 0551  TROPONINI 0.13* 0.11* 0.10*   BNP: Invalid input(s): POCBNP CBG: Recent Labs  Lab 01/03/18 0618 01/03/18 1125 01/03/18 1555 01/03/18 2145 01/04/18 1108  GLUCAP 104* 118* 111* 117* 135*   D-Dimer No results for input(s): DDIMER in the last 72 hours. Hgb A1c Recent Labs    01/02/18 0551  HGBA1C 6.2*   Lipid Profile Recent Labs    01/02/18 0551  CHOL 145  HDL 39*  LDLCALC 94  TRIG 59  CHOLHDL 3.7   Thyroid  function studies Recent Labs    01/01/18 1824  TSH 1.980   Anemia work up No results for input(s): VITAMINB12, FOLATE, FERRITIN, TIBC, IRON, RETICCTPCT in the last 72 hours. Urinalysis    Component Value Date/Time   COLORURINE YELLOW 04/26/2009 1213   APPEARANCEUR CLEAR 04/26/2009 1213   LABSPEC 1.012 04/26/2009 1213   PHURINE 7.0 04/26/2009 1213   GLUCOSEU NEGATIVE 04/26/2009 1213   HGBUR MODERATE (A) 04/26/2009 1213   BILIRUBINUR NEGATIVE 04/26/2009 1213   KETONESUR NEGATIVE 04/26/2009 1213   PROTEINUR NEGATIVE 04/26/2009 1213   UROBILINOGEN 0.2 04/26/2009 1213   NITRITE NEGATIVE 04/26/2009 1213   LEUKOCYTESUR NEGATIVE 04/26/2009 1213   Sepsis Labs Invalid input(s): PROCALCITONIN,  WBC,  LACTICIDVEN   Time coordinating discharge: 45 minutes  SIGNED:  Marzetta Board, MD  [facility] 01/04/2018, 12:36 PM Pager [phone]  If 7PM-7AM, please contact night-coverage [email or web address] Password [email or web address]

## 2018-01-06 ENCOUNTER — Emergency Department (HOSPITAL_COMMUNITY): Payer: Medicare Other

## 2018-01-06 ENCOUNTER — Encounter (HOSPITAL_COMMUNITY): Payer: Self-pay

## 2018-01-06 ENCOUNTER — Other Ambulatory Visit: Payer: Self-pay

## 2018-01-06 ENCOUNTER — Emergency Department (HOSPITAL_COMMUNITY): Payer: Medicare Other | Admitting: Critical Care Medicine

## 2018-01-06 ENCOUNTER — Observation Stay (HOSPITAL_COMMUNITY): Payer: Medicare Other

## 2018-01-06 ENCOUNTER — Inpatient Hospital Stay (HOSPITAL_COMMUNITY)
Admission: EM | Admit: 2018-01-06 | Discharge: 2018-01-10 | DRG: 064 | Disposition: A | Discharge status: Skilled Nursing Facility | Payer: Medicare Other | Source: Skilled Nursing Facility | Attending: Internal Medicine | Admitting: Internal Medicine

## 2018-01-06 ENCOUNTER — Encounter (HOSPITAL_COMMUNITY)
Admission: EM | Disposition: A | Discharge status: Skilled Nursing Facility | Payer: Self-pay | Source: Skilled Nursing Facility | Attending: Internal Medicine

## 2018-01-06 DIAGNOSIS — I110 Hypertensive heart disease with heart failure: Secondary | ICD-10-CM | POA: Diagnosis present

## 2018-01-06 DIAGNOSIS — I6340 Cerebral infarction due to embolism of unspecified cerebral artery: Secondary | ICD-10-CM | POA: Diagnosis present

## 2018-01-06 DIAGNOSIS — J438 Other emphysema: Secondary | ICD-10-CM | POA: Diagnosis present

## 2018-01-06 DIAGNOSIS — Z79899 Other long term (current) drug therapy: Secondary | ICD-10-CM

## 2018-01-06 DIAGNOSIS — I10 Essential (primary) hypertension: Secondary | ICD-10-CM | POA: Diagnosis present

## 2018-01-06 DIAGNOSIS — G4089 Other seizures: Secondary | ICD-10-CM | POA: Diagnosis present

## 2018-01-06 DIAGNOSIS — R29714 NIHSS score 14: Secondary | ICD-10-CM | POA: Diagnosis present

## 2018-01-06 DIAGNOSIS — R0902 Hypoxemia: Secondary | ICD-10-CM | POA: Diagnosis not present

## 2018-01-06 DIAGNOSIS — R29810 Facial weakness: Secondary | ICD-10-CM | POA: Diagnosis present

## 2018-01-06 DIAGNOSIS — I63412 Cerebral infarction due to embolism of left middle cerebral artery: Principal | ICD-10-CM | POA: Diagnosis present

## 2018-01-06 DIAGNOSIS — G9341 Metabolic encephalopathy: Secondary | ICD-10-CM | POA: Diagnosis not present

## 2018-01-06 DIAGNOSIS — E785 Hyperlipidemia, unspecified: Secondary | ICD-10-CM | POA: Diagnosis present

## 2018-01-06 DIAGNOSIS — Z66 Do not resuscitate: Secondary | ICD-10-CM | POA: Diagnosis present

## 2018-01-06 DIAGNOSIS — Z87891 Personal history of nicotine dependence: Secondary | ICD-10-CM

## 2018-01-06 DIAGNOSIS — R4701 Aphasia: Secondary | ICD-10-CM | POA: Diagnosis present

## 2018-01-06 DIAGNOSIS — I5033 Acute on chronic diastolic (congestive) heart failure: Secondary | ICD-10-CM | POA: Diagnosis not present

## 2018-01-06 DIAGNOSIS — E875 Hyperkalemia: Secondary | ICD-10-CM | POA: Diagnosis present

## 2018-01-06 DIAGNOSIS — I639 Cerebral infarction, unspecified: Secondary | ICD-10-CM | POA: Diagnosis present

## 2018-01-06 DIAGNOSIS — Z7901 Long term (current) use of anticoagulants: Secondary | ICD-10-CM

## 2018-01-06 DIAGNOSIS — R414 Neurologic neglect syndrome: Secondary | ICD-10-CM | POA: Diagnosis present

## 2018-01-06 DIAGNOSIS — R569 Unspecified convulsions: Secondary | ICD-10-CM

## 2018-01-06 DIAGNOSIS — E049 Nontoxic goiter, unspecified: Secondary | ICD-10-CM | POA: Diagnosis not present

## 2018-01-06 DIAGNOSIS — Z823 Family history of stroke: Secondary | ICD-10-CM

## 2018-01-06 DIAGNOSIS — R627 Adult failure to thrive: Secondary | ICD-10-CM | POA: Diagnosis present

## 2018-01-06 DIAGNOSIS — Z90710 Acquired absence of both cervix and uterus: Secondary | ICD-10-CM

## 2018-01-06 DIAGNOSIS — E039 Hypothyroidism, unspecified: Secondary | ICD-10-CM | POA: Diagnosis present

## 2018-01-06 DIAGNOSIS — J432 Centrilobular emphysema: Secondary | ICD-10-CM | POA: Diagnosis present

## 2018-01-06 DIAGNOSIS — K219 Gastro-esophageal reflux disease without esophagitis: Secondary | ICD-10-CM | POA: Diagnosis present

## 2018-01-06 DIAGNOSIS — I6389 Other cerebral infarction: Secondary | ICD-10-CM | POA: Diagnosis not present

## 2018-01-06 DIAGNOSIS — I2721 Secondary pulmonary arterial hypertension: Secondary | ICD-10-CM | POA: Diagnosis present

## 2018-01-06 DIAGNOSIS — Z8249 Family history of ischemic heart disease and other diseases of the circulatory system: Secondary | ICD-10-CM

## 2018-01-06 DIAGNOSIS — I4891 Unspecified atrial fibrillation: Secondary | ICD-10-CM | POA: Diagnosis present

## 2018-01-06 DIAGNOSIS — I481 Persistent atrial fibrillation: Secondary | ICD-10-CM | POA: Diagnosis not present

## 2018-01-06 DIAGNOSIS — Z8673 Personal history of transient ischemic attack (TIA), and cerebral infarction without residual deficits: Secondary | ICD-10-CM

## 2018-01-06 DIAGNOSIS — Q8500 Neurofibromatosis, unspecified: Secondary | ICD-10-CM | POA: Diagnosis present

## 2018-01-06 LAB — COMPREHENSIVE METABOLIC PANEL
ALT: 32 U/L (ref 0–44)
AST: 24 U/L (ref 15–41)
Albumin: 3.1 g/dL — ABNORMAL LOW (ref 3.5–5.0)
Alkaline Phosphatase: 64 U/L (ref 38–126)
Anion gap: 8 (ref 5–15)
BUN: 8 mg/dL (ref 8–23)
CO2: 24 mmol/L (ref 22–32)
Calcium: 9.3 mg/dL (ref 8.9–10.3)
Chloride: 100 mmol/L (ref 98–111)
Creatinine, Ser: 0.73 mg/dL (ref 0.44–1.00)
GFR calc Af Amer: >60 mL/min (ref >60)
GFR calc non Af Amer: >60 mL/min (ref >60)
Glucose, Bld: 112 mg/dL — ABNORMAL HIGH (ref 70–99)
Potassium: 4.6 mmol/L (ref 3.5–5.1)
Sodium: 132 mmol/L — ABNORMAL LOW (ref 135–145)
Total Bilirubin: 0.8 mg/dL (ref 0.3–1.2)
Total Protein: 6.2 g/dL — ABNORMAL LOW (ref 6.5–8.1)

## 2018-01-06 LAB — CBG MONITORING, ED: Glucose-Capillary: 106 mg/dL — ABNORMAL HIGH (ref 70–99)

## 2018-01-06 LAB — I-STAT CHEM 8, ED
BUN: 9 mg/dL (ref 8–23)
Calcium, Ion: 1.2 mmol/L (ref 1.15–1.40)
Chloride: 100 mmol/L (ref 98–111)
Creatinine, Ser: 0.6 mg/dL (ref 0.44–1.00)
Glucose, Bld: 111 mg/dL — ABNORMAL HIGH (ref 70–99)
HCT: 54 % — ABNORMAL HIGH (ref 36.0–46.0)
Hemoglobin: 18.4 g/dL — ABNORMAL HIGH (ref 12.0–15.0)
Potassium: 4.9 mmol/L (ref 3.5–5.1)
Sodium: 134 mmol/L — ABNORMAL LOW (ref 135–145)
TCO2: 26 mmol/L (ref 22–32)

## 2018-01-06 LAB — DIFFERENTIAL
Abs Immature Granulocytes: 0.1 10*3/uL (ref 0.0–0.1)
Basophils Absolute: 0.1 10*3/uL (ref 0.0–0.1)
Basophils Relative: 1 %
Eosinophils Absolute: 0.1 10*3/uL (ref 0.0–0.7)
Eosinophils Relative: 1 %
Immature Granulocytes: 1 %
Lymphocytes Relative: 13 %
Lymphs Abs: 1.3 10*3/uL (ref 0.7–4.0)
Monocytes Absolute: 1.5 10*3/uL — ABNORMAL HIGH (ref 0.1–1.0)
Monocytes Relative: 14 %
Neutro Abs: 7.4 10*3/uL (ref 1.7–7.7)
Neutrophils Relative %: 70 %

## 2018-01-06 LAB — CBC
HCT: 50.8 % — ABNORMAL HIGH (ref 36.0–46.0)
Hemoglobin: 15.6 g/dL — ABNORMAL HIGH (ref 12.0–15.0)
MCH: 25.3 pg — ABNORMAL LOW (ref 26.0–34.0)
MCHC: 30.7 g/dL (ref 30.0–36.0)
MCV: 82.3 fL (ref 78.0–100.0)
Platelets: 345 10*3/uL (ref 150–400)
RBC: 6.17 MIL/uL — ABNORMAL HIGH (ref 3.87–5.11)
RDW: 18.7 % — ABNORMAL HIGH (ref 11.5–15.5)
WBC: 10.5 10*3/uL (ref 4.0–10.5)

## 2018-01-06 LAB — PROTIME-INR
INR: 1.81
Prothrombin Time: 20.8 seconds — ABNORMAL HIGH (ref 11.4–15.2)

## 2018-01-06 LAB — APTT: aPTT: 47 seconds — ABNORMAL HIGH (ref 24–36)

## 2018-01-06 LAB — I-STAT TROPONIN, ED: Troponin i, poc: 0.01 ng/mL (ref 0.00–0.08)

## 2018-01-06 SURGERY — IR WITH ANESTHESIA
Anesthesia: Choice

## 2018-01-06 MED ORDER — VITAMIN D 1000 UNITS PO TABS
2000.0000 [IU] | ORAL_TABLET | Freq: Every day | ORAL | Status: DC
  Administered 2018-01-08 – 2018-01-10 (×3): 2000 [IU] via ORAL
  Filled 2018-01-06 (×6): qty 2

## 2018-01-06 MED ORDER — FLUOXETINE HCL 20 MG PO CAPS
20.0000 mg | ORAL_CAPSULE | Freq: Every day | ORAL | Status: DC
  Administered 2018-01-08 – 2018-01-10 (×3): 20 mg via ORAL
  Filled 2018-01-06 (×3): qty 1

## 2018-01-06 MED ORDER — DORZOLAMIDE HCL-TIMOLOL MAL 2-0.5 % OP SOLN
1.0000 [drp] | Freq: Two times a day (BID) | OPHTHALMIC | Status: DC
  Administered 2018-01-06 – 2018-01-10 (×8): 1 [drp] via OPHTHALMIC
  Filled 2018-01-06: qty 10

## 2018-01-06 MED ORDER — LATANOPROST 0.005 % OP SOLN
1.0000 [drp] | Freq: Every day | OPHTHALMIC | Status: DC
  Administered 2018-01-06 – 2018-01-09 (×4): 1 [drp] via OPHTHALMIC
  Filled 2018-01-06: qty 2.5

## 2018-01-06 MED ORDER — ATORVASTATIN CALCIUM 80 MG PO TABS
80.0000 mg | ORAL_TABLET | Freq: Every evening | ORAL | Status: DC
  Administered 2018-01-07 – 2018-01-09 (×3): 80 mg via ORAL
  Filled 2018-01-06: qty 4
  Filled 2018-01-06 (×2): qty 1
  Filled 2018-01-06: qty 4
  Filled 2018-01-06: qty 1

## 2018-01-06 MED ORDER — ESOMEPRAZOLE MAGNESIUM 20 MG PO TBEC: 20.0000 mg | DELAYED_RELEASE_TABLET | Freq: Every day | ORAL | Status: DC

## 2018-01-06 MED ORDER — EPTIFIBATIDE 20 MG/10ML IV SOLN
INTRAVENOUS | Status: AC
  Filled 2018-01-06: qty 10

## 2018-01-06 MED ORDER — SODIUM CHLORIDE (HYPERTONIC) 5 % OP SOLN: 2.0000 [drp] | OPHTHALMIC | Status: DC | PRN

## 2018-01-06 MED ORDER — SENNOSIDES-DOCUSATE SODIUM 8.6-50 MG PO TABS
1.0000 | ORAL_TABLET | Freq: Every evening | ORAL | Status: DC | PRN
Start: 2018-01-06 — End: 2018-01-10

## 2018-01-06 MED ORDER — DILTIAZEM HCL ER COATED BEADS 120 MG PO CP24: 120.0000 mg | ORAL_CAPSULE | Freq: Every day | ORAL | Status: DC

## 2018-01-06 MED ORDER — TICAGRELOR 90 MG PO TABS
ORAL_TABLET | ORAL | Status: AC
  Filled 2018-01-06: qty 2

## 2018-01-06 MED ORDER — METOPROLOL SUCCINATE ER 25 MG PO TB24
25.0000 mg | ORAL_TABLET | Freq: Every day | ORAL | Status: DC
  Administered 2018-01-08 – 2018-01-10 (×3): 25 mg via ORAL
  Filled 2018-01-06 (×3): qty 1

## 2018-01-06 MED ORDER — APIXABAN 5 MG PO TABS
5.0000 mg | ORAL_TABLET | Freq: Two times a day (BID) | ORAL | Status: DC
  Administered 2018-01-07 – 2018-01-10 (×6): 5 mg via ORAL
  Filled 2018-01-06 (×8): qty 1

## 2018-01-06 MED ORDER — ACETAMINOPHEN 325 MG PO TABS
650.0000 mg | ORAL_TABLET | ORAL | Status: DC | PRN
  Filled 2018-01-06: qty 2

## 2018-01-06 MED ORDER — IOPAMIDOL (ISOVUE-370) INJECTION 76%
50.0000 mL | Freq: Once | INTRAVENOUS | Status: AC | PRN
  Administered 2018-01-06: 50 mL via INTRAVENOUS

## 2018-01-06 MED ORDER — STROKE: EARLY STAGES OF RECOVERY BOOK
Freq: Once | Status: AC
  Administered 2018-01-06: 19:00:00
  Filled 2018-01-06: qty 1

## 2018-01-06 MED ORDER — CLOPIDOGREL BISULFATE 300 MG PO TABS
ORAL_TABLET | ORAL | Status: AC
  Filled 2018-01-06: qty 1

## 2018-01-06 MED ORDER — FUROSEMIDE 20 MG PO TABS: 40.0000 mg | ORAL_TABLET | Freq: Two times a day (BID) | ORAL | Status: DC

## 2018-01-06 MED ORDER — PANTOPRAZOLE SODIUM 40 MG PO TBEC
40.0000 mg | DELAYED_RELEASE_TABLET | Freq: Every day | ORAL | Status: DC
  Administered 2018-01-08 – 2018-01-10 (×3): 40 mg via ORAL
  Filled 2018-01-06 (×3): qty 1

## 2018-01-06 MED ORDER — ENALAPRIL MALEATE 5 MG PO TABS: 15.0000 mg | ORAL_TABLET | Freq: Every day | ORAL | Status: DC

## 2018-01-06 MED ORDER — TIROFIBAN HCL IN NACL 5-0.9 MG/100ML-% IV SOLN
INTRAVENOUS | Status: AC
  Filled 2018-01-06: qty 100

## 2018-01-06 MED ORDER — POLYETHYL GLYCOL-PROPYL GLYCOL 0.4-0.3 % OP SOLN: 1.0000 [drp] | OPHTHALMIC | Status: DC | PRN

## 2018-01-06 MED ORDER — CHLORHEXIDINE GLUCONATE 0.12 % MT SOLN
15.0000 mL | Freq: Two times a day (BID) | OROMUCOSAL | Status: DC
  Administered 2018-01-06 – 2018-01-10 (×7): 15 mL via OROMUCOSAL
  Filled 2018-01-06 (×7): qty 15

## 2018-01-06 MED ORDER — ACETAMINOPHEN 160 MG/5ML PO SOLN: 650.0000 mg | ORAL | Status: DC | PRN

## 2018-01-06 MED ORDER — ACETAMINOPHEN 650 MG RE SUPP: 650.0000 mg | RECTAL | Status: DC | PRN

## 2018-01-06 MED ORDER — CEFAZOLIN SODIUM-DEXTROSE 2-4 GM/100ML-% IV SOLN
INTRAVENOUS | Status: AC
  Filled 2018-01-06: qty 100

## 2018-01-06 MED ORDER — HYPROMELLOSE (GONIOSCOPIC) 2.5 % OP SOLN: 1.0000 [drp] | OPHTHALMIC | Status: DC | PRN

## 2018-01-06 MED ORDER — POTASSIUM CHLORIDE CRYS ER 20 MEQ PO TBCR
40.0000 meq | EXTENDED_RELEASE_TABLET | Freq: Two times a day (BID) | ORAL | Status: DC
  Administered 2018-01-07: 40 meq via ORAL
  Filled 2018-01-06 (×2): qty 2

## 2018-01-06 MED ORDER — LIDOCAINE HCL 1 % IJ SOLN
INTRAMUSCULAR | Status: AC
  Filled 2018-01-06: qty 20

## 2018-01-06 MED ORDER — IOPAMIDOL (ISOVUE-370) INJECTION 76%
INTRAVENOUS | Status: AC
  Filled 2018-01-06: qty 50

## 2018-01-06 MED ORDER — STROKE: EARLY STAGES OF RECOVERY BOOK
Freq: Once | Status: DC
  Filled 2018-01-06: qty 1

## 2018-01-06 MED ORDER — SODIUM CHLORIDE 0.9 % IV SOLN
INTRAVENOUS | Status: DC
  Administered 2018-01-06: 18:00:00 via INTRAVENOUS

## 2018-01-06 MED ORDER — NITROGLYCERIN 1 MG/10 ML FOR IR/CATH LAB
INTRA_ARTERIAL | Status: DC
Start: 2018-01-06 — End: 2018-01-06
  Filled 2018-01-06: qty 10

## 2018-01-06 MED ORDER — ASPIRIN 325 MG PO TABS
ORAL_TABLET | ORAL | Status: AC
  Filled 2018-01-06: qty 1

## 2018-01-06 NOTE — Procedures (Addendum)
History: 77 yo  Being evaluated for aphasia  Sedation: None  Technique: This is a 21 channel routine scalp EEG performed at the bedside with bipolar and monopolar montages arranged in accordance to the international 10/20 system of electrode placement. One channel was dedicated to EKG recording.    Background: The study is compromised by persistent muscle artifact over the left fronto-temporal leads, but during periods of quiescence, the background appears relatively symmetric.   The background consists predominsnatly of intermixed alpha and beta activities. There is a well defined posterior dominant rhythm of 8 Hz that attenuates with eye opening. Sleep is not recorded. There is mild generalized irregular theta activity.   Photic stimulation: Physiologic driving is not performed  EEG Abnormalities: 1) Mild generalized irregular slow activity.   Clinical Interpretation: This EEG is  in      Consistent with a mild generalized non-specific cerebral dysfunction(encephalopathy). There was no seizure or seizure predisposition recorded on this study. Please note that a normal EEG does not preclude the possibility of epilepsy.   Stephanie Rack, MD [facility] [phone]  If 7pm- 7am, please page neurology on call as listed in [place].

## 2018-01-06 NOTE — H&P (Signed)
History and Physical    Stephanie Simmons RJJ:[id number] DOB: Jun 17, 1941 DOA: 01/06/2018  PCP: Leighton Ruff, MD   Patient coming from: home  I have personally briefly reviewed patient's old medical records in [place]  Chief Complaint: Acute stroke  HPI: Stephanie Simmons is a 77 y.o. female with medical history significant of hypertension, hyperlipidemia, CHF, atrial fibrillation anticoagulated on Eliquis discharged on Friday after a nonhemorrhagic right sella Beller/PICA territory infarction.  Neurology had followed patient while she was hospitalized.  And her Eliquis was continued.  Apparently prior to admission she had missed a few days of Eliquis.  She presented to our emergency department today with a aphasia a left gaze preference and not communicating with her sister.  This was noted at 10:20 AM.  EMS was activated.  She was last seen at baseline at 9:00 this morning when medication including Eliquis was given.  On route to [facility] the patient had a right facial twitching that lasted about 45 seconds and then she had a right facial droop and mild right arm drift.  On arrival her right facial droop and right arm drift were resolved.  She still has a left facial droop which resolved in from her stroke last week.  She still had a left gaze preference and aphasia when she was seen by Dr. Beatriz Chancellor from neurology but when I saw the patient she was moving her head to the left and to the right in fact turned her head to the right to see me and she was speaking although fluidly it was not contextually appropriate.   Review of Systems: As per HPI otherwise all other systems reviewed and  negative.    Past Medical History:  Diagnosis Date  . Chest pain    a. 04/2009 Cath: essentially nl cors, EF 65%, mod LVH.  Marland Kitchen CHF (congestive heart failure) ([place])   . CVA (cerebral vascular accident) ([place])   . GERD (gastroesophageal reflux disease)   . Hyperlipidemia   . Hypertension   .  Hypothyroidism    does not take medication for this any longer per patient   . LVH (left ventricular hypertrophy)    a. 04/2009 Echo: EF 55-65%, mild conc LVH, no reg wma, Gr 1 DD, mild MR.  . Monocytosis 04/21/2014    Past Surgical History:  Procedure Laterality Date  . ABDOMINAL HYSTERECTOMY    . LOOP RECORDER INSERTION N/A 10/16/2017   Procedure: LOOP RECORDER INSERTION;  Surgeon: Evans Lance, MD;  Location: [facility];  Service: Cardiovascular;  Laterality: N/A;  . MASS EXCISION     removal of mass- pleural     Social History   Social History Narrative   Lives in [place] by herself.     reports that she quit smoking about 25 years ago. Her smoking use included cigarettes. She has never used smokeless tobacco. She reports that she does not drink alcohol or use drugs.  No Known Allergies  Family History  Problem Relation Age of Onset  . Other Mother        s/p PPM, died @ 42  . Heart attack Father        died @ 62.  . Other Father        enlarged heart   . Stroke Sister     Prior to Admission medications   Medication Sig Start Date End Date Taking? Authorizing Provider  acetaminophen (TYLENOL) 500 MG tablet Take 500 mg by mouth every 6 (six) hours  as needed (pain).   Yes [provider]  atorvastatin (LIPITOR) 80 MG tablet Take 1 tablet (80 mg total) by mouth every evening. 01/04/18  Yes Gherghe, Costin M, MD  bimatoprost (LUMIGAN) 0.01 % SOLN Place 1 drop into both eyes at bedtime.   Yes [provider]  chlorhexidine (PERIDEX) 0.12 % solution Use as directed 15 mLs in the mouth or throat 2 (two) times daily.   Yes [provider]  Cholecalciferol (VITAMIN D) 2000 units tablet Take 2,000 Units by mouth daily.   Yes [provider]  dextromethorphan (DELSYM) 30 MG/5ML liquid Take 60 mg by mouth as needed for cough.   Yes [provider]  diltiazem (CARDIZEM CD) 120 MG 24 hr capsule Take 1 capsule (120 mg total) by mouth  daily. 01/05/18  Yes Gherghe, Vella Redhead, MD  dorzolamide-timolol (COSOPT) 22.3-6.8 MG/ML ophthalmic solution Place 1 drop into both eyes 2 (two) times daily.  08/28/17  Yes [provider]  ELIQUIS 5 MG TABS tablet TAKE 1 TABLET BY MOUTH TWICE A DAY 12/25/17  Yes Evans Lance, MD  enalapril (VASOTEC) 10 MG tablet Take 1 1/2 tablets by mouth daily Patient taking differently: Take 15 mg by mouth daily.  10/01/17  Yes Evans Lance, MD  Esomeprazole Magnesium (NEXIUM 24HR) 20 MG TBEC Take 20 mg by mouth daily.    Yes [provider]  FLUoxetine (PROZAC) 20 MG capsule Take 20 mg by mouth daily. 12/26/17  Yes [provider]  furosemide (LASIX) 40 MG tablet Take 1 tablet (40 mg total) by mouth 2 (two) times daily. 01/04/18  Yes Gherghe, Costin M, MD  KLOR-CON M20 20 MEQ tablet TAKE 2 TABLETS (40 MEQ TOTAL) BY MOUTH 2 (TWO) TIMES DAILY. 10/01/17  Yes Evans Lance, MD  metoprolol succinate (TOPROL-XL) 25 MG 24 hr tablet TAKE 1 TABLET (25 MG TOTAL) BY MOUTH DAILY. TAKE WITH OR IMMEDIATELY FOLLOWING A MEAL. 10/01/17  Yes Evans Lance, MD  Polyethyl Glycol-Propyl Glycol (SYSTANE) 0.4-0.3 % SOLN Place 1 drop into both eyes as needed (dry eyes).    Yes [provider]  Sodium Chloride, Hypertonic, (MURO 128 OP) Place 1 drop into both eyes 3 (three) times daily.    Yes [provider]    Physical Exam:  Constitutional: Chronically ill and cachectic appearing Vitals:   01/06/18 1545 01/06/18 1600 01/06/18 1615 01/06/18 1731  BP: (!) 142/103 (!) 132/96 (!) 147/106 (!) 136/99  Pulse:    82  Resp: 12 18 14 19   Temp:    97.6 F (36.4 C)  TempSrc:    Oral  SpO2:  92%  (!) 73%  Weight:      Height:       Eyes: PERRL, lids and conjunctivae normal ENMT: Mucous membranes are moist. Posterior pharynx clear of any exudate or lesions.Normal dentition.  Neck: normal, supple, no masses, no thyromegaly Respiratory: clear to auscultation bilaterally, no wheezing, no  crackles. Normal respiratory effort. No accessory muscle use.  Cardiovascular: Irregularly irregular rate and rhythm, no murmurs / rubs / gallops. No extremity edema. 2+ pedal pulses. No carotid bruits.  Abdomen: no tenderness, no masses palpated. No hepatosplenomegaly. Bowel sounds positive.  Musculoskeletal: no clubbing / cyanosis. No joint deformity upper and lower extremities. Good ROM, no contractures. Normal muscle tone.  Skin: no rashes, lesions, ulcers. No induration Neurologic: Moves her head to both sides moves eyes.  Moves all extremities but has significant weakness of.  On the left Psychiatric:  Normal judgment and insight. Alert and oriented x 3. Normal mood.    Labs on Admission: I have personally reviewed following labs and imaging studies  CBC: Recent Labs  Lab 01/01/18 1440 01/02/18 0551 01/06/18 1339 01/06/18 1353  WBC 7.5 7.5 10.5  --   NEUTROABS  --  4.7 7.4  --   HGB 13.8 13.4 15.6* 18.4*  HCT 45.2 44.3 50.8* 54.0*  MCV 81.6 81.7 82.3  --   PLT 245 252 345  --    Basic Metabolic Panel: Recent Labs  Lab 01/01/18 1440 01/02/18 0551 01/03/18 0526 01/04/18 0426 01/06/18 1339 01/06/18 1353  NA 136 137 136 135 132* 134*  K 4.9 3.6 5.0 4.9 4.6 4.9  CL 104 103 105 105 100 100  CO2 21* 23 20* 24 24  --   GLUCOSE 85 101* 105* 102* 112* 111*  BUN 16 12 13 9 8 9   CREATININE 0.86 0.76 0.80 0.65 0.73 0.60  CALCIUM 9.2 9.0 9.0 9.1 9.3  --   MG  --  1.7  --   --   --   --    GFR: Estimated Creatinine Clearance: 46.6 mL/min (by C-G formula based on SCr of 0.6 mg/dL). Liver Function Tests: Recent Labs  Lab 01/01/18 1440 01/03/18 0526 01/06/18 1339  AST 29 31 24   ALT 27 30 32  ALKPHOS 43 41 64  BILITOT 1.4* 1.0 0.8  PROT 5.6* 5.3* 6.2*  ALBUMIN 3.3* 2.9* 3.1*   Coagulation Profile: Recent Labs  Lab 01/01/18 1440 01/06/18 1339  INR 1.55 1.81   Cardiac Enzymes: Recent Labs  Lab 01/01/18 1440 01/01/18 1824 01/02/18 0551  TROPONINI 0.13* 0.11*  0.10*   CBG: Recent Labs  Lab 01/03/18 1125 01/03/18 1555 01/03/18 2145 01/04/18 1108 01/06/18 1342  GLUCAP 118* 111* 117* 135* 106*   Urine analysis:    Component Value Date/Time   COLORURINE YELLOW 04/26/2009 1213   APPEARANCEUR CLEAR 04/26/2009 1213   LABSPEC 1.012 04/26/2009 1213   PHURINE 7.0 04/26/2009 1213   GLUCOSEU NEGATIVE 04/26/2009 1213   HGBUR MODERATE (A) 04/26/2009 1213   BILIRUBINUR NEGATIVE 04/26/2009 1213   KETONESUR NEGATIVE 04/26/2009 1213   PROTEINUR NEGATIVE 04/26/2009 1213   UROBILINOGEN 0.2 04/26/2009 1213   NITRITE NEGATIVE 04/26/2009 1213   LEUKOCYTESUR NEGATIVE 04/26/2009 1213    Radiological Exams on Admission: Ct Angio Head W Or Wo Contrast  Result Date: 01/06/2018 CLINICAL DATA:  Stroke.  Aphasia and right facial droop EXAM: CT ANGIOGRAPHY HEAD AND NECK TECHNIQUE: Multidetector CT imaging of the head and neck was performed using the standard protocol during bolus administration of intravenous contrast. Multiplanar CT image reconstructions and MIPs were obtained to evaluate the vascular anatomy. Carotid stenosis measurements (when applicable) are obtained utilizing NASCET criteria, using the distal internal carotid diameter as the denominator. CONTRAST:  22mL ISOVUE-370 IOPAMIDOL (ISOVUE-370) INJECTION 76% COMPARISON:  CT head 01/06/2018 FINDINGS: CTA NECK FINDINGS Aortic arch: Minimal atherosclerotic disease aortic arch. Proximal great vessels widely patent. Left vertebral artery origin from the arch. Right carotid system: Normal right carotid. Negative for carotid stenosis or atherosclerotic disease. Left carotid system: Normal left carotid without stenosis or atherosclerotic disease Vertebral arteries: Both vertebral arteries widely patent without stenosis or atherosclerotic disease Skeleton: Negative Other neck: Negative for mass or adenopathy. Thyroid goiter with calcifications and nodules. Upper chest: Apical emphysema and subpleural blebs. No acute  infiltrate or mass. Postsurgical changes in left apex with resection. Review of the MIP images confirms the above findings CTA  HEAD FINDINGS Anterior circulation: Mild atherosclerotic disease in the cavernous carotid bilaterally without significant stenosis. Anterior and middle cerebral arteries are patent bilaterally without occlusion. Small inferior branch of the middle cerebral artery bilaterally. Intracranial vessels are diffusely small but without focal stenosis. Posterior circulation: Both vertebral arteries patent to the basilar. Basilar widely patent. PICA, superior cerebellar, posterior cerebral arteries patent bilaterally. Posterior communicating artery patent bilaterally. Venous sinuses: Negative Anatomic variants: None Delayed phase: Not perform Review of the MIP images confirms the above findings IMPRESSION: 1. Negative CTA of the neck.  No carotid or vertebral stenosis 2. No significant intracranial stenosis. No emergent large vessel occlusion. Relatively small caliber intracranial circulation. 3. Thyroid goiter Electronically Signed   By: Franchot Gallo M.D.   On: 01/06/2018 13:22   Ct Angio Neck W Or Wo Contrast  Result Date: 01/06/2018 CLINICAL DATA:  Stroke.  Aphasia and right facial droop EXAM: CT ANGIOGRAPHY HEAD AND NECK TECHNIQUE: Multidetector CT imaging of the head and neck was performed using the standard protocol during bolus administration of intravenous contrast. Multiplanar CT image reconstructions and MIPs were obtained to evaluate the vascular anatomy. Carotid stenosis measurements (when applicable) are obtained utilizing NASCET criteria, using the distal internal carotid diameter as the denominator. CONTRAST:  84mL ISOVUE-370 IOPAMIDOL (ISOVUE-370) INJECTION 76% COMPARISON:  CT head 01/06/2018 FINDINGS: CTA NECK FINDINGS Aortic arch: Minimal atherosclerotic disease aortic arch. Proximal great vessels widely patent. Left vertebral artery origin from the arch. Right carotid system:  Normal right carotid. Negative for carotid stenosis or atherosclerotic disease. Left carotid system: Normal left carotid without stenosis or atherosclerotic disease Vertebral arteries: Both vertebral arteries widely patent without stenosis or atherosclerotic disease Skeleton: Negative Other neck: Negative for mass or adenopathy. Thyroid goiter with calcifications and nodules. Upper chest: Apical emphysema and subpleural blebs. No acute infiltrate or mass. Postsurgical changes in left apex with resection. Review of the MIP images confirms the above findings CTA HEAD FINDINGS Anterior circulation: Mild atherosclerotic disease in the cavernous carotid bilaterally without significant stenosis. Anterior and middle cerebral arteries are patent bilaterally without occlusion. Small inferior branch of the middle cerebral artery bilaterally. Intracranial vessels are diffusely small but without focal stenosis. Posterior circulation: Both vertebral arteries patent to the basilar. Basilar widely patent. PICA, superior cerebellar, posterior cerebral arteries patent bilaterally. Posterior communicating artery patent bilaterally. Venous sinuses: Negative Anatomic variants: None Delayed phase: Not perform Review of the MIP images confirms the above findings IMPRESSION: 1. Negative CTA of the neck.  No carotid or vertebral stenosis 2. No significant intracranial stenosis. No emergent large vessel occlusion. Relatively small caliber intracranial circulation. 3. Thyroid goiter Electronically Signed   By: Franchot Gallo M.D.   On: 01/06/2018 13:22   Mr Brain Wo Contrast  Result Date: 01/06/2018 CLINICAL DATA:  Followup stroke. Recent development of aphasia and right facial droop. Previous right PICA stroke. EXAM: MRI HEAD WITHOUT CONTRAST TECHNIQUE: Multiplanar, multiecho pulse sequences of the brain and surrounding structures were obtained without intravenous contrast. COMPARISON:  Multiple CT examinations same day. CT and MRI  examinations 01/01/2018 FINDINGS: Brain: Diffusion imaging again shows of all of inguinal far shin in the right posterior inferior cerebellar artery territory. No extension in that region. New subcentimeter acute infarction in the left cerebellum without swelling or hemorrhage. New 5 cm region of acute infarction in the left MCA territory affecting the deep insula and left parietal cortical and subcortical brain consistent with left MCA branch vessel occlusion. No swelling or hemorrhage. Small of all vein left  posterior frontal cortical infarction since the previous exam. Elsewhere, there is chronic small-vessel ischemic change affecting the pons and cerebellar hemispheres. Cerebral hemispheres elsewhere show chronic small-vessel ischemic changes of the deep and subcortical white matter and some old small vessel infarctions of the thalami and basal ganglia. No hydrocephalus. No extra-axial collection. Vascular: Major vessels at the base of the brain show flow. Skull and upper cervical spine: Negative Sinuses/Orbits: Clear/normal Other: None IMPRESSION: New 5 cm region of acute infarction in the left deep insula and parietal lobe consistent with MCA branch vessel occlusion. New 1 cm acute infarction in the left cerebellum. Expected evolutionary changes within the right posterior inferior cerebellar artery territory stroke shown previously. No significant swelling or hemorrhage. Redemonstration of a minimal left posterior frontal cortical infarction without swelling or hemorrhage. Multiple vascular territory infarctions suggest embolic disease from the heart or ascending aorta. Chronic small-vessel ischemic changes throughout the brain appear unchanged. Electronically Signed   By: Nelson Chimes M.D.   On: 01/06/2018 17:30   Ct Head Code Stroke Wo Contrast  Result Date: 01/06/2018 CLINICAL DATA:  Code stroke.  Aphasia right facial droop EXAM: CT HEAD WITHOUT CONTRAST TECHNIQUE: Contiguous axial images were obtained  from the base of the skull through the vertex without intravenous contrast. COMPARISON:  CT head 01/01/2018 FINDINGS: Brain: Generalized atrophy. Right cerebellar infarct shows progressive volume loss compared with the prior study. Mild chronic microvascular ischemia in the white matter. No acute cortically based infarct. Negative for hemorrhage or mass. Vascular: Negative for hyperdense vessel Skull: Negative Sinuses/Orbits: Paranasal sinuses clear.  Bilateral cataract surgery Other: None ASPECTS ([place] Stroke Program Early CT Score) - Ganglionic level infarction (caudate, lentiform nuclei, internal capsule, insula, M1-M3 cortex): 7 - Supraganglionic infarction (M4-M6 cortex): 3 Total score (0-10 with 10 being normal): 10 IMPRESSION: 1. Negative for acute infarct or hemorrhage 2. Contracting right cerebellar infarct which is a recent infarct as noted on recent studies. 3. ASPECTS is 10 4. These results were called by telephone at the time of interpretation on 01/06/2018 at 12:24 pm to Dr. Erlinda Hong , who verbally acknowledged these results. Electronically Signed   By: Franchot Gallo M.D.   On: 01/06/2018 12:24    EKG: Independently reviewed.  Atrial fibrillation with left ventricular hypertrophy   Assessment/Plan Principal Problem:   Cerebral embolism with cerebral infarction Active Problems:   Seizure (HCC)   HTN (hypertension)   Atrial fibrillation (HCC)   Acute on chronic diastolic CHF (congestive heart failure), NYHA class 3 (HCC)   Neurofibromatosis (HCC)   Hyperlipidemia   1.  Cerebral embolism with cerebral infarction: Patient has been seen by neurology who have recommended an MRI of the brain, EEG, and CT of the chest abdomen and pelvis.  The fact that the patient has had 2 strokes within 4 days suggest that she may have an underlying malignancy given that she was already anticoagulated.  She will be admitted for further evaluation of that.  2.  Possible seizure: Patient underwent EEG in the  emergency department reading is pending.  Dr. Erlinda Hong will see the patient and give further recommendations.  3.  Hypertension: Blood pressures have been well controlled in the emergency department but will hold blood pressure medication.  Which includes Cardizem and enalapril however we will continue patient's metoprolol as she does need some rate control.  4.  Atrial fibrillation continue Eliquis and metoprolol.  Restart diltiazem as soon as possible.  5.  Acute on chronic diastolic congestive heart failure [place]  Heart Association class III: Continue Lasix patient is already on metoprolol.  We are continuing her Eliquis and Lipitor.  6.  Neurofibromatosis: Noted.  7.  Hyperlipidemia noted continue atorvastatin.    DVT prophylaxis: Eliquis Code Status: On admission to [facility] health care patient told staff there that she was a DNR and her sister reiterated that to me. Family Communication: Spoke with patient's only relative, her sister, who was present at the bedside. Disposition Plan: Likely back to [facility] and rehab Consults called: Dr. Erlinda Hong from neurology Admission status: Observation as I expect work-up to be completed tomorrow.   Lady Deutscher MD FACP [facility] Pager [phone]  If 7PM-7AM, please contact night-coverage [email or web address] Password [facility]  01/06/2018, 5:54 PM

## 2018-01-06 NOTE — ED Notes (Signed)
IV team at bedside 

## 2018-01-06 NOTE — ED Notes (Signed)
Patient transported to MRI 

## 2018-01-06 NOTE — Progress Notes (Signed)
EEG Completed; Results Pending  

## 2018-01-06 NOTE — Consult Note (Signed)
Stroke Neurology Consultation Note  Consult Requested by: Dr. Wilson Singer  Reason for Consult: code stroke  Consult Date: 01/06/18  The history was obtained from the pt sister and EMS.  During history and examination, all items were able to obtain unless otherwise noted.  History of Present Illness:  Stephanie Simmons is a 77 y.o. African American female with PMH of HTN, HLD, LVHP, AF on eliquis, hypothyroidism, recent right PICA stroke and loop placed for syncope presented as code stroke.  Pt was admitted last week for right PICA stroke likely due to noncompliance with eliquis. She was discharged to rehab 01/04/18. She was doing well until this am when her sister visited her at 10:20am and found her to have aphasia, left gaze preference and not communicating with sister. EMS called. She was last seen at baseline was 9:04am with medication including eliquis. enroute to [facility], pt had right facial twitching lasting about 45sec. After that she had right facial droop and mild right arm drift. On arrival, her right facial droop and right arm drift were resolved. She still has left facial droop which resulted from her stroke last week. However, she still has aphasia and left gaze preference on arrival. Glucose 76 and BP 151/101 with EMS.  She had right cerebellar/PICA infarct last week and admitted here, CTA head and neck moderate stenosis right supraclinoid ICA. EF 65-70% and LDL 94 with A1C 6.2. She was discharged with eliquis 5mg  bid.   LSN: 9:04am tPA Given: No: on eliquis this am  Past Medical History:  Diagnosis Date  . Chest pain    a. 04/2009 Cath: essentially nl cors, EF 65%, mod LVH.  Marland Kitchen CHF (congestive heart failure) ([place])   . CVA (cerebral vascular accident) ([place])   . GERD (gastroesophageal reflux disease)   . Hyperlipidemia   . Hypertension   . Hypothyroidism    does not take medication for this any longer per patient   . LVH (left ventricular hypertrophy)    a. 04/2009 Echo: EF 55-65%,  mild conc LVH, no reg wma, Gr 1 DD, mild MR.  . Monocytosis 04/21/2014    Past Surgical History:  Procedure Laterality Date  . ABDOMINAL HYSTERECTOMY    . LOOP RECORDER INSERTION N/A 10/16/2017   Procedure: LOOP RECORDER INSERTION;  Surgeon: Evans Lance, MD;  Location: [facility];  Service: Cardiovascular;  Laterality: N/A;  . MASS EXCISION     removal of mass- pleural     Family History  Problem Relation Age of Onset  . Other Mother        s/p PPM, died @ 59  . Heart attack Father        died @ 14.  . Other Father        enlarged heart   . Stroke Sister     Social History:  reports that she quit smoking about 25 years ago. Her smoking use included cigarettes. She has never used smokeless tobacco. She reports that she does not drink alcohol or use drugs.  Allergies: No Known Allergies  No current facility-administered medications on file prior to encounter.    Current Outpatient Medications on File Prior to Encounter  Medication Sig Dispense Refill  . acetaminophen (TYLENOL) 500 MG tablet Take 500 mg by mouth every 6 (six) hours as needed (pain).    Marland Kitchen atorvastatin (LIPITOR) 80 MG tablet Take 1 tablet (80 mg total) by mouth every evening.    . bimatoprost (LUMIGAN) 0.01 % SOLN Place 1 drop  into both eyes at bedtime.    . chlorhexidine (PERIDEX) 0.12 % solution Use as directed 15 mLs in the mouth or throat 2 (two) times daily.    . Cholecalciferol (VITAMIN D) 2000 units tablet Take 2,000 Units by mouth daily.    Marland Kitchen dextromethorphan (DELSYM) 30 MG/5ML liquid Take 60 mg by mouth as needed for cough.    . diltiazem (CARDIZEM CD) 120 MG 24 hr capsule Take 1 capsule (120 mg total) by mouth daily.    . dorzolamide-timolol (COSOPT) 22.3-6.8 MG/ML ophthalmic solution Place 1 drop into both eyes 2 (two) times daily.     Marland Kitchen ELIQUIS 5 MG TABS tablet TAKE 1 TABLET BY MOUTH TWICE A DAY 60 tablet 6  . enalapril (VASOTEC) 10 MG tablet Take 1 1/2 tablets by mouth daily (Patient taking  differently: Take 10 mg by mouth daily. ) 135 tablet 3  . Esomeprazole Magnesium (NEXIUM 24HR) 20 MG TBEC Take 20 mg by mouth daily.     Marland Kitchen FLUoxetine (PROZAC) 20 MG capsule Take 20 mg by mouth daily.  1  . furosemide (LASIX) 40 MG tablet Take 1 tablet (40 mg total) by mouth 2 (two) times daily. 90 tablet 3  . KLOR-CON M20 20 MEQ tablet TAKE 2 TABLETS (40 MEQ TOTAL) BY MOUTH 2 (TWO) TIMES DAILY. 360 tablet 3  . metoprolol succinate (TOPROL-XL) 25 MG 24 hr tablet TAKE 1 TABLET (25 MG TOTAL) BY MOUTH DAILY. TAKE WITH OR IMMEDIATELY FOLLOWING A MEAL. 90 tablet 3  . Polyethyl Glycol-Propyl Glycol (SYSTANE) 0.4-0.3 % SOLN Place 1 drop into both eyes as needed (dry eyes).     . Sodium Chloride, Hypertonic, (MURO 128 OP) Place 1 drop into both eyes 3 (three) times daily.       Review of Systems: A full ROS was attempted today and was able to be performed.  Systems assessed include - Constitutional, Eyes,  HENT, Respiratory, Cardiovascular, Gastrointestinal, Genitourinary, Integument/breast, Hematologic/lymphatic, Musculoskeletal, Neurological, Behavioral/Psych, Endocrine,  Allergic/Immunologic - with pertinent responses as per HPI.  Physical Examination: SpO2:  [94 %] 94 % (07/14 1236) Weight:  [117 lb 8.1 oz (53.3 kg)] 117 lb 8.1 oz (53.3 kg) (07/14 1240)  General - cachectic looking, well developed, in no apparent distress.    Ophthalmologic - fundi not visualized due to noncooperation.    Cardiovascular - irregularly irregular heart rate and rhythm.  Neuro - awake alert, global aphasia, able to have words out intermittently with "OK, OK", but not sentences and not meaningful. Not following commands, not able to name or repeat. PERRL, left gaze preference, barely cross midline, blinking to visual threat on the left, but not on the right. Left facial droop. Tongue midline in mouth. Moving all extremities symmetrically, BUE 4/5 and BLE 3/5. DTR 1+ and no babinski. Sensation, coordination and gait not  tested.  NIH Stroke Scale  Level Of Consciousness 0=Alert; keenly responsive 1=Not alert, but arousable by minor stimulation 2=Not alert, requires repeated stimulation 3=Responds only with reflex movements 0  LOC Questions to Month and Age 42=Answers both questions correctly 1=Answers one question correctly 2=Answers neither question correctly 2  LOC Commands      -Open/Close eyes     -Open/close grip 0=Performs both tasks correctly 1=Performs one task correctly 2=Performs neighter task correctly 2  Best Gaze 0=Normal 1=Partial gaze palsy 2=Forced deviation, or total gaze paresis 1  Visual 0=No visual loss 1=Partial hemianopia 2=Complete hemianopia 3=Bilateral hemianopia (blind including cortical blindness) 1  Facial Palsy 0=Normal symmetrical movement 1=Minor  paralysis (asymmetry) 2=Partial paralysis (lower face) 3=Complete paralysis (upper and lower face) 2  Motor  0=No drift, limb holds posture for full 10 seconds 1=Drift, limb holds posture, no drift to bed 2=Some antigravity effort, cannot maintain posture, drifts to bed 3=No effort against gravity, limb falls 4=No movement Right Arm 0     Leg 0    Left Arm 0     Leg 0  Limb Ataxia 0=Absent 1=Present in one limb 2=Present in two limbs 0  Sensory 0=Normal 1=Mild to moderate sensory loss 2=Severe to total sensory loss 0  Best Language 0=No aphasia, normal 1=Mild to moderate aphasia 2=Mute, global aphasia 3=Mute, global aphasia 2  Dysarthria 0=Normal 1=Mild to moderate 2=Severe, unintelligible or mute/anarthric 2  Extinction/Neglect 0=No abnormality 1=Extinction to bilateral simultaneous stimulation 2=Profound neglect 2  Total   14     Data Reviewed: Ct Angio Head W Or Wo Contrast  Result Date: 01/01/2018 CLINICAL DATA:  Follow-up cerebellar infarct. History of hypertension, hyperlipidemia. EXAM: CT ANGIOGRAPHY HEAD AND NECK TECHNIQUE: Multidetector CT imaging of the head and neck was performed using the  standard protocol during bolus administration of intravenous contrast. Multiplanar CT image reconstructions and MIPs were obtained to evaluate the vascular anatomy. Carotid stenosis measurements (when applicable) are obtained utilizing NASCET criteria, using the distal internal carotid diameter as the denominator. CONTRAST:  71mL ISOVUE-370 IOPAMIDOL (ISOVUE-370) INJECTION 76% COMPARISON:  MRI and CT HEAD January 01, 2018. FINDINGS: CTA NECK FINDINGS: AORTIC ARCH: Common origin of the innominate and LEFT common carotid artery. LEFT vertebral artery arises directly from the aortic arch. Arch vessel origins are patent. Mild calcific atherosclerosis aortic arch. The origins of the innominate, left Common carotid artery and subclavian artery are widely patent. RIGHT CAROTID SYSTEM: Common carotid artery is patent. Normal appearance of the carotid bifurcation without hemodynamically significant stenosis by NASCET criteria. Normal appearance of the internal carotid artery. LEFT CAROTID SYSTEM: Common carotid artery is patent. Normal appearance of the carotid bifurcation without hemodynamically significant stenosis by NASCET criteria. Normal appearance of the internal carotid artery. VERTEBRAL ARTERIES:RIGHT vertebral artery is dominant. Normal appearance of the vertebral arteries, widely patent. SKELETON: No acute osseous process though bone windows have not been submitted. OTHER NECK: Soft tissues of the neck are nonacute though, not tailored for evaluation. Thyromegaly with scattered calcifications, no dominant nodule. Hypoplastic mandible with hardware. UPPER CHEST: Severe centrilobular and paraseptal emphysema. LEFT upper chest wall deformity. LEFT calcified pleural plaque. CTA HEAD FINDINGS: ANTERIOR CIRCULATION: Patent cervical internal carotid arteries, petrous, cavernous and supra clinoid internal carotid arteries. Moderate stenosis RIGHT supraclinoid ICA. Patent anterior communicating artery. Patent anterior and  middle cerebral arteries, mild luminal irregularity compatible with atherosclerosis. Long segment moderate stenosis RIGHT M1 segment. No large vessel occlusion, significant stenosis, contrast extravasation or aneurysm. POSTERIOR CIRCULATION: Patent vertebral arteries, vertebrobasilar junction and basilar artery, as well as main branch vessels. Patent RIGHT PICA. Patent posterior cerebral arteries. Robust LEFT posterior communicating artery. No large vessel occlusion, significant stenosis, contrast extravasation or aneurysm. VENOUS SINUSES: Major dural venous sinuses are patent though not tailored for evaluation on this angiographic examination. ANATOMIC VARIANTS: Hypoplastic LEFT P1 segment. DELAYED PHASE: Not performed. MIP images reviewed. IMPRESSION: CTA NECK: 1. No hemodynamically significant stenosis ICA. Patent vertebral arteries. CTA HEAD: 1. No emergent large vessel occlusion or flow-limiting stenosis. 2. Moderate stenosis RIGHT supraclinoid ICA and RIGHT M1 segment. Emphysema (ICD10-J43.9).  Aortic Atherosclerosis (ICD10-I70.0). Electronically Signed   By: Elon Alas M.D.   On: 01/01/2018 22:57  Dg Chest 2 View  Result Date: 01/01/2018 CLINICAL DATA:  Chest pain EXAM: CHEST - 2 VIEW COMPARISON:  07/27/2016 FINDINGS: AP and lateral views of the chest show a hyperexpansion. The cardio pericardial silhouette is enlarged. Left base collapse/consolidation is associated with small left pleural effusion. May be some volume loss in the left hemithorax is well. Staple line again noted at the left apex. Bones are diffusely demineralized. Thoracolumbar scoliosis again noted. Telemetry leads overlie the chest. IMPRESSION: Retrocardiac left base collapse/consolidation with possible volume loss in the left hemithorax. Tiny left pleural effusion. Electronically Signed   By: Misty Stanley M.D.   On: 01/01/2018 15:06   Ct Head Wo Contrast  Result Date: 01/01/2018 CLINICAL DATA:  Generalized weakness for 3  days. Recent diagnosis of atrial fibrillation. EXAM: CT HEAD WITHOUT CONTRAST TECHNIQUE: Contiguous axial images were obtained from the base of the skull through the vertex without intravenous contrast. COMPARISON:  None. FINDINGS: Brain: No subdural, epidural, or subarachnoid hemorrhage. There is a rounded region of low attenuation in the right cerebellar hemisphere with mild regional mass effect. No associated hemorrhage. The remainder of the cerebellum is normal. The brainstem and basal cisterns are normal. Ventricles and sulci are unremarkable. No acute cortical ischemia or infarct otherwise seen. No midline shift. Vascular: Calcified atherosclerosis in the intracranial carotids. Skull: Normal. Negative for fracture or focal lesion. Sinuses/Orbits: No acute finding. Other: None. IMPRESSION: 1. Rounded region of low attenuation in the right cerebellar hemisphere with mild regional mass effect but no associated bleed is most consistent with an infarct, acute to subacute. 2. No other acute abnormalities. Findings called to Dr. Alvino Chapel. Electronically Signed   By: Dorise Bullion III M.D   On: 01/01/2018 15:31   Ct Angio Neck W Or Wo Contrast  Result Date: 01/01/2018 CLINICAL DATA:  Follow-up cerebellar infarct. History of hypertension, hyperlipidemia. EXAM: CT ANGIOGRAPHY HEAD AND NECK TECHNIQUE: Multidetector CT imaging of the head and neck was performed using the standard protocol during bolus administration of intravenous contrast. Multiplanar CT image reconstructions and MIPs were obtained to evaluate the vascular anatomy. Carotid stenosis measurements (when applicable) are obtained utilizing NASCET criteria, using the distal internal carotid diameter as the denominator. CONTRAST:  54mL ISOVUE-370 IOPAMIDOL (ISOVUE-370) INJECTION 76% COMPARISON:  MRI and CT HEAD January 01, 2018. FINDINGS: CTA NECK FINDINGS: AORTIC ARCH: Common origin of the innominate and LEFT common carotid artery. LEFT vertebral artery  arises directly from the aortic arch. Arch vessel origins are patent. Mild calcific atherosclerosis aortic arch. The origins of the innominate, left Common carotid artery and subclavian artery are widely patent. RIGHT CAROTID SYSTEM: Common carotid artery is patent. Normal appearance of the carotid bifurcation without hemodynamically significant stenosis by NASCET criteria. Normal appearance of the internal carotid artery. LEFT CAROTID SYSTEM: Common carotid artery is patent. Normal appearance of the carotid bifurcation without hemodynamically significant stenosis by NASCET criteria. Normal appearance of the internal carotid artery. VERTEBRAL ARTERIES:RIGHT vertebral artery is dominant. Normal appearance of the vertebral arteries, widely patent. SKELETON: No acute osseous process though bone windows have not been submitted. OTHER NECK: Soft tissues of the neck are nonacute though, not tailored for evaluation. Thyromegaly with scattered calcifications, no dominant nodule. Hypoplastic mandible with hardware. UPPER CHEST: Severe centrilobular and paraseptal emphysema. LEFT upper chest wall deformity. LEFT calcified pleural plaque. CTA HEAD FINDINGS: ANTERIOR CIRCULATION: Patent cervical internal carotid arteries, petrous, cavernous and supra clinoid internal carotid arteries. Moderate stenosis RIGHT supraclinoid ICA. Patent anterior communicating artery. Patent anterior and middle  cerebral arteries, mild luminal irregularity compatible with atherosclerosis. Long segment moderate stenosis RIGHT M1 segment. No large vessel occlusion, significant stenosis, contrast extravasation or aneurysm. POSTERIOR CIRCULATION: Patent vertebral arteries, vertebrobasilar junction and basilar artery, as well as main branch vessels. Patent RIGHT PICA. Patent posterior cerebral arteries. Robust LEFT posterior communicating artery. No large vessel occlusion, significant stenosis, contrast extravasation or aneurysm. VENOUS SINUSES: Major  dural venous sinuses are patent though not tailored for evaluation on this angiographic examination. ANATOMIC VARIANTS: Hypoplastic LEFT P1 segment. DELAYED PHASE: Not performed. MIP images reviewed. IMPRESSION: CTA NECK: 1. No hemodynamically significant stenosis ICA. Patent vertebral arteries. CTA HEAD: 1. No emergent large vessel occlusion or flow-limiting stenosis. 2. Moderate stenosis RIGHT supraclinoid ICA and RIGHT M1 segment. Emphysema (ICD10-J43.9).  Aortic Atherosclerosis (ICD10-I70.0). Electronically Signed   By: Elon Alas M.D.   On: 01/01/2018 22:57   Mr Brain Wo Contrast  Result Date: 01/01/2018 CLINICAL DATA:  Generalized weakness, LEFT facial droop for a few days. Follow up stroke. History of hypertension, hyperlipidemia. EXAM: MRI HEAD WITHOUT CONTRAST TECHNIQUE: Multiplanar, multiecho pulse sequences of the brain and surrounding structures were obtained without intravenous contrast. COMPARISON:  CT HEAD January 01, 2018 FINDINGS: INTRACRANIAL CONTENTS: Wedge-like reduced diffusion RIGHT inferior cerebellum with low to normalized ADC values and RIGHT FLAIR signal. A few punctate foci reduced diffusion RIGHT mid cerebellum. Regional mass effect without midline shift. Old small bilateral cerebellar infarcts. Chronic microhemorrhage LEFT occipital lobe without susceptibility artifact to suggest acute blood products. Old LEFT basal ganglia lacunar infarct. Prominent basal ganglia and thalami perivascular spaces associated chronic small vessel ischemic changes. Patchy supratentorial and pontine white matter FLAIR T2 hyperintensities compatible with chronic small vessel ischemic changes, normal for age. No parenchymal brain volume loss for age. No hydrocephalus. No abnormal extra-axial fluid collections. VASCULAR: Normal major intracranial vascular flow voids present at skull base. SKULL AND UPPER CERVICAL SPINE: No abnormal sellar expansion. No suspicious calvarial bone marrow signal.  Craniocervical junction maintained. SINUSES/ORBITS: The mastoid air-cells and included paranasal sinuses are well-aerated.The included ocular globes and orbital contents are non-suspicious. Status post bilateral ocular lens implants. OTHER: None. IMPRESSION: 1. Acute nonhemorrhagic RIGHT cerebellar/PICA territory infarct. 2. Old small bilateral cerebellar and LEFT basal ganglia infarcts. Mild chronic small vessel ischemic changes. Electronically Signed   By: Elon Alas M.D.   On: 01/01/2018 22:13   Nm Pulmonary Perf And Vent  Result Date: 01/02/2018 CLINICAL DATA:  Short of breath. Heart failure. Pulmonary hypertension. EXAM: NUCLEAR MEDICINE VENTILATION - PERFUSION LUNG SCAN TECHNIQUE: Ventilation images were obtained in multiple projections using inhaled aerosol Tc-62m DTPA. Perfusion images were obtained in multiple projections after intravenous injection of Tc-58m-MAA. RADIOPHARMACEUTICALS:  31.7 mCi of Tc-97m DTPA aerosol inhalation and 4.2 mCi Tc78m-MAA IV COMPARISON:  None. FINDINGS: Ventilation: No focal ventilation defect. Small peripheral ventilation defects in LEFT and RIGHT lung. Perfusion: No wedge shaped peripheral perfusion defects to suggest acute pulmonary embolism. IMPRESSION: 1. No evidence of acute pulmonary embolism. 2. Peripheral ventilation defects consistent with COPD/emphysema Electronically Signed   By: Suzy Bouchard M.D.   On: 01/02/2018 15:59   Ct Head Code Stroke Wo Contrast  Result Date: 01/06/2018 CLINICAL DATA:  Code stroke.  Aphasia right facial droop EXAM: CT HEAD WITHOUT CONTRAST TECHNIQUE: Contiguous axial images were obtained from the base of the skull through the vertex without intravenous contrast. COMPARISON:  CT head 01/01/2018 FINDINGS: Brain: Generalized atrophy. Right cerebellar infarct shows progressive volume loss compared with the prior study. Mild chronic microvascular ischemia in  the white matter. No acute cortically based infarct. Negative for  hemorrhage or mass. Vascular: Negative for hyperdense vessel Skull: Negative Sinuses/Orbits: Paranasal sinuses clear.  Bilateral cataract surgery Other: None ASPECTS ([place] Stroke Program Early CT Score) - Ganglionic level infarction (caudate, lentiform nuclei, internal capsule, insula, M1-M3 cortex): 7 - Supraganglionic infarction (M4-M6 cortex): 3 Total score (0-10 with 10 being normal): 10 IMPRESSION: 1. Negative for acute infarct or hemorrhage 2. Contracting right cerebellar infarct which is a recent infarct as noted on recent studies. 3. ASPECTS is 10 4. These results were called by telephone at the time of interpretation on 01/06/2018 at 12:24 pm to Dr. Erlinda Hong , who verbally acknowledged these results. Electronically Signed   By: Franchot Gallo M.D.   On: 01/06/2018 12:24    Assessment: 78 y.o. female with PMH of HTN, HLD, AF on eliquis, hypothyroidism, recent right PICA stroke and loop placed for syncope presented as code stroke with acute aphasia, left gaze preference and right neglect as well as focal seizure enroute. Concerning for left MCA syndrome. Stat CT no bleeding. CTA head and neck no LVO. Will admit to internal medicine for stroke work up. Pt likely to have left MCA distal cortical infarct.  Stroke Risk Factors - HTN, HLD, AF , recent stroke  Plan: - internal medicine admission for further stroke work up - MRI of the brain without contrast - PT consult, OT consult, Speech consult - EEG for focal right facial twitching - CT chest pelvis and abdomen to evaluate for malignancy given recurrent strokes while on [facility] - Prophylactic therapy- eliquis  - Risk factor modification - Telemetry monitoring - Frequent neuro checks  Thank you for this consultation and allowing [place] to participate in the care of this patient.  Rosalin Hawking, MD PhD Stroke Neurology 01/06/2018 3:35 PM  This patient is critically ill due to code stroke, recurrent embolic stroke, AF, FTT, seizure and at significant risk  of neurological worsening, death form recurrent stroke, hemorrhagic conversion, heart failure, status epilepticus. This patient's care requires constant monitoring of vital signs, hemodynamics, respiratory and cardiac monitoring, review of multiple databases, neurological assessment, discussion with family, other specialists and medical decision making of high complexity. I spent 55 minutes of neurocritical care time in the care of this patient.

## 2018-01-06 NOTE — ED Triage Notes (Signed)
Pt arrived via [facility] from [facility] c/o left sided facial droop and inability to speak clearly.  LKN [id number].  EMS reports right sided facial twitching seizure like activity for 30-45 seconds.  EMS unable to obtain IV access.  EMS reports pt had stroke last week and has left sided deficits as a result of.

## 2018-01-06 NOTE — ED Notes (Signed)
Multiple IV attempts by 2 different RN's at CT with no success.  Reported to MD Neuro, NP [place] to place IV team order.

## 2018-01-07 ENCOUNTER — Observation Stay (HOSPITAL_COMMUNITY): Payer: Medicare Other

## 2018-01-07 DIAGNOSIS — E785 Hyperlipidemia, unspecified: Secondary | ICD-10-CM | POA: Diagnosis present

## 2018-01-07 DIAGNOSIS — I6340 Cerebral infarction due to embolism of unspecified cerebral artery: Secondary | ICD-10-CM | POA: Diagnosis not present

## 2018-01-07 DIAGNOSIS — I481 Persistent atrial fibrillation: Secondary | ICD-10-CM | POA: Diagnosis present

## 2018-01-07 DIAGNOSIS — R278 Other lack of coordination: Secondary | ICD-10-CM | POA: Diagnosis not present

## 2018-01-07 DIAGNOSIS — Z87891 Personal history of nicotine dependence: Secondary | ICD-10-CM | POA: Diagnosis not present

## 2018-01-07 DIAGNOSIS — I480 Paroxysmal atrial fibrillation: Secondary | ICD-10-CM | POA: Diagnosis not present

## 2018-01-07 DIAGNOSIS — R4701 Aphasia: Secondary | ICD-10-CM | POA: Diagnosis present

## 2018-01-07 DIAGNOSIS — I110 Hypertensive heart disease with heart failure: Secondary | ICD-10-CM | POA: Diagnosis present

## 2018-01-07 DIAGNOSIS — Z823 Family history of stroke: Secondary | ICD-10-CM | POA: Diagnosis not present

## 2018-01-07 DIAGNOSIS — G9341 Metabolic encephalopathy: Secondary | ICD-10-CM | POA: Diagnosis present

## 2018-01-07 DIAGNOSIS — I63412 Cerebral infarction due to embolism of left middle cerebral artery: Secondary | ICD-10-CM | POA: Diagnosis not present

## 2018-01-07 DIAGNOSIS — Z8249 Family history of ischemic heart disease and other diseases of the circulatory system: Secondary | ICD-10-CM | POA: Diagnosis not present

## 2018-01-07 DIAGNOSIS — R41841 Cognitive communication deficit: Secondary | ICD-10-CM | POA: Diagnosis not present

## 2018-01-07 DIAGNOSIS — R2689 Other abnormalities of gait and mobility: Secondary | ICD-10-CM | POA: Diagnosis not present

## 2018-01-07 DIAGNOSIS — R29714 NIHSS score 14: Secondary | ICD-10-CM | POA: Diagnosis present

## 2018-01-07 DIAGNOSIS — I10 Essential (primary) hypertension: Secondary | ICD-10-CM | POA: Diagnosis not present

## 2018-01-07 DIAGNOSIS — I272 Pulmonary hypertension, unspecified: Secondary | ICD-10-CM

## 2018-01-07 DIAGNOSIS — I5033 Acute on chronic diastolic (congestive) heart failure: Secondary | ICD-10-CM | POA: Diagnosis not present

## 2018-01-07 DIAGNOSIS — I2721 Secondary pulmonary arterial hypertension: Secondary | ICD-10-CM | POA: Diagnosis present

## 2018-01-07 DIAGNOSIS — Z90710 Acquired absence of both cervix and uterus: Secondary | ICD-10-CM | POA: Diagnosis not present

## 2018-01-07 DIAGNOSIS — Z66 Do not resuscitate: Secondary | ICD-10-CM | POA: Diagnosis present

## 2018-01-07 DIAGNOSIS — E039 Hypothyroidism, unspecified: Secondary | ICD-10-CM | POA: Diagnosis present

## 2018-01-07 DIAGNOSIS — R1312 Dysphagia, oropharyngeal phase: Secondary | ICD-10-CM | POA: Diagnosis not present

## 2018-01-07 DIAGNOSIS — R414 Neurologic neglect syndrome: Secondary | ICD-10-CM | POA: Diagnosis present

## 2018-01-07 DIAGNOSIS — K219 Gastro-esophageal reflux disease without esophagitis: Secondary | ICD-10-CM | POA: Diagnosis present

## 2018-01-07 DIAGNOSIS — M2550 Pain in unspecified joint: Secondary | ICD-10-CM | POA: Diagnosis not present

## 2018-01-07 DIAGNOSIS — J439 Emphysema, unspecified: Secondary | ICD-10-CM | POA: Diagnosis not present

## 2018-01-07 DIAGNOSIS — E875 Hyperkalemia: Secondary | ICD-10-CM | POA: Diagnosis present

## 2018-01-07 DIAGNOSIS — R29810 Facial weakness: Secondary | ICD-10-CM | POA: Diagnosis present

## 2018-01-07 DIAGNOSIS — Z79899 Other long term (current) drug therapy: Secondary | ICD-10-CM | POA: Diagnosis not present

## 2018-01-07 DIAGNOSIS — I482 Chronic atrial fibrillation: Secondary | ICD-10-CM | POA: Diagnosis not present

## 2018-01-07 DIAGNOSIS — Z7401 Bed confinement status: Secondary | ICD-10-CM | POA: Diagnosis not present

## 2018-01-07 DIAGNOSIS — G4089 Other seizures: Secondary | ICD-10-CM | POA: Diagnosis present

## 2018-01-07 DIAGNOSIS — M6281 Muscle weakness (generalized): Secondary | ICD-10-CM | POA: Diagnosis not present

## 2018-01-07 DIAGNOSIS — I639 Cerebral infarction, unspecified: Secondary | ICD-10-CM | POA: Diagnosis not present

## 2018-01-07 DIAGNOSIS — Z8673 Personal history of transient ischemic attack (TIA), and cerebral infarction without residual deficits: Secondary | ICD-10-CM | POA: Diagnosis not present

## 2018-01-07 DIAGNOSIS — R188 Other ascites: Secondary | ICD-10-CM | POA: Diagnosis not present

## 2018-01-07 DIAGNOSIS — Q8500 Neurofibromatosis, unspecified: Secondary | ICD-10-CM | POA: Diagnosis not present

## 2018-01-07 DIAGNOSIS — R498 Other voice and resonance disorders: Secondary | ICD-10-CM | POA: Diagnosis not present

## 2018-01-07 DIAGNOSIS — Z7901 Long term (current) use of anticoagulants: Secondary | ICD-10-CM | POA: Diagnosis not present

## 2018-01-07 DIAGNOSIS — R0902 Hypoxemia: Secondary | ICD-10-CM | POA: Diagnosis not present

## 2018-01-07 LAB — LIPID PANEL
Cholesterol: 135 mg/dL (ref 0–200)
HDL: 35 mg/dL — AB (ref >40)
LDL CALC: 88 mg/dL (ref 0–99)
TRIGLYCERIDES: 59 mg/dL (ref <150)
Total CHOL/HDL Ratio: 3.9 RATIO
VLDL: 12 mg/dL (ref 0–40)

## 2018-01-07 LAB — HEMOGLOBIN A1C
HEMOGLOBIN A1C: 6.2 % — AB (ref 4.8–5.6)
Mean Plasma Glucose: 131.24 mg/dL

## 2018-01-07 MED ORDER — FUROSEMIDE 20 MG PO TABS
40.0000 mg | ORAL_TABLET | Freq: Two times a day (BID) | ORAL | Status: DC
  Administered 2018-01-08: 40 mg via ORAL
  Filled 2018-01-07: qty 2

## 2018-01-07 MED ORDER — IOHEXOL 300 MG/ML  SOLN
100.0000 mL | Freq: Once | INTRAMUSCULAR | Status: AC | PRN
  Administered 2018-01-07: 100 mL via INTRAVENOUS

## 2018-01-07 MED ORDER — SODIUM CHLORIDE 0.9 % IV SOLN
INTRAVENOUS | Status: DC
  Administered 2018-01-07: 13:00:00 via INTRAVENOUS

## 2018-01-07 NOTE — Progress Notes (Signed)
Progress Note    Stephanie Simmons  ZOX:[id number] DOB: [birth year]  DOA: 01/06/2018 PCP: Leighton Ruff, MD    Brief Narrative:   Medical records reviewed and are as summarized below:  Stephanie Simmons is an 77 y.o. female with medical history significant of hypertension, hyperlipidemia, CHF, atrial fibrillation anticoagulated on Eliquis discharged on Friday after a nonhemorrhagic right cellebeller/PICA territory infarction.     Assessment/Plan:   Principal Problem:   Cerebral embolism with cerebral infarction Active Problems:   HTN (hypertension)   Atrial fibrillation (HCC)   Neurofibromatosis (HCC)   Acute on chronic diastolic CHF (congestive heart failure), NYHA class 3 (HCC)   Hyperlipidemia   Seizure (HCC)  Cerebral embolism with cerebral infarction:  -MRI of the brain shows new CVAs -EEG w/o seizure focus -CT of the chest abdomen and pelvis PENDING - 2 strokes within 4 days suggest that she may have an underlying malignancy given that she was already anticoagulated.  She will be admitted for further evaluation of that.  Possible seizure:  -EEG w/o current seizure like activity   Hypertension:  -permissive HTN  Atrial fibrillation continue Eliquis and metoprolol.  Restart diltiazem as soon as possible.  chronic diastolic congestive heart failure New [place] Heart Association class III:  -Continue Lasix patient is already on metoprolol.  We are continuing her Eliquis and Lipitor.   Hyperlipidemia -continue atorvastatin.  Severe pulmonary HTN -being sent to Dr. Aundra Dubin as an outpatient   Family Communication/Anticipated D/C date and plan/Code Status   DVT prophylaxis: equis Code Status: DNR  Family Communication: sister at bedside Disposition Plan: back to SNF when bed available   Medical Consultants:    neuro     Subjective:   Answers yes to all questions  Objective:    Vitals:   01/06/18 2300 01/06/18 2352 01/07/18 0338 01/07/18  0904  BP: 132/79 133/79 139/71 127/83  Pulse: 79 81 83 85  Resp: 18 18 18 16   Temp: 98.1 F (36.7 C) 98 F (36.7 C) 97.9 F (36.6 C) (!) 97.5 F (36.4 C)  TempSrc: Oral Oral Oral Oral  SpO2: 94% 93% 94% (!) 76%  Weight:      Height:       No intake or output data in the 24 hours ending 01/07/18 1128 Filed Weights   01/06/18 1240  Weight: 53.3 kg (117 lb 8.1 oz)    Exam: Awake, chronically ill appearing irr HR No increased worse of breathing Says yes to all questions  Data Reviewed:   I have personally reviewed following labs and imaging studies:  Labs: Labs show the following:   Basic Metabolic Panel: Recent Labs  Lab 01/01/18 1440 01/02/18 0551 01/03/18 0526 01/04/18 0426 01/06/18 1339 01/06/18 1353  NA 136 137 136 135 132* 134*  K 4.9 3.6 5.0 4.9 4.6 4.9  CL 104 103 105 105 100 100  CO2 21* 23 20* 24 24  --   GLUCOSE 85 101* 105* 102* 112* 111*  BUN 16 12 13 9 8 9   CREATININE 0.86 0.76 0.80 0.65 0.73 0.60  CALCIUM 9.2 9.0 9.0 9.1 9.3  --   MG  --  1.7  --   --   --   --    GFR Estimated Creatinine Clearance: 46.6 mL/min (by C-G formula based on SCr of 0.6 mg/dL). Liver Function Tests: Recent Labs  Lab 01/01/18 1440 01/03/18 0526 01/06/18 1339  AST 29 31 24   ALT 27 30 32  ALKPHOS 43 41 64  BILITOT 1.4* 1.0 0.8  PROT 5.6* 5.3* 6.2*  ALBUMIN 3.3* 2.9* 3.1*   No results for input(s): LIPASE, AMYLASE in the last 168 hours. No results for input(s): AMMONIA in the last 168 hours. Coagulation profile Recent Labs  Lab 01/01/18 1440 01/06/18 1339  INR 1.55 1.81    CBC: Recent Labs  Lab 01/01/18 1440 01/02/18 0551 01/06/18 1339 01/06/18 1353  WBC 7.5 7.5 10.5  --   NEUTROABS  --  4.7 7.4  --   HGB 13.8 13.4 15.6* 18.4*  HCT 45.2 44.3 50.8* 54.0*  MCV 81.6 81.7 82.3  --   PLT 245 252 345  --    Cardiac Enzymes: Recent Labs  Lab 01/01/18 1440 01/01/18 1824 01/02/18 0551  TROPONINI 0.13* 0.11* 0.10*   BNP (last 3 results) No  results for input(s): PROBNP in the last 8760 hours. CBG: Recent Labs  Lab 01/03/18 1125 01/03/18 1555 01/03/18 2145 01/04/18 1108 01/06/18 1342  GLUCAP 118* 111* 117* 135* 106*   D-Dimer: No results for input(s): DDIMER in the last 72 hours. Hgb A1c: Recent Labs    01/07/18 0742  HGBA1C 6.2*   Lipid Profile: Recent Labs    01/07/18 0742  CHOL 135  HDL 35*  LDLCALC 88  TRIG 59  CHOLHDL 3.9   Thyroid function studies: No results for input(s): TSH, T4TOTAL, T3FREE, THYROIDAB in the last 72 hours.  Invalid input(s): FREET3 Anemia work up: No results for input(s): VITAMINB12, FOLATE, FERRITIN, TIBC, IRON, RETICCTPCT in the last 72 hours. Sepsis Labs: Recent Labs  Lab 01/01/18 1440 01/02/18 0551 01/06/18 1339  WBC 7.5 7.5 10.5    Microbiology No results found for this or any previous visit (from the past 240 hour(s)).  Procedures and diagnostic studies:  Ct Angio Head W Or Wo Contrast  Result Date: 01/06/2018 CLINICAL DATA:  Stroke.  Aphasia and right facial droop EXAM: CT ANGIOGRAPHY HEAD AND NECK TECHNIQUE: Multidetector CT imaging of the head and neck was performed using the standard protocol during bolus administration of intravenous contrast. Multiplanar CT image reconstructions and MIPs were obtained to evaluate the vascular anatomy. Carotid stenosis measurements (when applicable) are obtained utilizing NASCET criteria, using the distal internal carotid diameter as the denominator. CONTRAST:  36mL ISOVUE-370 IOPAMIDOL (ISOVUE-370) INJECTION 76% COMPARISON:  CT head 01/06/2018 FINDINGS: CTA NECK FINDINGS Aortic arch: Minimal atherosclerotic disease aortic arch. Proximal great vessels widely patent. Left vertebral artery origin from the arch. Right carotid system: Normal right carotid. Negative for carotid stenosis or atherosclerotic disease. Left carotid system: Normal left carotid without stenosis or atherosclerotic disease Vertebral arteries: Both vertebral  arteries widely patent without stenosis or atherosclerotic disease Skeleton: Negative Other neck: Negative for mass or adenopathy. Thyroid goiter with calcifications and nodules. Upper chest: Apical emphysema and subpleural blebs. No acute infiltrate or mass. Postsurgical changes in left apex with resection. Review of the MIP images confirms the above findings CTA HEAD FINDINGS Anterior circulation: Mild atherosclerotic disease in the cavernous carotid bilaterally without significant stenosis. Anterior and middle cerebral arteries are patent bilaterally without occlusion. Small inferior branch of the middle cerebral artery bilaterally. Intracranial vessels are diffusely small but without focal stenosis. Posterior circulation: Both vertebral arteries patent to the basilar. Basilar widely patent. PICA, superior cerebellar, posterior cerebral arteries patent bilaterally. Posterior communicating artery patent bilaterally. Venous sinuses: Negative Anatomic variants: None Delayed phase: Not perform Review of the MIP images confirms the above findings IMPRESSION: 1. Negative CTA of the neck.  No carotid or vertebral stenosis  2. No significant intracranial stenosis. No emergent large vessel occlusion. Relatively small caliber intracranial circulation. 3. Thyroid goiter Electronically Signed   By: Franchot Gallo M.D.   On: 01/06/2018 13:22   Ct Angio Neck W Or Wo Contrast  Result Date: 01/06/2018 CLINICAL DATA:  Stroke.  Aphasia and right facial droop EXAM: CT ANGIOGRAPHY HEAD AND NECK TECHNIQUE: Multidetector CT imaging of the head and neck was performed using the standard protocol during bolus administration of intravenous contrast. Multiplanar CT image reconstructions and MIPs were obtained to evaluate the vascular anatomy. Carotid stenosis measurements (when applicable) are obtained utilizing NASCET criteria, using the distal internal carotid diameter as the denominator. CONTRAST:  9mL ISOVUE-370 IOPAMIDOL  (ISOVUE-370) INJECTION 76% COMPARISON:  CT head 01/06/2018 FINDINGS: CTA NECK FINDINGS Aortic arch: Minimal atherosclerotic disease aortic arch. Proximal great vessels widely patent. Left vertebral artery origin from the arch. Right carotid system: Normal right carotid. Negative for carotid stenosis or atherosclerotic disease. Left carotid system: Normal left carotid without stenosis or atherosclerotic disease Vertebral arteries: Both vertebral arteries widely patent without stenosis or atherosclerotic disease Skeleton: Negative Other neck: Negative for mass or adenopathy. Thyroid goiter with calcifications and nodules. Upper chest: Apical emphysema and subpleural blebs. No acute infiltrate or mass. Postsurgical changes in left apex with resection. Review of the MIP images confirms the above findings CTA HEAD FINDINGS Anterior circulation: Mild atherosclerotic disease in the cavernous carotid bilaterally without significant stenosis. Anterior and middle cerebral arteries are patent bilaterally without occlusion. Small inferior branch of the middle cerebral artery bilaterally. Intracranial vessels are diffusely small but without focal stenosis. Posterior circulation: Both vertebral arteries patent to the basilar. Basilar widely patent. PICA, superior cerebellar, posterior cerebral arteries patent bilaterally. Posterior communicating artery patent bilaterally. Venous sinuses: Negative Anatomic variants: None Delayed phase: Not perform Review of the MIP images confirms the above findings IMPRESSION: 1. Negative CTA of the neck.  No carotid or vertebral stenosis 2. No significant intracranial stenosis. No emergent large vessel occlusion. Relatively small caliber intracranial circulation. 3. Thyroid goiter Electronically Signed   By: Franchot Gallo M.D.   On: 01/06/2018 13:22   Mr Brain Wo Contrast  Result Date: 01/06/2018 CLINICAL DATA:  Followup stroke. Recent development of aphasia and right facial droop. Previous  right PICA stroke. EXAM: MRI HEAD WITHOUT CONTRAST TECHNIQUE: Multiplanar, multiecho pulse sequences of the brain and surrounding structures were obtained without intravenous contrast. COMPARISON:  Multiple CT examinations same day. CT and MRI examinations 01/01/2018 FINDINGS: Brain: Diffusion imaging again shows of all of inguinal far shin in the right posterior inferior cerebellar artery territory. No extension in that region. New subcentimeter acute infarction in the left cerebellum without swelling or hemorrhage. New 5 cm region of acute infarction in the left MCA territory affecting the deep insula and left parietal cortical and subcortical brain consistent with left MCA branch vessel occlusion. No swelling or hemorrhage. Small of all vein left posterior frontal cortical infarction since the previous exam. Elsewhere, there is chronic small-vessel ischemic change affecting the pons and cerebellar hemispheres. Cerebral hemispheres elsewhere show chronic small-vessel ischemic changes of the deep and subcortical white matter and some old small vessel infarctions of the thalami and basal ganglia. No hydrocephalus. No extra-axial collection. Vascular: Major vessels at the base of the brain show flow. Skull and upper cervical spine: Negative Sinuses/Orbits: Clear/normal Other: None IMPRESSION: New 5 cm region of acute infarction in the left deep insula and parietal lobe consistent with MCA branch vessel occlusion. New 1 cm acute  infarction in the left cerebellum. Expected evolutionary changes within the right posterior inferior cerebellar artery territory stroke shown previously. No significant swelling or hemorrhage. Redemonstration of a minimal left posterior frontal cortical infarction without swelling or hemorrhage. Multiple vascular territory infarctions suggest embolic disease from the heart or ascending aorta. Chronic small-vessel ischemic changes throughout the brain appear unchanged. Electronically Signed    By: Nelson Chimes M.D.   On: 01/06/2018 17:30   Ct Head Code Stroke Wo Contrast  Result Date: 01/06/2018 CLINICAL DATA:  Code stroke.  Aphasia right facial droop EXAM: CT HEAD WITHOUT CONTRAST TECHNIQUE: Contiguous axial images were obtained from the base of the skull through the vertex without intravenous contrast. COMPARISON:  CT head 01/01/2018 FINDINGS: Brain: Generalized atrophy. Right cerebellar infarct shows progressive volume loss compared with the prior study. Mild chronic microvascular ischemia in the white matter. No acute cortically based infarct. Negative for hemorrhage or mass. Vascular: Negative for hyperdense vessel Skull: Negative Sinuses/Orbits: Paranasal sinuses clear.  Bilateral cataract surgery Other: None ASPECTS ([place] Stroke Program Early CT Score) - Ganglionic level infarction (caudate, lentiform nuclei, internal capsule, insula, M1-M3 cortex): 7 - Supraganglionic infarction (M4-M6 cortex): 3 Total score (0-10 with 10 being normal): 10 IMPRESSION: 1. Negative for acute infarct or hemorrhage 2. Contracting right cerebellar infarct which is a recent infarct as noted on recent studies. 3. ASPECTS is 10 4. These results were called by telephone at the time of interpretation on 01/06/2018 at 12:24 pm to Dr. Erlinda Hong , who verbally acknowledged these results. Electronically Signed   By: Franchot Gallo M.D.   On: 01/06/2018 12:24    Medications:   . apixaban  5 mg Oral BID  . atorvastatin  80 mg Oral QPM  . chlorhexidine  15 mL Mouth/Throat BID  . cholecalciferol  2,000 Units Oral Daily  . dorzolamide-timolol  1 drop Both Eyes BID  . FLUoxetine  20 mg Oral Daily  . furosemide  40 mg Oral BID  . latanoprost  1 drop Both Eyes QHS  . metoprolol succinate  25 mg Oral Daily  . pantoprazole  40 mg Oral Daily  . potassium chloride SA  40 mEq Oral BID   Continuous Infusions:   LOS: 0 days   Geradine Girt  [facility]   *Please refer to [email or web address], password [email or web address] to get updated  schedule on who will round on this patient, as hospitalists switch teams weekly. If 7PM-7AM, please contact night-coverage at [email or web address], password [email or web address] for any overnight needs.  01/07/2018, 11:28 AM

## 2018-01-07 NOTE — Progress Notes (Addendum)
STROKE TEAM PROGRESS NOTE   SUBJECTIVE (INTERVAL HISTORY) Her sister is at the bedside.  Overall she feels her condition is unchanged, but seems slight improvement of language output. She keeps saying "pick this up, please, come on". Following limited simple central commands. Pan CT no malignancy.    OBJECTIVE Temp:  [97.5 F (36.4 C)-98.2 F (36.8 C)] 97.5 F (36.4 C) (07/15 0904) Pulse Rate:  [60-85] 75 (07/15 1622) Cardiac Rhythm: Atrial fibrillation (07/15 0700) Resp:  [16-19] 18 (07/15 1622) BP: (114-142)/(71-99) 123/92 (07/15 1622) SpO2:  [73 %-94 %] 87 % (07/15 1622)  Recent Labs  Lab 01/03/18 1125 01/03/18 1555 01/03/18 2145 01/04/18 1108 01/06/18 1342  GLUCAP 118* 111* 117* 135* 106*   Recent Labs  Lab 01/01/18 1440 01/02/18 0551 01/03/18 0526 01/04/18 0426 01/06/18 1339 01/06/18 1353  NA 136 137 136 135 132* 134*  K 4.9 3.6 5.0 4.9 4.6 4.9  CL 104 103 105 105 100 100  CO2 21* 23 20* 24 24  --   GLUCOSE 85 101* 105* 102* 112* 111*  BUN 16 12 13 9 8 9   CREATININE 0.86 0.76 0.80 0.65 0.73 0.60  CALCIUM 9.2 9.0 9.0 9.1 9.3  --   MG  --  1.7  --   --   --   --    Recent Labs  Lab 01/01/18 1440 01/03/18 0526 01/06/18 1339  AST 29 31 24   ALT 27 30 32  ALKPHOS 43 41 64  BILITOT 1.4* 1.0 0.8  PROT 5.6* 5.3* 6.2*  ALBUMIN 3.3* 2.9* 3.1*   Recent Labs  Lab 01/01/18 1440 01/02/18 0551 01/06/18 1339 01/06/18 1353  WBC 7.5 7.5 10.5  --   NEUTROABS  --  4.7 7.4  --   HGB 13.8 13.4 15.6* 18.4*  HCT 45.2 44.3 50.8* 54.0*  MCV 81.6 81.7 82.3  --   PLT 245 252 345  --    Recent Labs  Lab 01/01/18 1440 01/01/18 1824 01/02/18 0551  TROPONINI 0.13* 0.11* 0.10*   Recent Labs    01/06/18 1339  LABPROT 20.8*  INR 1.81   No results for input(s): COLORURINE, LABSPEC, PHURINE, GLUCOSEU, HGBUR, BILIRUBINUR, KETONESUR, PROTEINUR, UROBILINOGEN, NITRITE, LEUKOCYTESUR in the last 72 hours.  Invalid input(s): APPERANCEUR     Component Value Date/Time    CHOL 135 01/07/2018 0742   TRIG 59 01/07/2018 0742   HDL 35 (L) 01/07/2018 0742   CHOLHDL 3.9 01/07/2018 0742   VLDL 12 01/07/2018 0742   LDLCALC 88 01/07/2018 0742   Lab Results  Component Value Date   HGBA1C 6.2 (H) 01/07/2018      Component Value Date/Time   LABOPIA NONE DETECTED 01/01/2018 1614   COCAINSCRNUR NONE DETECTED 01/01/2018 1614   LABBENZ NONE DETECTED 01/01/2018 1614   AMPHETMU NONE DETECTED 01/01/2018 1614   THCU NONE DETECTED 01/01/2018 1614   LABBARB (A) 01/01/2018 1614    Result not available. Reagent lot number recalled by manufacturer.    No results for input(s): ETH in the last 168 hours.  I have personally reviewed the radiological images below and agree with the radiology interpretations.  Ct Angio Head W Or Wo Contrast  Result Date: 01/06/2018 CLINICAL DATA:  Stroke.  Aphasia and right facial droop EXAM: CT ANGIOGRAPHY HEAD AND NECK TECHNIQUE: Multidetector CT imaging of the head and neck was performed using the standard protocol during bolus administration of intravenous contrast. Multiplanar CT image reconstructions and MIPs were obtained to evaluate the vascular anatomy. Carotid stenosis measurements (when  applicable) are obtained utilizing NASCET criteria, using the distal internal carotid diameter as the denominator. CONTRAST:  35mL ISOVUE-370 IOPAMIDOL (ISOVUE-370) INJECTION 76% COMPARISON:  CT head 01/06/2018 FINDINGS: CTA NECK FINDINGS Aortic arch: Minimal atherosclerotic disease aortic arch. Proximal great vessels widely patent. Left vertebral artery origin from the arch. Right carotid system: Normal right carotid. Negative for carotid stenosis or atherosclerotic disease. Left carotid system: Normal left carotid without stenosis or atherosclerotic disease Vertebral arteries: Both vertebral arteries widely patent without stenosis or atherosclerotic disease Skeleton: Negative Other neck: Negative for mass or adenopathy. Thyroid goiter with calcifications and  nodules. Upper chest: Apical emphysema and subpleural blebs. No acute infiltrate or mass. Postsurgical changes in left apex with resection. Review of the MIP images confirms the above findings CTA HEAD FINDINGS Anterior circulation: Mild atherosclerotic disease in the cavernous carotid bilaterally without significant stenosis. Anterior and middle cerebral arteries are patent bilaterally without occlusion. Small inferior branch of the middle cerebral artery bilaterally. Intracranial vessels are diffusely small but without focal stenosis. Posterior circulation: Both vertebral arteries patent to the basilar. Basilar widely patent. PICA, superior cerebellar, posterior cerebral arteries patent bilaterally. Posterior communicating artery patent bilaterally. Venous sinuses: Negative Anatomic variants: None Delayed phase: Not perform Review of the MIP images confirms the above findings IMPRESSION: 1. Negative CTA of the neck.  No carotid or vertebral stenosis 2. No significant intracranial stenosis. No emergent large vessel occlusion. Relatively small caliber intracranial circulation. 3. Thyroid goiter Electronically Signed   By: Franchot Gallo M.D.   On: 01/06/2018 13:22   Ct Angio Head W Or Wo Contrast  Result Date: 01/01/2018 CLINICAL DATA:  Follow-up cerebellar infarct. History of hypertension, hyperlipidemia. EXAM: CT ANGIOGRAPHY HEAD AND NECK TECHNIQUE: Multidetector CT imaging of the head and neck was performed using the standard protocol during bolus administration of intravenous contrast. Multiplanar CT image reconstructions and MIPs were obtained to evaluate the vascular anatomy. Carotid stenosis measurements (when applicable) are obtained utilizing NASCET criteria, using the distal internal carotid diameter as the denominator. CONTRAST:  59mL ISOVUE-370 IOPAMIDOL (ISOVUE-370) INJECTION 76% COMPARISON:  MRI and CT HEAD January 01, 2018. FINDINGS: CTA NECK FINDINGS: AORTIC ARCH: Common origin of the innominate and  LEFT common carotid artery. LEFT vertebral artery arises directly from the aortic arch. Arch vessel origins are patent. Mild calcific atherosclerosis aortic arch. The origins of the innominate, left Common carotid artery and subclavian artery are widely patent. RIGHT CAROTID SYSTEM: Common carotid artery is patent. Normal appearance of the carotid bifurcation without hemodynamically significant stenosis by NASCET criteria. Normal appearance of the internal carotid artery. LEFT CAROTID SYSTEM: Common carotid artery is patent. Normal appearance of the carotid bifurcation without hemodynamically significant stenosis by NASCET criteria. Normal appearance of the internal carotid artery. VERTEBRAL ARTERIES:RIGHT vertebral artery is dominant. Normal appearance of the vertebral arteries, widely patent. SKELETON: No acute osseous process though bone windows have not been submitted. OTHER NECK: Soft tissues of the neck are nonacute though, not tailored for evaluation. Thyromegaly with scattered calcifications, no dominant nodule. Hypoplastic mandible with hardware. UPPER CHEST: Severe centrilobular and paraseptal emphysema. LEFT upper chest wall deformity. LEFT calcified pleural plaque. CTA HEAD FINDINGS: ANTERIOR CIRCULATION: Patent cervical internal carotid arteries, petrous, cavernous and supra clinoid internal carotid arteries. Moderate stenosis RIGHT supraclinoid ICA. Patent anterior communicating artery. Patent anterior and middle cerebral arteries, mild luminal irregularity compatible with atherosclerosis. Long segment moderate stenosis RIGHT M1 segment. No large vessel occlusion, significant stenosis, contrast extravasation or aneurysm. POSTERIOR CIRCULATION: Patent vertebral arteries, vertebrobasilar junction  and basilar artery, as well as main branch vessels. Patent RIGHT PICA. Patent posterior cerebral arteries. Robust LEFT posterior communicating artery. No large vessel occlusion, significant stenosis, contrast  extravasation or aneurysm. VENOUS SINUSES: Major dural venous sinuses are patent though not tailored for evaluation on this angiographic examination. ANATOMIC VARIANTS: Hypoplastic LEFT P1 segment. DELAYED PHASE: Not performed. MIP images reviewed. IMPRESSION: CTA NECK: 1. No hemodynamically significant stenosis ICA. Patent vertebral arteries. CTA HEAD: 1. No emergent large vessel occlusion or flow-limiting stenosis. 2. Moderate stenosis RIGHT supraclinoid ICA and RIGHT M1 segment. Emphysema (ICD10-J43.9).  Aortic Atherosclerosis (ICD10-I70.0). Electronically Signed   By: Elon Alas M.D.   On: 01/01/2018 22:57   Dg Chest 2 View  Result Date: 01/01/2018 CLINICAL DATA:  Chest pain EXAM: CHEST - 2 VIEW COMPARISON:  07/27/2016 FINDINGS: AP and lateral views of the chest show a hyperexpansion. The cardio pericardial silhouette is enlarged. Left base collapse/consolidation is associated with small left pleural effusion. May be some volume loss in the left hemithorax is well. Staple line again noted at the left apex. Bones are diffusely demineralized. Thoracolumbar scoliosis again noted. Telemetry leads overlie the chest. IMPRESSION: Retrocardiac left base collapse/consolidation with possible volume loss in the left hemithorax. Tiny left pleural effusion. Electronically Signed   By: Misty Stanley M.D.   On: 01/01/2018 15:06   Ct Head Wo Contrast  Result Date: 01/01/2018 CLINICAL DATA:  Generalized weakness for 3 days. Recent diagnosis of atrial fibrillation. EXAM: CT HEAD WITHOUT CONTRAST TECHNIQUE: Contiguous axial images were obtained from the base of the skull through the vertex without intravenous contrast. COMPARISON:  None. FINDINGS: Brain: No subdural, epidural, or subarachnoid hemorrhage. There is a rounded region of low attenuation in the right cerebellar hemisphere with mild regional mass effect. No associated hemorrhage. The remainder of the cerebellum is normal. The brainstem and basal cisterns are  normal. Ventricles and sulci are unremarkable. No acute cortical ischemia or infarct otherwise seen. No midline shift. Vascular: Calcified atherosclerosis in the intracranial carotids. Skull: Normal. Negative for fracture or focal lesion. Sinuses/Orbits: No acute finding. Other: None. IMPRESSION: 1. Rounded region of low attenuation in the right cerebellar hemisphere with mild regional mass effect but no associated bleed is most consistent with an infarct, acute to subacute. 2. No other acute abnormalities. Findings called to Dr. Alvino Chapel. Electronically Signed   By: Dorise Bullion III M.D   On: 01/01/2018 15:31   Ct Angio Neck W Or Wo Contrast  Result Date: 01/06/2018 CLINICAL DATA:  Stroke.  Aphasia and right facial droop EXAM: CT ANGIOGRAPHY HEAD AND NECK TECHNIQUE: Multidetector CT imaging of the head and neck was performed using the standard protocol during bolus administration of intravenous contrast. Multiplanar CT image reconstructions and MIPs were obtained to evaluate the vascular anatomy. Carotid stenosis measurements (when applicable) are obtained utilizing NASCET criteria, using the distal internal carotid diameter as the denominator. CONTRAST:  60mL ISOVUE-370 IOPAMIDOL (ISOVUE-370) INJECTION 76% COMPARISON:  CT head 01/06/2018 FINDINGS: CTA NECK FINDINGS Aortic arch: Minimal atherosclerotic disease aortic arch. Proximal great vessels widely patent. Left vertebral artery origin from the arch. Right carotid system: Normal right carotid. Negative for carotid stenosis or atherosclerotic disease. Left carotid system: Normal left carotid without stenosis or atherosclerotic disease Vertebral arteries: Both vertebral arteries widely patent without stenosis or atherosclerotic disease Skeleton: Negative Other neck: Negative for mass or adenopathy. Thyroid goiter with calcifications and nodules. Upper chest: Apical emphysema and subpleural blebs. No acute infiltrate or mass. Postsurgical changes in left  apex  with resection. Review of the MIP images confirms the above findings CTA HEAD FINDINGS Anterior circulation: Mild atherosclerotic disease in the cavernous carotid bilaterally without significant stenosis. Anterior and middle cerebral arteries are patent bilaterally without occlusion. Small inferior branch of the middle cerebral artery bilaterally. Intracranial vessels are diffusely small but without focal stenosis. Posterior circulation: Both vertebral arteries patent to the basilar. Basilar widely patent. PICA, superior cerebellar, posterior cerebral arteries patent bilaterally. Posterior communicating artery patent bilaterally. Venous sinuses: Negative Anatomic variants: None Delayed phase: Not perform Review of the MIP images confirms the above findings IMPRESSION: 1. Negative CTA of the neck.  No carotid or vertebral stenosis 2. No significant intracranial stenosis. No emergent large vessel occlusion. Relatively small caliber intracranial circulation. 3. Thyroid goiter Electronically Signed   By: Franchot Gallo M.D.   On: 01/06/2018 13:22   Ct Angio Neck W Or Wo Contrast  Result Date: 01/01/2018 CLINICAL DATA:  Follow-up cerebellar infarct. History of hypertension, hyperlipidemia. EXAM: CT ANGIOGRAPHY HEAD AND NECK TECHNIQUE: Multidetector CT imaging of the head and neck was performed using the standard protocol during bolus administration of intravenous contrast. Multiplanar CT image reconstructions and MIPs were obtained to evaluate the vascular anatomy. Carotid stenosis measurements (when applicable) are obtained utilizing NASCET criteria, using the distal internal carotid diameter as the denominator. CONTRAST:  45mL ISOVUE-370 IOPAMIDOL (ISOVUE-370) INJECTION 76% COMPARISON:  MRI and CT HEAD January 01, 2018. FINDINGS: CTA NECK FINDINGS: AORTIC ARCH: Common origin of the innominate and LEFT common carotid artery. LEFT vertebral artery arises directly from the aortic arch. Arch vessel origins are patent.  Mild calcific atherosclerosis aortic arch. The origins of the innominate, left Common carotid artery and subclavian artery are widely patent. RIGHT CAROTID SYSTEM: Common carotid artery is patent. Normal appearance of the carotid bifurcation without hemodynamically significant stenosis by NASCET criteria. Normal appearance of the internal carotid artery. LEFT CAROTID SYSTEM: Common carotid artery is patent. Normal appearance of the carotid bifurcation without hemodynamically significant stenosis by NASCET criteria. Normal appearance of the internal carotid artery. VERTEBRAL ARTERIES:RIGHT vertebral artery is dominant. Normal appearance of the vertebral arteries, widely patent. SKELETON: No acute osseous process though bone windows have not been submitted. OTHER NECK: Soft tissues of the neck are nonacute though, not tailored for evaluation. Thyromegaly with scattered calcifications, no dominant nodule. Hypoplastic mandible with hardware. UPPER CHEST: Severe centrilobular and paraseptal emphysema. LEFT upper chest wall deformity. LEFT calcified pleural plaque. CTA HEAD FINDINGS: ANTERIOR CIRCULATION: Patent cervical internal carotid arteries, petrous, cavernous and supra clinoid internal carotid arteries. Moderate stenosis RIGHT supraclinoid ICA. Patent anterior communicating artery. Patent anterior and middle cerebral arteries, mild luminal irregularity compatible with atherosclerosis. Long segment moderate stenosis RIGHT M1 segment. No large vessel occlusion, significant stenosis, contrast extravasation or aneurysm. POSTERIOR CIRCULATION: Patent vertebral arteries, vertebrobasilar junction and basilar artery, as well as main branch vessels. Patent RIGHT PICA. Patent posterior cerebral arteries. Robust LEFT posterior communicating artery. No large vessel occlusion, significant stenosis, contrast extravasation or aneurysm. VENOUS SINUSES: Major dural venous sinuses are patent though not tailored for evaluation on  this angiographic examination. ANATOMIC VARIANTS: Hypoplastic LEFT P1 segment. DELAYED PHASE: Not performed. MIP images reviewed. IMPRESSION: CTA NECK: 1. No hemodynamically significant stenosis ICA. Patent vertebral arteries. CTA HEAD: 1. No emergent large vessel occlusion or flow-limiting stenosis. 2. Moderate stenosis RIGHT supraclinoid ICA and RIGHT M1 segment. Emphysema (ICD10-J43.9).  Aortic Atherosclerosis (ICD10-I70.0). Electronically Signed   By: Elon Alas M.D.   On: 01/01/2018 22:57   Ct Chest W  Contrast  Result Date: 01/07/2018 CLINICAL DATA:  77 y.o. African American female with PMH of HTN, HLD, LVHP, AF on eliquis, hypothyroidism, recent right PICA stroke and loop placed for syncope presented as code stroke. CT use ordered to evaluate for underlying malignancy given recurrent strokes on anticoagulation. EXAM: CT CHEST, ABDOMEN, AND PELVIS WITH CONTRAST TECHNIQUE: Multidetector CT imaging of the chest, abdomen and pelvis was performed following the standard protocol during bolus administration of intravenous contrast. CONTRAST:  133mL OMNIPAQUE IOHEXOL 300 MG/ML  SOLN COMPARISON:  Chest radiographs 01/01/2018. FINDINGS: CT CHEST FINDINGS Cardiovascular: There is atherosclerosis of the aorta, great vessels and coronary arteries. No evidence of acute pulmonary embolism. The central pulmonary arteries are enlarged, consistent with pulmonary arterial hypertension. The heart is enlarged, especially both atria. There is no pericardial effusion. Mediastinum/Nodes: There are no enlarged mediastinal, hilar or axillary lymph nodes. The thyroid gland is mildly enlarged and heterogeneous, most consistent with a goiter. There is some fluid in the esophagus which is not significantly distended. Lungs/Pleura: No significant pleural effusion. There is pleural thickening and calcification posteriorly in the left hemithorax. There is moderate centrilobular and paraseptal emphysema. Patient is status post left  thoracotomy, and surgical clips are present at the left apex. There is scattered subpleural scarring throughout the left lung. No suspicious pulmonary nodule. Musculoskeletal/Chest wall: Left thoracotomy defect with associated rib deformities. No chest wall mass or suspicious osseous findings. Mild edema throughout the soft tissues. CT ABDOMEN AND PELVIS FINDINGS Hepatobiliary: No focal hepatic abnormality. There is prominent reflux of contrast from the right atrium into the IVC and hepatic veins. There is increased density within the gallbladder lumen, likely vicarious excretion of contrast. No evidence of gallstones, gallbladder wall thickening or biliary dilatation. Pancreas: Unremarkable. No pancreatic ductal dilatation or surrounding inflammatory changes. Spleen: Normal in size without focal abnormality. Adrenals/Urinary Tract: Both adrenal glands appear normal. There is cortical scarring in the lower pole of the right kidney. No evidence of urinary tract calculus, renal mass or hydronephrosis. There is contrast material in the bladder which otherwise appears unremarkable. Stomach/Bowel: The stomach and small bowel appear normal. There is mild wall thickening throughout the proximal colon. No evidence of bowel obstruction. The appendix appears normal. Vascular/Lymphatic: There are no enlarged abdominal or pelvic lymph nodes. There is extensive aortic and branch vessel atherosclerosis with a long segment occlusion of the right external iliac artery. There is distal reconstitution of the right femoral artery. Reproductive: Hysterectomy.  No adnexal mass. Other: There is a small amount of pelvic and right pericolic gutter ascites. There is generalized edema throughout the subcutaneous and intra-abdominal fat. Musculoskeletal: No acute or significant osseous findings. Mild degenerative changes in the spine. IMPRESSION: 1. No evidence of malignancy within the chest, abdomen or pelvis. 2. Anasarca with generalized  soft tissue edema and ascites. This may account for proximal colonic wall thickening. Alternatively, this could reflect colitis. 3. Cardiomegaly with prominent reflux of contrast into the IVC and hepatic veins consistent with right heart failure. Central enlargement of the pulmonary arteries consistent with pulmonary arterial hypertension. 4. Aortic Atherosclerosis (ICD10-I70.0). Long segment occlusion of the right common iliac artery with distal reconstitution. 5. Possible posttraumatic and postsurgical changes in the left hemithorax with associated pleural calcification and subpleural scarring. Emphysema (ICD10-J43.9). Electronically Signed   By: Richardean Sale M.D.   On: 01/07/2018 16:05   Mr Brain Wo Contrast  Result Date: 01/06/2018 CLINICAL DATA:  Followup stroke. Recent development of aphasia and right facial droop. Previous right PICA  stroke. EXAM: MRI HEAD WITHOUT CONTRAST TECHNIQUE: Multiplanar, multiecho pulse sequences of the brain and surrounding structures were obtained without intravenous contrast. COMPARISON:  Multiple CT examinations same day. CT and MRI examinations 01/01/2018 FINDINGS: Brain: Diffusion imaging again shows of all of inguinal far shin in the right posterior inferior cerebellar artery territory. No extension in that region. New subcentimeter acute infarction in the left cerebellum without swelling or hemorrhage. New 5 cm region of acute infarction in the left MCA territory affecting the deep insula and left parietal cortical and subcortical brain consistent with left MCA branch vessel occlusion. No swelling or hemorrhage. Small of all vein left posterior frontal cortical infarction since the previous exam. Elsewhere, there is chronic small-vessel ischemic change affecting the pons and cerebellar hemispheres. Cerebral hemispheres elsewhere show chronic small-vessel ischemic changes of the deep and subcortical white matter and some old small vessel infarctions of the thalami and  basal ganglia. No hydrocephalus. No extra-axial collection. Vascular: Major vessels at the base of the brain show flow. Skull and upper cervical spine: Negative Sinuses/Orbits: Clear/normal Other: None IMPRESSION: New 5 cm region of acute infarction in the left deep insula and parietal lobe consistent with MCA branch vessel occlusion. New 1 cm acute infarction in the left cerebellum. Expected evolutionary changes within the right posterior inferior cerebellar artery territory stroke shown previously. No significant swelling or hemorrhage. Redemonstration of a minimal left posterior frontal cortical infarction without swelling or hemorrhage. Multiple vascular territory infarctions suggest embolic disease from the heart or ascending aorta. Chronic small-vessel ischemic changes throughout the brain appear unchanged. Electronically Signed   By: Nelson Chimes M.D.   On: 01/06/2018 17:30   Mr Brain Wo Contrast  Result Date: 01/01/2018 CLINICAL DATA:  Generalized weakness, LEFT facial droop for a few days. Follow up stroke. History of hypertension, hyperlipidemia. EXAM: MRI HEAD WITHOUT CONTRAST TECHNIQUE: Multiplanar, multiecho pulse sequences of the brain and surrounding structures were obtained without intravenous contrast. COMPARISON:  CT HEAD January 01, 2018 FINDINGS: INTRACRANIAL CONTENTS: Wedge-like reduced diffusion RIGHT inferior cerebellum with low to normalized ADC values and RIGHT FLAIR signal. A few punctate foci reduced diffusion RIGHT mid cerebellum. Regional mass effect without midline shift. Old small bilateral cerebellar infarcts. Chronic microhemorrhage LEFT occipital lobe without susceptibility artifact to suggest acute blood products. Old LEFT basal ganglia lacunar infarct. Prominent basal ganglia and thalami perivascular spaces associated chronic small vessel ischemic changes. Patchy supratentorial and pontine white matter FLAIR T2 hyperintensities compatible with chronic small vessel ischemic changes,  normal for age. No parenchymal brain volume loss for age. No hydrocephalus. No abnormal extra-axial fluid collections. VASCULAR: Normal major intracranial vascular flow voids present at skull base. SKULL AND UPPER CERVICAL SPINE: No abnormal sellar expansion. No suspicious calvarial bone marrow signal. Craniocervical junction maintained. SINUSES/ORBITS: The mastoid air-cells and included paranasal sinuses are well-aerated.The included ocular globes and orbital contents are non-suspicious. Status post bilateral ocular lens implants. OTHER: None. IMPRESSION: 1. Acute nonhemorrhagic RIGHT cerebellar/PICA territory infarct. 2. Old small bilateral cerebellar and LEFT basal ganglia infarcts. Mild chronic small vessel ischemic changes. Electronically Signed   By: Elon Alas M.D.   On: 01/01/2018 22:13   Ct Abdomen Pelvis W Contrast  Result Date: 01/07/2018 CLINICAL DATA:  77 y.o. African American female with PMH of HTN, HLD, LVHP, AF on eliquis, hypothyroidism, recent right PICA stroke and loop placed for syncope presented as code stroke. CT use ordered to evaluate for underlying malignancy given recurrent strokes on anticoagulation. EXAM: CT CHEST, ABDOMEN, AND PELVIS  WITH CONTRAST TECHNIQUE: Multidetector CT imaging of the chest, abdomen and pelvis was performed following the standard protocol during bolus administration of intravenous contrast. CONTRAST:  118mL OMNIPAQUE IOHEXOL 300 MG/ML  SOLN COMPARISON:  Chest radiographs 01/01/2018. FINDINGS: CT CHEST FINDINGS Cardiovascular: There is atherosclerosis of the aorta, great vessels and coronary arteries. No evidence of acute pulmonary embolism. The central pulmonary arteries are enlarged, consistent with pulmonary arterial hypertension. The heart is enlarged, especially both atria. There is no pericardial effusion. Mediastinum/Nodes: There are no enlarged mediastinal, hilar or axillary lymph nodes. The thyroid gland is mildly enlarged and heterogeneous, most  consistent with a goiter. There is some fluid in the esophagus which is not significantly distended. Lungs/Pleura: No significant pleural effusion. There is pleural thickening and calcification posteriorly in the left hemithorax. There is moderate centrilobular and paraseptal emphysema. Patient is status post left thoracotomy, and surgical clips are present at the left apex. There is scattered subpleural scarring throughout the left lung. No suspicious pulmonary nodule. Musculoskeletal/Chest wall: Left thoracotomy defect with associated rib deformities. No chest wall mass or suspicious osseous findings. Mild edema throughout the soft tissues. CT ABDOMEN AND PELVIS FINDINGS Hepatobiliary: No focal hepatic abnormality. There is prominent reflux of contrast from the right atrium into the IVC and hepatic veins. There is increased density within the gallbladder lumen, likely vicarious excretion of contrast. No evidence of gallstones, gallbladder wall thickening or biliary dilatation. Pancreas: Unremarkable. No pancreatic ductal dilatation or surrounding inflammatory changes. Spleen: Normal in size without focal abnormality. Adrenals/Urinary Tract: Both adrenal glands appear normal. There is cortical scarring in the lower pole of the right kidney. No evidence of urinary tract calculus, renal mass or hydronephrosis. There is contrast material in the bladder which otherwise appears unremarkable. Stomach/Bowel: The stomach and small bowel appear normal. There is mild wall thickening throughout the proximal colon. No evidence of bowel obstruction. The appendix appears normal. Vascular/Lymphatic: There are no enlarged abdominal or pelvic lymph nodes. There is extensive aortic and branch vessel atherosclerosis with a long segment occlusion of the right external iliac artery. There is distal reconstitution of the right femoral artery. Reproductive: Hysterectomy.  No adnexal mass. Other: There is a small amount of pelvic and  right pericolic gutter ascites. There is generalized edema throughout the subcutaneous and intra-abdominal fat. Musculoskeletal: No acute or significant osseous findings. Mild degenerative changes in the spine. IMPRESSION: 1. No evidence of malignancy within the chest, abdomen or pelvis. 2. Anasarca with generalized soft tissue edema and ascites. This may account for proximal colonic wall thickening. Alternatively, this could reflect colitis. 3. Cardiomegaly with prominent reflux of contrast into the IVC and hepatic veins consistent with right heart failure. Central enlargement of the pulmonary arteries consistent with pulmonary arterial hypertension. 4. Aortic Atherosclerosis (ICD10-I70.0). Long segment occlusion of the right common iliac artery with distal reconstitution. 5. Possible posttraumatic and postsurgical changes in the left hemithorax with associated pleural calcification and subpleural scarring. Emphysema (ICD10-J43.9). Electronically Signed   By: Richardean Sale M.D.   On: 01/07/2018 16:05   Nm Pulmonary Perf And Vent  Result Date: 01/02/2018 CLINICAL DATA:  Short of breath. Heart failure. Pulmonary hypertension. EXAM: NUCLEAR MEDICINE VENTILATION - PERFUSION LUNG SCAN TECHNIQUE: Ventilation images were obtained in multiple projections using inhaled aerosol Tc-70m DTPA. Perfusion images were obtained in multiple projections after intravenous injection of Tc-6m-MAA. RADIOPHARMACEUTICALS:  31.7 mCi of Tc-57m DTPA aerosol inhalation and 4.2 mCi Tc36m-MAA IV COMPARISON:  None. FINDINGS: Ventilation: No focal ventilation defect. Small peripheral ventilation defects  in LEFT and RIGHT lung. Perfusion: No wedge shaped peripheral perfusion defects to suggest acute pulmonary embolism. IMPRESSION: 1. No evidence of acute pulmonary embolism. 2. Peripheral ventilation defects consistent with COPD/emphysema Electronically Signed   By: Suzy Bouchard M.D.   On: 01/02/2018 15:59   Ct Head Code Stroke Wo  Contrast  Result Date: 01/06/2018 CLINICAL DATA:  Code stroke.  Aphasia right facial droop EXAM: CT HEAD WITHOUT CONTRAST TECHNIQUE: Contiguous axial images were obtained from the base of the skull through the vertex without intravenous contrast. COMPARISON:  CT head 01/01/2018 FINDINGS: Brain: Generalized atrophy. Right cerebellar infarct shows progressive volume loss compared with the prior study. Mild chronic microvascular ischemia in the white matter. No acute cortically based infarct. Negative for hemorrhage or mass. Vascular: Negative for hyperdense vessel Skull: Negative Sinuses/Orbits: Paranasal sinuses clear.  Bilateral cataract surgery Other: None ASPECTS ([place] Stroke Program Early CT Score) - Ganglionic level infarction (caudate, lentiform nuclei, internal capsule, insula, M1-M3 cortex): 7 - Supraganglionic infarction (M4-M6 cortex): 3 Total score (0-10 with 10 being normal): 10 IMPRESSION: 1. Negative for acute infarct or hemorrhage 2. Contracting right cerebellar infarct which is a recent infarct as noted on recent studies. 3. ASPECTS is 10 4. These results were called by telephone at the time of interpretation on 01/06/2018 at 12:24 pm to Dr. Erlinda Hong , who verbally acknowledged these results. Electronically Signed   By: Franchot Gallo M.D.   On: 01/06/2018 12:24   TTE  - Left ventricle: LVEF is vigorous with midcavity obliteration   during systole The cavity size was normal. Wall thickness was   increased in a pattern of moderate LVH. Systolic function was   vigorous. The estimated ejection fraction was in the range of 65%   to 70%. - Aortic valve: There was mild regurgitation. - Mitral valve: Calcified annulus. Mildly thickened leaflets .   There was mild regurgitation. - Left atrium: The atrium was severely dilated. - Right atrium: The atrium was moderately to severely dilated. - Pulmonary arteries: PA peak pressure: 93 mm Hg (S). - Pericardium, extracardiac: A trivial pericardial  effusion was   identified.  EEG Consistent with a mild generalized non-specific cerebral dysfunction(encephalopathy). There was no seizure or seizure predisposition recorded on this study. Please note that a normal EEG does not preclude the possibility of epilepsy.     PHYSICAL EXAM  Temp:  [97.5 F (36.4 C)-98.2 F (36.8 C)] 97.5 F (36.4 C) (07/15 0904) Pulse Rate:  [60-85] 75 (07/15 1622) Resp:  [16-19] 18 (07/15 1622) BP: (114-142)/(71-99) 123/92 (07/15 1622) SpO2:  [73 %-94 %] 87 % (07/15 1622)  General - cachectic, well developed, in no apparent distress.    Ophthalmologic - fundi not visualized due to noncooperation.    Cardiovascular - irregularly irregular heart rate and rhythm.  Neuro - awake alert, global aphasia, perseverated on "pick this up please come on", but not meaningful. Follows simple central commands to open and close eyes as well as lateral gaze, but not following peripheral commands, not able to name or repeat. PERRL, left gaze preference, but able to cross midline, blinking to visual threat on the left, but not on the right. Left facial droop. Tongue midline in mouth. Moving all extremities symmetrically, BUE 4-/5 and BLE 3-/5. DTR 1+ and no babinski. Sensation, coordination and gait not tested.   ASSESSMENT/PLAN Ms. Stephanie Simmons is a 77 y.o. female with history of HTN, HLD, AF on eliquis, hypothyroidism, recent right PICA stroke and loop placed for syncope  admitted for acute aphasia, left gaze preference and right neglect as well as focal seizure. No tPA given due to on eliquis.    Stroke: left MCA and left cerebellum acute infarcts as well as recent right cerebellum infarct, embolic secondary to Afib  Resultant global aphasia, right neglect  MRI left MCA and left cerebellum acute infarct, subacute right cerebellar infarct   CTA head and neck Moderate stenosis RIGHT supraclinoid ICA and RIGHT M1 segment  Pan CT - no malignancy   2D Echo  EF  65-70%  LDL 94  HgbA1c 6.2  eliquis for VTE prophylaxis  Eliquis (apixaban) daily prior to admission, now on Eliquis (apixaban) daily.   Patient counseled to be compliant with her antithrombotic medications  Ongoing aggressive stroke risk factor management  Therapy recommendations:  SNF  Disposition:  Pending  Atrial Fibrillation  Home anticoagulation:  Eliquis (apixaban) daily continued in the hospital  Continue Eliquis (apixaban) daily at discharge (see above)  Follow up with cardiology  Simple partial seizure  enroute to [facility], pt had witness right facial twitching for less than 73min  Likely related to left MCA infarct  No recurrence  EEG no seizure  Do not feel AED needed at this time, however, if recurrence, will recommend AED  Hypertension  Stable  Permissive hypertension (OK if < 220/120) but gradually normalize in 5-7 days  Long-term BP goal normotensive  Hyperlipidemia  Home meds:  lipitor 80  Continue lipitor to 80 on discharge  LDL 94, goal < 70  Other Stroke Risk Factors  Advanced age  Family hx stroke (sister)   Congestive heart failure  Other Active Problems  Hypothyroidism  Has Loop recorder, placed for syncope   Hospital day # 0  Neurology will sign off. Please call with questions. Pt will follow up with stroke clinic NP at [facility] in about 4 weeks. Thanks for the consult.   Rosalin Hawking, MD PhD Stroke Neurology 01/07/2018 5:01 PM    To contact Stroke Continuity provider, please refer to [email or web address] After hours, contact General Neurology

## 2018-01-07 NOTE — Evaluation (Signed)
Occupational Therapy Evaluation Patient Details Name: Stephanie Simmons MRN: [id number] DOB: 12-Jun-1941 Today's Date: 01/07/2018    History of Present Illness Patient is a 77 y/o female presenting with aphasia, L gaze preference, facial droop and mild R arm drift. Currently undergoing neuro work-up. Of note, patient discharged from [facility] on 01/04/18 after a nonhemorrhagic right sella Beller/PICA territory infarction. Patient with a PMH significant for hypertension, hyperlipidemia, CHF, atrial fibrillation anticoagulated on Eliquis.    Clinical Impression   Pt admitted with above. She demonstrates the below listed deficits and will benefit from continued OT to maximize safety and independence with BADLs.   Pt presents to OT with significant communication deficits, impaired balance, generalized weakness, and decreased activity tolerance.  She was able to follow one step commands inconsistently in the context of functional activity.  She was noted to perseverate verbally.   She currently requires total A for ADLs, and mod A for functional transfers.   She recently discharged to SNF for rehab, but prior to that was independent with ADLs, per sister.  Recommend return to SNF for rehab.        Follow Up Recommendations  SNF    Equipment Recommendations  None recommended by OT    Recommendations for Other Services       Precautions / Restrictions Precautions Precautions: Fall Restrictions Weight Bearing Restrictions: No      Mobility Bed Mobility Overal bed mobility: Needs Assistance Bed Mobility: Supine to Sit;Sit to Supine     Supine to sit: Min assist Sit to supine: Min guard   General bed mobility comments: assist to guide LEs off bed and to lift trunk.  She spontaneously moved herself back to supine    Transfers Overall transfer level: Needs assistance Equipment used: Rolling walker (2 wheeled) Transfers: Sit to/from Stand Sit to Stand: Min assist         General transfer  comment: did not attempt due to incontinence     Balance Overall balance assessment: Needs assistance Sitting-balance support: Feet supported;Bilateral upper extremity supported Sitting balance-Leahy Scale: Fair                                     ADL either performed or assessed with clinical judgement   ADL Overall ADL's : Needs assistance/impaired Eating/Feeding: NPO   Grooming: Wash/dry hands;Wash/dry face;Oral care;Total assistance;Sitting Grooming Details (indicate cue type and reason): Pt makes no attempt to engage in ADLs despite cues and hand over hand assist.  She resists attempts to brush teeth.  However, she was able to spontaneously put in her dentures  Upper Body Bathing: Total assistance;Sitting   Lower Body Bathing: Total assistance;Bed level   Upper Body Dressing : Total assistance;Sitting   Lower Body Dressing: Total assistance;Bed level       Toileting- Clothing Manipulation and Hygiene: +2 for physical assistance;Bed level Toileting - Clothing Manipulation Details (indicate cue type and reason): Pt perseverating saying "take this off now please", then returned herself to supine form seated position, and pushed on her diaper.  She was found to have had a BM.   She was able to assist with rolling, and bridging for peri care, and consistently followed commands in the context of this activity              Vision Baseline Vision/History: Wears glasses Wears Glasses: At all times Additional Comments: Pt unable to participate in visual assessment  Perception Perception Perception Tested?: No Comments: unable to accurately assess due to severity of communication deficits.  She does seem to have a Rt gaze preference    Praxis Praxis Praxis tested?: Not tested Praxis-Other Comments: unable to accurately assess due to severity of communication deficits     Pertinent Vitals/Pain Pain Assessment: Faces Faces Pain Scale: Hurts a little bit      Hand Dominance Right   Extremity/Trunk Assessment Upper Extremity Assessment Upper Extremity Assessment: Generalized weakness(Pt actively moving bil. UEs )   Lower Extremity Assessment Lower Extremity Assessment: Defer to PT evaluation   Cervical / Trunk Assessment Cervical / Trunk Assessment: Kyphotic   Communication Communication Communication: HOH   Cognition Arousal/Alertness: Awake/alert Behavior During Therapy: Restless;Impulsive Overall Cognitive Status: Difficult to assess Area of Impairment: Orientation;Attention;Following commands;Safety/judgement;Problem solving                 Orientation Level: Disoriented to;Place;Time;Situation Current Attention Level: Sustained   Following Commands: Follows one step commands inconsistently;Follows one step commands with increased time Safety/Judgement: Decreased awareness of safety;Decreased awareness of deficits   Problem Solving: Slow processing;Decreased initiation;Difficulty sequencing;Requires verbal cues;Requires tactile cues General Comments: Pt with significant language deficits.  She will follow commands in the context of funtion intermittently    General Comments  sister present     Exercises     Shoulder Instructions      Home Living Family/patient expects to be discharged to:: Skilled nursing facility   Available Help at Discharge: [place]                             Additional Comments: Pt had just recently discharged to SNF for rehab due to stroke. Prior to that, she lived alone       Prior Functioning/Environment Level of Independence: Needs assistance        Comments: family reporting that after last discharge patient went to [facility] - had not yet been evaluated by therapy and was often placed in w/c; PT note from last admission, patient ambulating short distances with Min A with RW        OT Problem List: Decreased strength;Decreased activity  tolerance;Impaired balance (sitting and/or standing);Impaired vision/perception;Decreased cognition;Decreased safety awareness;Decreased knowledge of use of DME or AE;Impaired UE functional use      OT Treatment/Interventions: Self-care/ADL training;Neuromuscular education;DME and/or AE instruction;Therapeutic activities;Cognitive remediation/compensation;Visual/perceptual remediation/compensation;Patient/family education;Balance training    OT Goals(Current goals can be found in the care plan section) Acute Rehab OT Goals Patient Stated Goal: none stated OT Goal Formulation: Patient unable to participate in goal setting Time For Goal Achievement: 01/16/18 Potential to Achieve Goals: Good  OT Frequency: Min 2X/week   Barriers to D/C: Decreased caregiver support          Co-evaluation              AM-PAC PT "6 Clicks" Daily Activity     Outcome Measure Help from another person eating meals?: Total Help from another person taking care of personal grooming?: Total Help from another person toileting, which includes using toliet, bedpan, or urinal?: A Lot Help from another person bathing (including washing, rinsing, drying)?: Total Help from another person to put on and taking off regular upper body clothing?: Total Help from another person to put on and taking off regular lower body clothing?: Total 6 Click Score: 7   End of Session Nurse Communication: Mobility status  Activity Tolerance: Patient tolerated treatment well  Patient left: in bed;with call bell/phone within reach;with bed alarm set  OT Visit Diagnosis: Unsteadiness on feet (R26.81);Cognitive communication deficit (R41.841) Symptoms and signs involving cognitive functions: Cerebral infarction                Time: 1115-1150 OT Time Calculation (min): 35 min Charges:  OT General Charges $OT Visit: 1 Visit OT Evaluation $OT Eval Moderate Complexity: 1 Mod OT Treatments $Self Care/Home Management : 8-22  mins G-Codes:     [facility], OTR/L [phone]   Lucille Passy M 01/07/2018, 1:25 PM

## 2018-01-07 NOTE — Evaluation (Signed)
Physical Therapy Evaluation Patient Details Name: Stephanie Simmons MRN: [id number] DOB: [birth year] Today's Date: 01/07/2018   History of Present Illness  Patient is a 77 y/o female presenting with aphasia, L gaze preference, facial droop and mild R arm drift. Currently undergoing neuro work-up. Of note, patient discharged from [facility] on 01/04/18 after a nonhemorrhagic right sella Stephanie Simmons/PICA territory infarction. Patient with a PMH significant for hypertension, hyperlipidemia, CHF, atrial fibrillation anticoagulated on Eliquis.     Clinical Impression  Stephanie Simmons admitted with the above listed diagnosis. Patients family at bedside and able to provide history; Resident of [employer] - per family had not yet been evaluated, therefore unsure of true PLOF following last admission. Patient today requiring Min A for bed mobility and sit to stand at bedside. Max cueing for safety and sequencing during tasks with limited carryover. PT to recommend SNF at discharge to progress safe functional mobility as patient presents with high fall risk. PT to benefit from skilled PT intervention to address strength, balance, safety, functional mobility.     Follow Up Recommendations SNF;Supervision/Assistance - 24 hour    Equipment Recommendations  Other (comment)(TBD)    Recommendations for Other Services OT consult     Precautions / Restrictions Precautions Precautions: Fall Restrictions Weight Bearing Restrictions: No      Mobility  Bed Mobility Overal bed mobility: Needs Assistance Bed Mobility: Supine to Sit;Sit to Supine     Supine to sit: Min assist Sit to supine: Min assist   General bed mobility comments: Min A for LE management; increased time and effort; with all mobility patient extending L UE to pull up from  Transfers Overall transfer level: Needs assistance Equipment used: Rolling walker (2 wheeled) Transfers: Sit to/from Stand Sit to Stand: Min assist         General transfer  comment: sit to stand with heavy motivation to perform; only stande for <15 seconds and quickly sits down without good safety awareness  Ambulation/Gait                Stairs            Wheelchair Mobility    Modified Rankin (Stroke Patients Only) Modified Rankin (Stroke Patients Only) Pre-Morbid Rankin Score: No symptoms Modified Rankin: Moderately severe disability     Balance Overall balance assessment: Needs assistance Sitting-balance support: Feet supported;Bilateral upper extremity supported Sitting balance-Leahy Scale: Fair                                       Pertinent Vitals/Pain Pain Assessment: Faces Faces Pain Scale: No hurt    Home Living Family/patient expects to be discharged to:: Skilled nursing facility   Available Help at Discharge: [place]                  Prior Function Level of Independence: Needs assistance         Comments: family reporting that after last discharge patient went to [facility] - had not yet been evaluated by therapy and was often placed in w/c; PT note from last admission, patient ambulating short distances with Min A with RW     Hand Dominance   Dominant Hand: Right    Extremity/Trunk Assessment   Upper Extremity Assessment Upper Extremity Assessment: Defer to OT evaluation    Lower Extremity Assessment Lower Extremity Assessment: Generalized weakness    Cervical / Trunk Assessment Cervical /  Trunk Assessment: Kyphotic  Communication   Communication: HOH  Cognition Arousal/Alertness: Awake/alert Behavior During Therapy: Flat affect;Restless Overall Cognitive Status: Impaired/Different from baseline Area of Impairment: Orientation;Attention;Following commands;Safety/judgement;Problem solving                 Orientation Level: Disoriented to;Place;Time;Situation Current Attention Level: Sustained   Following Commands: Follows one step commands  inconsistently;Follows one step commands with increased time Safety/Judgement: Decreased awareness of safety;Decreased awareness of deficits   Problem Solving: Slow processing;Decreased initiation;Difficulty sequencing;Requires verbal cues;Requires tactile cues General Comments: will continuously state "let me hold on" "hold onto me"       General Comments      Exercises     Assessment/Plan    PT Assessment Patient needs continued PT services  PT Problem List Decreased strength;Decreased range of motion;Decreased activity tolerance;Decreased balance;Decreased mobility;Decreased coordination;Decreased knowledge of use of DME;Decreased safety awareness;Decreased knowledge of precautions;Decreased cognition       PT Treatment Interventions DME instruction;Functional mobility training;Gait training;Stair training;Therapeutic activities;Therapeutic exercise;Balance training;Neuromuscular re-education;Patient/family education    PT Goals (Current goals can be found in the Care Plan section)  Acute Rehab PT Goals Patient Stated Goal: none stated PT Goal Formulation: With patient Time For Goal Achievement: 01/21/18 Potential to Achieve Goals: Fair    Frequency Min 3X/week   Barriers to discharge        Co-evaluation               AM-PAC PT "6 Clicks" Daily Activity  Outcome Measure Difficulty turning over in bed (including adjusting bedclothes, sheets and blankets)?: Unable Difficulty moving from lying on back to sitting on the side of the bed? : Unable Difficulty sitting down on and standing up from a chair with arms (e.g., wheelchair, bedside commode, etc,.)?: Unable Help needed moving to and from a bed to chair (including a wheelchair)?: A Little Help needed walking in hospital room?: A Lot Help needed climbing 3-5 steps with a railing? : Total 6 Click Score: 9    End of Session Equipment Utilized During Treatment: Gait belt Activity Tolerance: Patient tolerated  treatment well Patient left: in bed;with call bell/phone within reach;with bed alarm set;with family/visitor present Nurse Communication: Mobility status PT Visit Diagnosis: Other abnormalities of gait and mobility (R26.89);Muscle weakness (generalized) (M62.81)    Time: 9480-1655 PT Time Calculation (min) (ACUTE ONLY): 35 min   Charges:   PT Evaluation $PT Eval Moderate Complexity: 1 Mod PT Treatments $Therapeutic Activity: 8-22 mins   PT G Codes:        Lanney Gins, PT, DPT 01/07/18 10:21 AM Pager: [phone]

## 2018-01-07 NOTE — Evaluation (Signed)
Clinical/Bedside Swallow Evaluation Patient Details  Name: Stephanie Simmons MRN: [id number] Date of Birth: 1940/12/23  Today's Date: 01/07/2018 Time: SLP Start Time (ACUTE ONLY): 1200 SLP Stop Time (ACUTE ONLY): 1220 SLP Time Calculation (min) (ACUTE ONLY): 20 min  Past Medical History:  Past Medical History:  Diagnosis Date  . Chest pain    a. 04/2009 Cath: essentially nl cors, EF 65%, mod LVH.  Marland Kitchen CHF (congestive heart failure) ([place])   . CVA (cerebral vascular accident) ([place])   . GERD (gastroesophageal reflux disease)   . Hyperlipidemia   . Hypertension   . Hypothyroidism    does not take medication for this any longer per patient   . LVH (left ventricular hypertrophy)    a. 04/2009 Echo: EF 55-65%, mild conc LVH, no reg wma, Gr 1 DD, mild MR.  . Monocytosis 04/21/2014   Past Surgical History:  Past Surgical History:  Procedure Laterality Date  . ABDOMINAL HYSTERECTOMY    . LOOP RECORDER INSERTION N/A 10/16/2017   Procedure: LOOP RECORDER INSERTION;  Surgeon: Stephanie Lance, MD;  Location: [facility];  Service: Cardiovascular;  Laterality: N/A;  . MASS EXCISION     removal of mass- pleural    HPI:  Stephanie Parkeris a 77 y.o.femalewith medical history significant ofHTN, HLD,CHF, Afib on eliquis; who presents with complains of weakness over the last 2 weeks. Patient at baseline able to ambulate without need of assistance. However has complained of progressive worsening non-focal weakness to the point where she needs assistance to ambulate. Difficulty to illicit a history from patient. She complains of headache, chest pain, abdominal pain, shortness of breath, orthopnea and lower extremity swelling. It isn't clear if patient has been taking medications like prescribed. MRI revealed acute nonhemorrhagic RIGHT cerebellar/PICA territory infarct with old small bilateral cerebellar and LEFT basal ganglia infarcts and mild chronic small vessel ischemic changes.    Assessment / Plan / Recommendation Clinical Impression  Patient presented to hospital with signs of stroke (facial droop and aphasia) and MRI showed New 5 cm region of acute infarction in the left deep insula and parietal lobe consistent with MCA branch vessel occlusion. New 1 cm acute infarction in the left cerebellum. During entire evaluation patient perseverated on several phrases "take the clothes" and "pick up the clothes". Pt was presented with ice chips, water by cup and straw, applesauce and cracker. Pt swallow was timely and with no clearing of throat or coughing with all consistencies except the cracker. Pt held the cracker in right side of mouth, no mastication and returned to perseverating on clothes again. Cracker was removed from mouth. Patient presents with oral dysphagia related to cognitive deficits. Recommend Dys 1, thin liquid diet with medication crushed in applesauce. Speech therapy will follow up with diet tolerance, diet upgrade and swallowing tx. Recommendations were shared with the nurse.  SLP Visit Diagnosis: Dysphagia, oral phase (R13.11)    Aspiration Risk  Moderate aspiration risk    Diet Recommendation Dysphagia 1 (Puree);Thin liquid   Liquid Administration via: Cup Medication Administration: Crushed with puree Supervision: Full supervision/cueing for compensatory strategies Compensations: Minimize environmental distractions;Slow rate;Small sips/bites;Lingual sweep for clearance of pocketing Postural Changes: Seated upright at 90 degrees    Other  Recommendations Oral Care Recommendations: Oral care BID   Follow up Recommendations Skilled Nursing facility      Frequency and Duration min 2x/week  2 weeks       Prognosis Prognosis for Safe Diet Advancement: Fair Barriers to Reach Goals: Cognitive  deficits      Swallow Study   General Date of Onset: 01/06/18 HPI: Stephanie Simmons a 77 y.o.femalewith medical history significant ofHTN, HLD,CHF,  Afib on eliquis; who presents with complains of weakness over the last 2 weeks. Patient at baseline able to ambulate without need of assistance. However has complained of progressive worsening non-focal weakness to the point where she needs assistance to ambulate. Difficulty to illicit a history from patient. She complains of headache, chest pain, abdominal pain, shortness of breath, orthopnea and lower extremity swelling. It isn't clear if patient has been taking medications like prescribed. MRI revealed acute nonhemorrhagic RIGHT cerebellar/PICA territory infarct with old small bilateral cerebellar and LEFT basal ganglia infarcts and mild chronic small vessel ischemic changes. Type of Study: Bedside Swallow Evaluation Previous Swallow Assessment: no Diet Prior to this Study: NPO Temperature Spikes Noted: No Respiratory Status: Room air History of Recent Intubation: No Behavior/Cognition: Confused Oral Cavity Assessment: Within Functional Limits Oral Care Completed by SLP: Yes Oral Cavity - Dentition: Dentures, top;Dentures, bottom Vision: Functional for self-feeding Self-Feeding Abilities: Needs assist Patient Positioning: Upright in bed Baseline Vocal Quality: Normal Volitional Cough: Strong Volitional Swallow: Unable to elicit    Oral/Motor/Sensory Function Overall Oral Motor/Sensory Function: Within functional limits   Ice Chips Ice chips: Within functional limits Presentation: Spoon   Thin Liquid Thin Liquid: Within functional limits Presentation: Cup;Straw    Nectar Thick Nectar Thick Liquid: Not tested   Honey Thick Honey Thick Liquid: Not tested   Puree Puree: Within functional limits Presentation: Spoon   Solid   GO   Solid: Impaired Oral Phase Impairments: Poor awareness of bolus Oral Phase Functional Implications: Impaired mastication;Oral holding       [place], MA, CCC-SLP 01/07/2018 1:20 PM

## 2018-01-08 ENCOUNTER — Inpatient Hospital Stay (HOSPITAL_COMMUNITY): Payer: Medicare Other

## 2018-01-08 ENCOUNTER — Telehealth (HOSPITAL_COMMUNITY): Payer: Self-pay | Admitting: Cardiology

## 2018-01-08 DIAGNOSIS — I5033 Acute on chronic diastolic (congestive) heart failure: Secondary | ICD-10-CM

## 2018-01-08 LAB — CBC
HEMATOCRIT: 51.7 % — AB (ref 36.0–46.0)
Hemoglobin: 16.1 g/dL — ABNORMAL HIGH (ref 12.0–15.0)
MCH: 25.1 pg — AB (ref 26.0–34.0)
MCHC: 31.1 g/dL (ref 30.0–36.0)
MCV: 80.5 fL (ref 78.0–100.0)
Platelets: 388 10*3/uL (ref 150–400)
RBC: 6.42 MIL/uL — AB (ref 3.87–5.11)
RDW: 19.2 % — AB (ref 11.5–15.5)
WBC: 11.9 10*3/uL — ABNORMAL HIGH (ref 4.0–10.5)

## 2018-01-08 LAB — BASIC METABOLIC PANEL
Anion gap: 7 (ref 5–15)
BUN: 19 mg/dL (ref 8–23)
CHLORIDE: 110 mmol/L (ref 98–111)
CO2: 19 mmol/L — AB (ref 22–32)
CREATININE: 0.91 mg/dL (ref 0.44–1.00)
Calcium: 9.1 mg/dL (ref 8.9–10.3)
GFR calc Af Amer: >60 mL/min (ref >60)
GFR calc non Af Amer: 59 mL/min — ABNORMAL LOW (ref >60)
Glucose, Bld: 97 mg/dL (ref 70–99)
Potassium: 6.4 mmol/L (ref 3.5–5.1)
SODIUM: 136 mmol/L (ref 135–145)

## 2018-01-08 LAB — MRSA PCR SCREENING: MRSA by PCR: NEGATIVE

## 2018-01-08 MED ORDER — FUROSEMIDE 10 MG/ML IJ SOLN
40.0000 mg | Freq: Two times a day (BID) | INTRAMUSCULAR | Status: AC
  Administered 2018-01-08 (×2): 40 mg via INTRAVENOUS
  Filled 2018-01-08 (×2): qty 4

## 2018-01-08 MED ORDER — DILTIAZEM HCL ER COATED BEADS 120 MG PO CP24
120.0000 mg | ORAL_CAPSULE | Freq: Every day | ORAL | Status: DC
  Administered 2018-01-08 – 2018-01-10 (×3): 120 mg via ORAL
  Filled 2018-01-08 (×3): qty 1

## 2018-01-08 MED ORDER — SODIUM POLYSTYRENE SULFONATE PO POWD
30.0000 g | Freq: Once | ORAL | Status: AC
  Administered 2018-01-08: 30 g via ORAL
  Filled 2018-01-08 (×2): qty 30

## 2018-01-08 MED ORDER — IPRATROPIUM-ALBUTEROL 0.5-2.5 (3) MG/3ML IN SOLN: 3.0000 mL | Freq: Four times a day (QID) | RESPIRATORY_TRACT | Status: DC | PRN

## 2018-01-08 NOTE — Progress Notes (Signed)
  Speech Language Pathology Treatment: Dysphagia  Patient Details Name: Stephanie Simmons MRN: [id number] DOB: [birth year] Today's Date: 01/08/2018 Time: 4585-9292 SLP Time Calculation (min) (ACUTE ONLY): 14 min  Assessment / Plan / Recommendation Clinical Impression  Pt appears to be tolerating a dysphagia 1 diet with thin liquids, but requires full supervision/assist given right gaze preference, inability to follow commands, left inattention with oral pocketing and spillage from mouth.  No overt s/s of aspiration.  Pt eager to eat/drink initially, then pushed food away, communicating her desire to stop.  Continue current diet.   HPI HPI: Patient is a 77 y/o female presenting with aphasia, L gaze preference, facial droop and mild R arm drift. Currently undergoing neuro work-up. Of note, patient discharged from [facility] on 01/04/18 after a nonhemorrhagic right sella Beller/PICA territory infarction. Patient with a PMH significant for hypertension, hyperlipidemia, CHF, atrial fibrillation anticoagulated on Eliquis.       SLP Plan  Continue with current plan of care  Patient needs continued Speech Lanaguage Pathology Services    Recommendations  Diet recommendations: Dysphagia 1 (puree);Thin liquid Liquids provided via: Straw;Cup Medication Administration: Crushed with puree Supervision: Staff to assist with self feeding Compensations: Minimize environmental distractions;Slow rate;Small sips/bites;Lingual sweep for clearance of pocketing                Follow up Recommendations: Skilled Nursing facility SLP Visit Diagnosis: Dysphagia, unspecified (R13.10) Plan: Continue with current plan of care       GO                Juan Quam Laurice 01/08/2018, 10:47 AM

## 2018-01-08 NOTE — NC FL2 (Addendum)
[place] LEVEL OF CARE SCREENING TOOL     IDENTIFICATION  Patient Name: Stephanie Simmons Birthdate: 12/23/1940 Sex: female Admission Date (Current Location): 01/06/2018  [facility] and [place] Number:  Herbalist and Address:  The . [facility], [place] [address] [place], [place] [address]      Provider Number: [id number]  Attending Physician Name and Address:  Geradine Girt, DO  Relative Name and Phone Number:   Devonia Farro [phone]    Current Level of Care: Hospital Recommended Level of Care: [place] Prior Approval Number:    Date Approved/Denied:   PASRR Number: [id number]  Discharge Plan: SNF    Current Diagnoses: Patient Active Problem List   Diagnosis Date Noted  . Cerebral embolism with cerebral infarction 01/06/2018  . Stroke ([place]) 01/06/2018  . Seizure ([place]) 01/06/2018  . Hyperlipidemia 01/02/2018  . Elevated troponin 01/02/2018  . CVA (cerebral vascular accident) ([place]) 01/01/2018  . Chest pain, atypical 09/23/2014  . Acute on chronic diastolic CHF (congestive heart failure), NYHA class 3 ([place]) 09/23/2014  . Acute diastolic heart failure ([place]) 09/23/2014  . Monocytosis 04/21/2014  . Neurofibromatosis ([place]) 04/21/2014  . Poor oral hygiene 04/21/2014  . Atrial fibrillation ([place]) 04/15/2014  . PSVT (paroxysmal supraventricular tachycardia) ([place]) 09/25/2013  . Syncope 09/10/2013  . Atrial flutter ([place]) 09/10/2013  . HTN (hypertension) 09/10/2013    Orientation RESPIRATION BLADDER Height & Weight     Self, Time, Situation, Place  Normal Continent Weight: 117 lb 8.1 oz (53.3 kg) Height:  5\' 2"  (157.5 cm)  BEHAVIORAL SYMPTOMS/MOOD NEUROLOGICAL BOWEL NUTRITION STATUS      Continent Diet(heart healthy)  AMBULATORY STATUS COMMUNICATION OF NEEDS Skin   Extensive Assist Verbally Normal                       Personal Care Assistance Level of Assistance  Bathing, Feeding, Dressing Bathing  Assistance: Maximum assistance Feeding assistance: Independent Dressing Assistance: Maximum assistance     Functional Limitations Info  Sight, Hearing, Speech Sight Info: Impaired Hearing Info: Adequate Speech Info: Impaired    SPECIAL CARE FACTORS FREQUENCY  PT (By licensed PT), OT (By licensed OT), Speech (By licensed SLP)     PT Frequency: 5x week OT Frequency: 5x week  SLP Frequency: 3x week            Contractures Contractures Info: Not present    Additional Factors Info  Code Status, Allergies, Psychotropic Code Status Info: DNR Allergies Info: No Known Allergies Psychotropic Info: FLUoxetine (PROZAC) capsule 20 mg daily PO         Current Medications (01/08/2018):  This is the current hospital active medication list Current Facility-Administered Medications  Medication Dose Route Frequency Provider Last Rate Last Dose  . acetaminophen (TYLENOL) tablet 650 mg  650 mg Oral Q4H PRN Lady Deutscher, MD       Or  . acetaminophen (TYLENOL) solution 650 mg  650 mg Per Tube Q4H PRN Lady Deutscher, MD       Or  . acetaminophen (TYLENOL) suppository 650 mg  650 mg Rectal Q4H PRN Lady Deutscher, MD      . apixaban Arne [place]) tablet 5 mg  5 mg Oral BID Lady Deutscher, MD   5 mg at 01/08/18 1111  . atorvastatin (LIPITOR) tablet 80 mg  80 mg Oral QPM Vann, Jessica U, DO   80 mg at 01/07/18 1723  . chlorhexidine (PERIDEX) 0.12 %  solution 15 mL  15 mL Mouth/Throat BID Lady Deutscher, MD   15 mL at 01/08/18 1111  . cholecalciferol (VITAMIN D) tablet 2,000 Units  2,000 Units Oral Daily Lady Deutscher, MD   2,000 Units at 01/08/18 1111  . dorzolamide-timolol (COSOPT) 22.3-6.8 MG/ML ophthalmic solution 1 drop  1 drop Both Eyes BID Lady Deutscher, MD   1 drop at 01/08/18 1112  . FLUoxetine (PROZAC) capsule 20 mg  20 mg Oral Daily Lady Deutscher, MD   20 mg at 01/08/18 1110  . furosemide (LASIX) injection 40 mg  40 mg Intravenous BID Vann, Jessica U, DO    40 mg at 01/08/18 1314  . hydroxypropyl methylcellulose / hypromellose (ISOPTO TEARS / GONIOVISC) 2.5 % ophthalmic solution 1 drop  1 drop Both Eyes PRN Lady Deutscher, MD      . latanoprost (XALATAN) 0.005 % ophthalmic solution 1 drop  1 drop Both Eyes QHS Lady Deutscher, MD   1 drop at 01/07/18 2225  . metoprolol succinate (TOPROL-XL) 24 hr tablet 25 mg  25 mg Oral Daily Lady Deutscher, MD   25 mg at 01/08/18 1111  . pantoprazole (PROTONIX) EC tablet 40 mg  40 mg Oral Daily Lady Deutscher, MD   40 mg at 01/08/18 1111  . senna-docusate (Senokot-S) tablet 1 tablet  1 tablet Oral QHS PRN Lady Deutscher, MD      . sodium chloride (MURO 128) 5 % ophthalmic solution 2 drop  2 drop Both Eyes Q4H PRN Lady Deutscher, MD         Discharge Medications: Please see discharge summary for a list of discharge medications.  Relevant Imaging Results:  Relevant Lab Results:   Additional Information  SS[id number] [place], [place]

## 2018-01-08 NOTE — Evaluation (Signed)
Speech Language Pathology Evaluation Patient Details Name: Stephanie Simmons MRN: [id number] DOB: [birth year] Today's Date: 01/08/2018 Time: 1004-1020 SLP Time Calculation (min) (ACUTE ONLY): 16 min  Problem List:  Patient Active Problem List   Diagnosis Date Noted  . Cerebral embolism with cerebral infarction 01/06/2018  . Stroke ([place]) 01/06/2018  . Seizure ([place]) 01/06/2018  . Hyperlipidemia 01/02/2018  . Elevated troponin 01/02/2018  . CVA (cerebral vascular accident) ([place]) 01/01/2018  . Chest pain, atypical 09/23/2014  . Acute on chronic diastolic CHF (congestive heart failure), NYHA class 3 ([place]) 09/23/2014  . Acute diastolic heart failure ([place]) 09/23/2014  . Monocytosis 04/21/2014  . Neurofibromatosis ([place]) 04/21/2014  . Poor oral hygiene 04/21/2014  . Atrial fibrillation ([place]) 04/15/2014  . PSVT (paroxysmal supraventricular tachycardia) ([place]) 09/25/2013  . Syncope 09/10/2013  . Atrial flutter ([place]) 09/10/2013  . HTN (hypertension) 09/10/2013   Past Medical History:  Past Medical History:  Diagnosis Date  . Chest pain    a. 04/2009 Cath: essentially nl cors, EF 65%, mod LVH.  Marland Kitchen CHF (congestive heart failure) ([place])   . CVA (cerebral vascular accident) ([place])   . GERD (gastroesophageal reflux disease)   . Hyperlipidemia   . Hypertension   . Hypothyroidism    does not take medication for this any longer per patient   . LVH (left ventricular hypertrophy)    a. 04/2009 Echo: EF 55-65%, mild conc LVH, no reg wma, Gr 1 DD, mild MR.  . Monocytosis 04/21/2014   Past Surgical History:  Past Surgical History:  Procedure Laterality Date  . ABDOMINAL HYSTERECTOMY    . LOOP RECORDER INSERTION N/A 10/16/2017   Procedure: LOOP RECORDER INSERTION;  Surgeon: Evans Lance, MD;  Location: [facility];  Service: Cardiovascular;  Laterality: N/A;  . MASS EXCISION     removal of mass- pleural    HPI:  Patient is a 77 y/o female presenting with aphasia, L gaze preference,  facial droop and mild R arm drift. Currently undergoing neuro work-up. Of note, patient discharged from [facility] on 01/04/18 after a nonhemorrhagic right sella Beller/PICA territory infarction. Patient with a PMH significant for hypertension, hyperlipidemia, CHF, atrial fibrillation anticoagulated on Eliquis.    Assessment / Plan / Recommendation Clinical Impression  Pt presents with a severe posterior aphasia, with receptive>receptive impairments.  Pt is producing short phrases (3-6 words in length) with no motor deficits, but significant anomia; inability to repeat; unable to produce automatic sequences.  Expression is c/b perseverative phrases with some variability in production and emerging ability to incorporate new targets into phrases.  Pt did not follow any commands during assessment.  She demonstrated some volitional control when shifting verbal to motor output and back to verbal output again.  She has significant communication needs and will benefit from intensive aphasia therapy.     SLP Assessment  SLP Recommendation/Assessment: Patient needs continued Speech Lanaguage Pathology Services SLP Visit Diagnosis: Aphasia (R47.01)    Follow Up Recommendations  Skilled Nursing facility    Frequency and Duration min 3x week  1 week      SLP Evaluation Cognition  Overall Cognitive Status: Impaired/Different from baseline Arousal/Alertness: Awake/alert Attention: Focused Focused Attention: Impaired       Comprehension  Auditory Comprehension Overall Auditory Comprehension: Impaired Yes/No Questions: Impaired Basic Biographical Questions: 0-25% accurate Commands: Impaired One Step Basic Commands: 0-24% accurate Conversation: Simple(very poor comprehension) Visual Recognition/Discrimination Discrimination: Not tested Reading Comprehension Reading Status: Not tested    Expression Expression Primary Mode of Expression: Verbal  Verbal Expression Overall Verbal Expression:  Impaired Initiation: No impairment Automatic Speech: (unable) Level of Generative/Spontaneous Verbalization: Phrase Repetition: Impaired Level of Impairment: Word level Naming: Impairment Responsive: 0-25% accurate Confrontation: Impaired Convergent: 0-24% accurate Verbal Errors: Semantic paraphasias   Oral / Motor  Oral Motor/Sensory Function Overall Oral Motor/Sensory Function: Moderate impairment Facial ROM: Reduced left Facial Symmetry: Abnormal symmetry left;Suspected CN VII (facial) dysfunction Facial Strength: Reduced left;Suspected CN VII (facial) dysfunction Lingual Symmetry: Within Functional Limits Motor Speech Overall Motor Speech: Appears within functional limits for tasks assessed   GO                    Juan Quam Laurice 01/08/2018, 10:44 AM

## 2018-01-08 NOTE — Progress Notes (Signed)
Progress Note    Stephanie Simmons  TDD:[id number] DOB: 08/05/1940  DOA: 01/06/2018 PCP: Leighton Ruff, MD    Brief Narrative:   Medical records reviewed and are as summarized below:  Stephanie Simmons is an 77 y.o. female with medical history significant of hypertension, hyperlipidemia, CHF, atrial fibrillation anticoagulated on Eliquis discharged on Friday after a nonhemorrhagic right cellebeller/PICA territory infarction.     Assessment/Plan:   Principal Problem:   Cerebral embolism with cerebral infarction Active Problems:   HTN (hypertension)   Atrial fibrillation (HCC)   Neurofibromatosis (HCC)   Acute on chronic diastolic CHF (congestive heart failure), NYHA class 3 (HCC)   Hyperlipidemia   Stroke (HCC)   Seizure (HCC)  Cerebral embolism with cerebral infarction:  -MRI of the brain shows new CVAs -EEG w/o seizure focus -CT of the chest abdomen and pelvis w/o possible cuase -discussed plan with sister-- continue eliquis for now  Possible seizure:  -EEG w/o current seizure like activity  Hyperkalemia -from [place] [facility] -kay-exelate and d/c Kdur for now   Hypertension:  -permissive HTN  Atrial fibrillation continue Eliquis and metoprolol.  Restart diltiazem as soon as possible.  Acute on  chronic diastolic congestive heart failure New York Heart Association class III:  -IV lasix today, resume PO   Hyperlipidemia -continue atorvastatin.  Severe pulmonary HTN -being sent to Dr. Aundra Dubin as an outpatient   Family Communication/Anticipated D/C date and plan/Code Status   DVT prophylaxis: equis Code Status: DNR  Family Communication: called sister Disposition Plan: back to SNF when bed available   Medical Consultants:    neuro     Subjective:   Continues to just reply yes to all questions  Objective:    Vitals:   01/07/18 2000 01/07/18 2349 01/08/18 0400 01/08/18 0816  BP: (!) 135/116 118/82 133/89 (!) 128/93  Pulse: 96  92 (!) 104 100  Resp: 18 18 18 16   Temp: 97.6 F (36.4 C) (!) 97.4 F (36.3 C) 97.8 F (36.6 C) 98.2 F (36.8 C)  TempSrc: Oral Oral Oral Oral  SpO2: 98% 99% 97% (!) 83%  Weight:      Height:       No intake or output data in the 24 hours ending 01/08/18 1403 Filed Weights   01/06/18 1240  Weight: 53.3 kg (117 lb 8.1 oz)    Exam: In bed, chronically ill appearing Poor eye contact Irregular Diminished breath sounds +LE edema  Data Reviewed:   I have personally reviewed following labs and imaging studies:  Labs: Labs show the following:   Basic Metabolic Panel: Recent Labs  Lab 01/02/18 0551 01/03/18 0526 01/04/18 0426 01/06/18 1339 01/06/18 1353 01/08/18 0642  NA 137 136 135 132* 134* 136  K 3.6 5.0 4.9 4.6 4.9 6.4*  CL 103 105 105 100 100 110  CO2 23 20* 24 24  --  19*  GLUCOSE 101* 105* 102* 112* 111* 97  BUN 12 13 9 8 9 19   CREATININE 0.76 0.80 0.65 0.73 0.60 0.91  CALCIUM 9.0 9.0 9.1 9.3  --  9.1  MG 1.7  --   --   --   --   --    GFR Estimated Creatinine Clearance: 40.9 mL/min (by C-G formula based on SCr of 0.91 mg/dL). Liver Function Tests: Recent Labs  Lab 01/01/18 1440 01/03/18 0526 01/06/18 1339  AST 29 31 24   ALT 27 30 32  ALKPHOS 43 41 64  BILITOT 1.4* 1.0 0.8  PROT 5.6* 5.3* 6.2*  ALBUMIN 3.3* 2.9* 3.1*   No results for input(s): LIPASE, AMYLASE in the last 168 hours. No results for input(s): AMMONIA in the last 168 hours. Coagulation profile Recent Labs  Lab 01/01/18 1440 01/06/18 1339  INR 1.55 1.81    CBC: Recent Labs  Lab 01/01/18 1440 01/02/18 0551 01/06/18 1339 01/06/18 1353 01/08/18 0642  WBC 7.5 7.5 10.5  --  11.9*  NEUTROABS  --  4.7 7.4  --   --   HGB 13.8 13.4 15.6* 18.4* 16.1*  HCT 45.2 44.3 50.8* 54.0* 51.7*  MCV 81.6 81.7 82.3  --  80.5  PLT 245 252 345  --  388   Cardiac Enzymes: Recent Labs  Lab 01/01/18 1440 01/01/18 1824 01/02/18 0551  TROPONINI 0.13* 0.11* 0.10*   BNP (last 3 results) No  results for input(s): PROBNP in the last 8760 hours. CBG: Recent Labs  Lab 01/03/18 1125 01/03/18 1555 01/03/18 2145 01/04/18 1108 01/06/18 1342  GLUCAP 118* 111* 117* 135* 106*   D-Dimer: No results for input(s): DDIMER in the last 72 hours. Hgb A1c: Recent Labs    01/07/18 0742  HGBA1C 6.2*   Lipid Profile: Recent Labs    01/07/18 0742  CHOL 135  HDL 35*  LDLCALC 88  TRIG 59  CHOLHDL 3.9   Thyroid function studies: No results for input(s): TSH, T4TOTAL, T3FREE, THYROIDAB in the last 72 hours.  Invalid input(s): FREET3 Anemia work up: No results for input(s): VITAMINB12, FOLATE, FERRITIN, TIBC, IRON, RETICCTPCT in the last 72 hours. Sepsis Labs: Recent Labs  Lab 01/01/18 1440 01/02/18 0551 01/06/18 1339 01/08/18 0642  WBC 7.5 7.5 10.5 11.9*    Microbiology No results found for this or any previous visit (from the past 240 hour(s)).  Procedures and diagnostic studies:  Ct Chest W Contrast  Result Date: 01/07/2018 CLINICAL DATA:  77 y.o. African American female with PMH of HTN, HLD, LVHP, AF on eliquis, hypothyroidism, recent right PICA stroke and loop placed for syncope presented as code stroke. CT use ordered to evaluate for underlying malignancy given recurrent strokes on anticoagulation. EXAM: CT CHEST, ABDOMEN, AND PELVIS WITH CONTRAST TECHNIQUE: Multidetector CT imaging of the chest, abdomen and pelvis was performed following the standard protocol during bolus administration of intravenous contrast. CONTRAST:  132mL OMNIPAQUE IOHEXOL 300 MG/ML  SOLN COMPARISON:  Chest radiographs 01/01/2018. FINDINGS: CT CHEST FINDINGS Cardiovascular: There is atherosclerosis of the aorta, great vessels and coronary arteries. No evidence of acute pulmonary embolism. The central pulmonary arteries are enlarged, consistent with pulmonary arterial hypertension. The heart is enlarged, especially both atria. There is no pericardial effusion. Mediastinum/Nodes: There are no enlarged  mediastinal, hilar or axillary lymph nodes. The thyroid gland is mildly enlarged and heterogeneous, most consistent with a goiter. There is some fluid in the esophagus which is not significantly distended. Lungs/Pleura: No significant pleural effusion. There is pleural thickening and calcification posteriorly in the left hemithorax. There is moderate centrilobular and paraseptal emphysema. Patient is status post left thoracotomy, and surgical clips are present at the left apex. There is scattered subpleural scarring throughout the left lung. No suspicious pulmonary nodule. Musculoskeletal/Chest wall: Left thoracotomy defect with associated rib deformities. No chest wall mass or suspicious osseous findings. Mild edema throughout the soft tissues. CT ABDOMEN AND PELVIS FINDINGS Hepatobiliary: No focal hepatic abnormality. There is prominent reflux of contrast from the right atrium into the IVC and hepatic veins. There is increased density within the gallbladder lumen, likely vicarious excretion of contrast. No evidence of  gallstones, gallbladder wall thickening or biliary dilatation. Pancreas: Unremarkable. No pancreatic ductal dilatation or surrounding inflammatory changes. Spleen: Normal in size without focal abnormality. Adrenals/Urinary Tract: Both adrenal glands appear normal. There is cortical scarring in the lower pole of the right kidney. No evidence of urinary tract calculus, renal mass or hydronephrosis. There is contrast material in the bladder which otherwise appears unremarkable. Stomach/Bowel: The stomach and small bowel appear normal. There is mild wall thickening throughout the proximal colon. No evidence of bowel obstruction. The appendix appears normal. Vascular/Lymphatic: There are no enlarged abdominal or pelvic lymph nodes. There is extensive aortic and branch vessel atherosclerosis with a long segment occlusion of the right external iliac artery. There is distal reconstitution of the right  femoral artery. Reproductive: Hysterectomy.  No adnexal mass. Other: There is a small amount of pelvic and right pericolic gutter ascites. There is generalized edema throughout the subcutaneous and intra-abdominal fat. Musculoskeletal: No acute or significant osseous findings. Mild degenerative changes in the spine. IMPRESSION: 1. No evidence of malignancy within the chest, abdomen or pelvis. 2. Anasarca with generalized soft tissue edema and ascites. This may account for proximal colonic wall thickening. Alternatively, this could reflect colitis. 3. Cardiomegaly with prominent reflux of contrast into the IVC and hepatic veins consistent with right heart failure. Central enlargement of the pulmonary arteries consistent with pulmonary arterial hypertension. 4. Aortic Atherosclerosis (ICD10-I70.0). Long segment occlusion of the right common iliac artery with distal reconstitution. 5. Possible posttraumatic and postsurgical changes in the left hemithorax with associated pleural calcification and subpleural scarring. Emphysema (ICD10-J43.9). Electronically Signed   By: Richardean Sale M.D.   On: 01/07/2018 16:05   Mr Brain Wo Contrast  Result Date: 01/06/2018 CLINICAL DATA:  Followup stroke. Recent development of aphasia and right facial droop. Previous right PICA stroke. EXAM: MRI HEAD WITHOUT CONTRAST TECHNIQUE: Multiplanar, multiecho pulse sequences of the brain and surrounding structures were obtained without intravenous contrast. COMPARISON:  Multiple CT examinations same day. CT and MRI examinations 01/01/2018 FINDINGS: Brain: Diffusion imaging again shows of all of inguinal far shin in the right posterior inferior cerebellar artery territory. No extension in that region. New subcentimeter acute infarction in the left cerebellum without swelling or hemorrhage. New 5 cm region of acute infarction in the left MCA territory affecting the deep insula and left parietal cortical and subcortical brain consistent with  left MCA branch vessel occlusion. No swelling or hemorrhage. Small of all vein left posterior frontal cortical infarction since the previous exam. Elsewhere, there is chronic small-vessel ischemic change affecting the pons and cerebellar hemispheres. Cerebral hemispheres elsewhere show chronic small-vessel ischemic changes of the deep and subcortical white matter and some old small vessel infarctions of the thalami and basal ganglia. No hydrocephalus. No extra-axial collection. Vascular: Major vessels at the base of the brain show flow. Skull and upper cervical spine: Negative Sinuses/Orbits: Clear/normal Other: None IMPRESSION: New 5 cm region of acute infarction in the left deep insula and parietal lobe consistent with MCA branch vessel occlusion. New 1 cm acute infarction in the left cerebellum. Expected evolutionary changes within the right posterior inferior cerebellar artery territory stroke shown previously. No significant swelling or hemorrhage. Redemonstration of a minimal left posterior frontal cortical infarction without swelling or hemorrhage. Multiple vascular territory infarctions suggest embolic disease from the heart or ascending aorta. Chronic small-vessel ischemic changes throughout the brain appear unchanged. Electronically Signed   By: Nelson Chimes M.D.   On: 01/06/2018 17:30   Ct Abdomen Pelvis W Contrast  Result Date: 01/07/2018 CLINICAL DATA:  77 y.o. African American female with PMH of HTN, HLD, LVHP, AF on eliquis, hypothyroidism, recent right PICA stroke and loop placed for syncope presented as code stroke. CT use ordered to evaluate for underlying malignancy given recurrent strokes on anticoagulation. EXAM: CT CHEST, ABDOMEN, AND PELVIS WITH CONTRAST TECHNIQUE: Multidetector CT imaging of the chest, abdomen and pelvis was performed following the standard protocol during bolus administration of intravenous contrast. CONTRAST:  134mL OMNIPAQUE IOHEXOL 300 MG/ML  SOLN COMPARISON:  Chest  radiographs 01/01/2018. FINDINGS: CT CHEST FINDINGS Cardiovascular: There is atherosclerosis of the aorta, great vessels and coronary arteries. No evidence of acute pulmonary embolism. The central pulmonary arteries are enlarged, consistent with pulmonary arterial hypertension. The heart is enlarged, especially both atria. There is no pericardial effusion. Mediastinum/Nodes: There are no enlarged mediastinal, hilar or axillary lymph nodes. The thyroid gland is mildly enlarged and heterogeneous, most consistent with a goiter. There is some fluid in the esophagus which is not significantly distended. Lungs/Pleura: No significant pleural effusion. There is pleural thickening and calcification posteriorly in the left hemithorax. There is moderate centrilobular and paraseptal emphysema. Patient is status post left thoracotomy, and surgical clips are present at the left apex. There is scattered subpleural scarring throughout the left lung. No suspicious pulmonary nodule. Musculoskeletal/Chest wall: Left thoracotomy defect with associated rib deformities. No chest wall mass or suspicious osseous findings. Mild edema throughout the soft tissues. CT ABDOMEN AND PELVIS FINDINGS Hepatobiliary: No focal hepatic abnormality. There is prominent reflux of contrast from the right atrium into the IVC and hepatic veins. There is increased density within the gallbladder lumen, likely vicarious excretion of contrast. No evidence of gallstones, gallbladder wall thickening or biliary dilatation. Pancreas: Unremarkable. No pancreatic ductal dilatation or surrounding inflammatory changes. Spleen: Normal in size without focal abnormality. Adrenals/Urinary Tract: Both adrenal glands appear normal. There is cortical scarring in the lower pole of the right kidney. No evidence of urinary tract calculus, renal mass or hydronephrosis. There is contrast material in the bladder which otherwise appears unremarkable. Stomach/Bowel: The stomach and  small bowel appear normal. There is mild wall thickening throughout the proximal colon. No evidence of bowel obstruction. The appendix appears normal. Vascular/Lymphatic: There are no enlarged abdominal or pelvic lymph nodes. There is extensive aortic and branch vessel atherosclerosis with a long segment occlusion of the right external iliac artery. There is distal reconstitution of the right femoral artery. Reproductive: Hysterectomy.  No adnexal mass. Other: There is a small amount of pelvic and right pericolic gutter ascites. There is generalized edema throughout the subcutaneous and intra-abdominal fat. Musculoskeletal: No acute or significant osseous findings. Mild degenerative changes in the spine. IMPRESSION: 1. No evidence of malignancy within the chest, abdomen or pelvis. 2. Anasarca with generalized soft tissue edema and ascites. This may account for proximal colonic wall thickening. Alternatively, this could reflect colitis. 3. Cardiomegaly with prominent reflux of contrast into the IVC and hepatic veins consistent with right heart failure. Central enlargement of the pulmonary arteries consistent with pulmonary arterial hypertension. 4. Aortic Atherosclerosis (ICD10-I70.0). Long segment occlusion of the right common iliac artery with distal reconstitution. 5. Possible posttraumatic and postsurgical changes in the left hemithorax with associated pleural calcification and subpleural scarring. Emphysema (ICD10-J43.9). Electronically Signed   By: Richardean Sale M.D.   On: 01/07/2018 16:05   Dg Chest Port 1 View  Result Date: 01/08/2018 CLINICAL DATA:  Hypoxia, history of CHF EXAM: PORTABLE CHEST 1 VIEW COMPARISON:  CT chest of  01/07/2018 and chest x-ray of 01/01/2018 FINDINGS: Moderate cardiomegaly is again noted. No congestive heart failure is currently seen. No pleural effusion is noted. Left apical pleural thickening remains with left apical pleural sutures again noted. Pleural calcification again is  noted at the left lung base. No bony abnormality is seen. IMPRESSION: No change in moderate cardiomegaly.  No CHF. Electronically Signed   By: Ivar Drape M.D.   On: 01/08/2018 12:14    Medications:   . apixaban  5 mg Oral BID  . atorvastatin  80 mg Oral QPM  . chlorhexidine  15 mL Mouth/Throat BID  . cholecalciferol  2,000 Units Oral Daily  . dorzolamide-timolol  1 drop Both Eyes BID  . FLUoxetine  20 mg Oral Daily  . furosemide  40 mg Intravenous BID  . latanoprost  1 drop Both Eyes QHS  . metoprolol succinate  25 mg Oral Daily  . pantoprazole  40 mg Oral Daily   Continuous Infusions:   LOS: 1 day   Geradine Girt  [facility]   *Please refer to [email or web address], password [email or web address] to get updated schedule on who will round on this patient, as hospitalists switch teams weekly. If 7PM-7AM, please contact night-coverage at [email or web address], password [email or web address] for any overnight needs.  01/08/2018, 2:03 PM

## 2018-01-08 NOTE — Social Work (Signed)
CSW aware pt recently discharged to SNF on 7/16 to [facility]. Will follow for discharge planning. Pt still recommended for SNF.   [place], [place] [facility] [phone]

## 2018-01-08 NOTE — Progress Notes (Signed)
Spoke with Dawn at CHF clinic to notify Dr. Aundra Dubin that patient would need to reschedule appointment.

## 2018-01-08 NOTE — Telephone Encounter (Signed)
Olivia Mackie, Nurse from [facility] called to cancel patient's appt for 01/10/18  For New Pulm HTN with Dr. Aundra Dubin as pt was admitted d/t stroke.  I advised Olivia Mackie to have patient contact the Clinic to reschedule her appt once she is discharged home.

## 2018-01-09 DIAGNOSIS — I482 Chronic atrial fibrillation: Secondary | ICD-10-CM

## 2018-01-09 DIAGNOSIS — Q8500 Neurofibromatosis, unspecified: Secondary | ICD-10-CM

## 2018-01-09 LAB — CBC
HCT: 49 % — ABNORMAL HIGH (ref 36.0–46.0)
HEMOGLOBIN: 15 g/dL (ref 12.0–15.0)
MCH: 25.2 pg — ABNORMAL LOW (ref 26.0–34.0)
MCHC: 30.6 g/dL (ref 30.0–36.0)
MCV: 82.2 fL (ref 78.0–100.0)
Platelets: 348 10*3/uL (ref 150–400)
RBC: 5.96 MIL/uL — ABNORMAL HIGH (ref 3.87–5.11)
RDW: 18.6 % — AB (ref 11.5–15.5)
WBC: 11.3 10*3/uL — ABNORMAL HIGH (ref 4.0–10.5)

## 2018-01-09 LAB — BASIC METABOLIC PANEL
Anion gap: 9 (ref 5–15)
BUN: 27 mg/dL — ABNORMAL HIGH (ref 8–23)
CALCIUM: 9 mg/dL (ref 8.9–10.3)
CO2: 22 mmol/L (ref 22–32)
CREATININE: 1 mg/dL (ref 0.44–1.00)
Chloride: 108 mmol/L (ref 98–111)
GFR calc non Af Amer: 53 mL/min — ABNORMAL LOW (ref >60)
Glucose, Bld: 138 mg/dL — ABNORMAL HIGH (ref 70–99)
Potassium: 4 mmol/L (ref 3.5–5.1)
SODIUM: 139 mmol/L (ref 135–145)

## 2018-01-09 MED ORDER — POTASSIUM CHLORIDE CRYS ER 20 MEQ PO TBCR
40.0000 meq | EXTENDED_RELEASE_TABLET | Freq: Every day | ORAL | Status: DC
  Administered 2018-01-10: 40 meq via ORAL
  Filled 2018-01-09: qty 2

## 2018-01-09 MED ORDER — FUROSEMIDE 40 MG PO TABS
40.0000 mg | ORAL_TABLET | Freq: Two times a day (BID) | ORAL | Status: DC
  Administered 2018-01-10: 40 mg via ORAL
  Filled 2018-01-09: qty 1

## 2018-01-09 NOTE — Progress Notes (Signed)
Physical Therapy Treatment Patient Details Name: Stephanie Simmons MRN: [id number] DOB: 1940/12/31 Today's Date: 01/09/2018    History of Present Illness Patient is a 77 y/o female presenting with aphasia, L gaze preference, facial droop and mild R arm drift. Currently undergoing neuro work-up. Of note, patient discharged from [facility] on 01/04/18 after a nonhemorrhagic right sella Beller/PICA territory infarction. Patient with a PMH significant for hypertension, hyperlipidemia, CHF, atrial fibrillation anticoagulated on Eliquis.     PT Comments    Stephanie Simmons doing well today. Much more alert, but still seemingly confused and perseverative at times. Able to progress mobility today with 2 person HHA with Min A for transfers and short distance gait. Making good progress towards goals. PT recommendations remain appropriate.     Follow Up Recommendations  SNF;Supervision/Assistance - 24 hour     Equipment Recommendations  (TBD)    Recommendations for Other Services OT consult     Precautions / Restrictions Precautions Precautions: Fall Restrictions Weight Bearing Restrictions: No    Mobility  Bed Mobility Overal bed mobility: Needs Assistance Bed Mobility: Supine to Sit     Supine to sit: Min assist     General bed mobility comments: patient pulling up on PT, very light Min A to come to EOB  Transfers Overall transfer level: Needs assistance Equipment used: 2 person hand held assist Transfers: Sit to/from Stand Sit to Stand: Min assist         General transfer comment: Min A with patient pulling up on PT from bedside  Ambulation/Gait Ambulation/Gait assistance: Min assist;+2 physical assistance Gait Distance (Feet): 15 Feet Assistive device: 2 person hand held assist Gait Pattern/deviations: Step-to pattern;Decreased stride length;Drifts right/left;Trunk flexed Gait velocity: decreased   General Gait Details: 2 person HHA for short distance in room; Min A for guidance  and to continue forward progression   Stairs             Wheelchair Mobility    Modified Rankin (Stroke Patients Only)       Balance Overall balance assessment: Needs assistance Sitting-balance support: Feet supported;No upper extremity supported Sitting balance-Leahy Scale: Fair     Standing balance support: Bilateral upper extremity supported;During functional activity Standing balance-Leahy Scale: Poor                              Cognition Arousal/Alertness: Awake/alert Behavior During Therapy: Restless;Anxious Overall Cognitive Status: Impaired/Different from baseline Area of Impairment: Following commands;Problem solving                       Following Commands: Follows one step commands inconsistently;Follows one step commands with increased time     Problem Solving: Slow processing;Decreased initiation;Difficulty sequencing;Requires verbal cues;Requires tactile cues General Comments: repeatedly states "hold onto me" "I can't see"      Exercises      General Comments        Pertinent Vitals/Pain Pain Assessment: Faces Faces Pain Scale: No hurt    Home Living                      Prior Function            PT Goals (current goals can now be found in the care plan section) Acute Rehab PT Goals Patient Stated Goal: none stated PT Goal Formulation: With patient Time For Goal Achievement: 01/21/18 Potential to Achieve Goals: Fair Progress towards PT goals: Progressing toward  goals    Frequency    Min 3X/week      PT Plan Current plan remains appropriate    Co-evaluation              AM-PAC PT "6 Clicks" Daily Activity  Outcome Measure  Difficulty turning over in bed (including adjusting bedclothes, sheets and blankets)?: A Little Difficulty moving from lying on back to sitting on the side of the bed? : Unable Difficulty sitting down on and standing up from a chair with arms (e.g., wheelchair,  bedside commode, etc,.)?: Unable Help needed moving to and from a bed to chair (including a wheelchair)?: A Little Help needed walking in hospital room?: A Little Help needed climbing 3-5 steps with a railing? : A Lot 6 Click Score: 13    End of Session Equipment Utilized During Treatment: Gait belt Activity Tolerance: Patient tolerated treatment well Patient left: in chair;with call bell/phone within reach;with chair alarm set Nurse Communication: Mobility status PT Visit Diagnosis: Other abnormalities of gait and mobility (R26.89);Muscle weakness (generalized) (M62.81)     Time: 4174-0814 PT Time Calculation (min) (ACUTE ONLY): 17 min  Charges:  $Gait Training: 8-22 mins                    G Codes:       Stephanie Simmons, PT, DPT 01/09/18 4:42 PM Pager: [phone]

## 2018-01-09 NOTE — Progress Notes (Signed)
Progress Note    Stephanie Simmons  VEL:[id number] DOB: October 13, 1940  DOA: 01/06/2018 PCP: Leighton Ruff, MD    Brief Narrative:   Medical records reviewed and are as summarized below:  Stephanie Simmons is an 77 y.o. female with medical history significant of hypertension, hyperlipidemia, CHF, atrial fibrillation anticoagulated on Eliquis discharged on Friday after a nonhemorrhagic right cellebeller/PICA territory infarction.     Assessment/Plan:   Principal Problem:   Cerebral embolism with cerebral infarction Active Problems:   HTN (hypertension)   Atrial fibrillation (HCC)   Neurofibromatosis (HCC)   Acute on chronic diastolic CHF (congestive heart failure), NYHA class 3 (HCC)   Hyperlipidemia   Stroke ([place])   Seizure (HCC)  Cerebral embolism with cerebral infarction- recurrent:  -MRI of the brain shows new CVAs -EEG w/o seizure focus -CT of the chest abdomen and pelvis w/o possible cause -discussed plan with sister-- continue eliquis for now  Possible seizure:  -EEG w/o current seizure like activity  Hyperkalemia -from [place] administration -kay-exelate and d/c Kdur for now   Hypertension:  -permissive HTN  Perm. Atrial fibrillation continue Eliquis and metoprolol.  Restart diltiazem as soon as possible.  Acute on  chronic diastolic congestive heart failure New York Heart Association class III:  -IV lasix today, resume PO   Hyperlipidemia -continue atorvastatin.  Severe pulmonary HTN -being sent to Dr. Aundra Dubin as an outpatient  Poor overall prognosis  Family Communication/Anticipated D/C date and plan/Code Status   DVT prophylaxis: elquis Code Status: DNR  Family Communication: called sister 7/17 Disposition Plan: back to SNF in AM   Medical Consultants:    neuro     Subjective:   More agitated today  Objective:    Vitals:   01/08/18 2337 01/09/18 0401 01/09/18 0848 01/09/18 1129  BP: 113/66 114/82 117/72 97/70  Pulse: 64  62 74 80  Resp: 20 20 20 20   Temp: 97.8 F (36.6 C) 98 F (36.7 C) 98.4 F (36.9 C) (!) 97.5 F (36.4 C)  TempSrc: Axillary Axillary Axillary Axillary  SpO2: 95% 93%  95%  Weight:      Height:        Intake/Output Summary (Last 24 hours) at 01/09/2018 1422 Last data filed at 01/09/2018 1300 Gross per 24 hour  Intake 460 ml  Output -  Net 460 ml   Filed Weights   01/06/18 1240  Weight: 53.3 kg (117 lb 8.1 oz)    Exam: Saying more word, asking for something to be done about her dentures irr No wheezing, no increased work of breathing Nodules on skin, no rashes  Data Reviewed:   I have personally reviewed following labs and imaging studies:  Labs: Labs show the following:   Basic Metabolic Panel: Recent Labs  Lab 01/03/18 0526 01/04/18 0426 01/06/18 1339 01/06/18 1353 01/08/18 0642 01/09/18 0423  NA 136 135 132* 134* 136 139  K 5.0 4.9 4.6 4.9 6.4* 4.0  CL 105 105 100 100 110 108  CO2 20* 24 24  --  19* 22  GLUCOSE 105* 102* 112* 111* 97 138*  BUN 13 9 8 9 19  27*  CREATININE 0.80 0.65 0.73 0.60 0.91 1.00  CALCIUM 9.0 9.1 9.3  --  9.1 9.0   GFR Estimated Creatinine Clearance: 37.3 mL/min (by C-G formula based on SCr of 1 mg/dL). Liver Function Tests: Recent Labs  Lab 01/03/18 0526 01/06/18 1339  AST 31 24  ALT 30 32  ALKPHOS 41 64  BILITOT 1.0 0.8  PROT  5.3* 6.2*  ALBUMIN 2.9* 3.1*   No results for input(s): LIPASE, AMYLASE in the last 168 hours. No results for input(s): AMMONIA in the last 168 hours. Coagulation profile Recent Labs  Lab 01/06/18 1339  INR 1.81    CBC: Recent Labs  Lab 01/06/18 1339 01/06/18 1353 01/08/18 0642 01/09/18 0423  WBC 10.5  --  11.9* 11.3*  NEUTROABS 7.4  --   --   --   HGB 15.6* 18.4* 16.1* 15.0  HCT 50.8* 54.0* 51.7* 49.0*  MCV 82.3  --  80.5 82.2  PLT 345  --  388 348   Cardiac Enzymes: No results for input(s): CKTOTAL, CKMB, CKMBINDEX, TROPONINI in the last 168 hours. BNP (last 3 results) No  results for input(s): PROBNP in the last 8760 hours. CBG: Recent Labs  Lab 01/03/18 1125 01/03/18 1555 01/03/18 2145 01/04/18 1108 01/06/18 1342  GLUCAP 118* 111* 117* 135* 106*   D-Dimer: No results for input(s): DDIMER in the last 72 hours. Hgb A1c: Recent Labs    01/07/18 0742  HGBA1C 6.2*   Lipid Profile: Recent Labs    01/07/18 0742  CHOL 135  HDL 35*  LDLCALC 88  TRIG 59  CHOLHDL 3.9   Thyroid function studies: No results for input(s): TSH, T4TOTAL, T3FREE, THYROIDAB in the last 72 hours.  Invalid input(s): FREET3 Anemia work up: No results for input(s): VITAMINB12, FOLATE, FERRITIN, TIBC, IRON, RETICCTPCT in the last 72 hours. Sepsis Labs: Recent Labs  Lab 01/06/18 1339 01/08/18 0642 01/09/18 0423  WBC 10.5 11.9* 11.3*    Microbiology Recent Results (from the past 240 hour(s))  MRSA PCR Screening     Status: None   Collection Time: 01/08/18  7:39 PM  Result Value Ref Range Status   MRSA by PCR NEGATIVE NEGATIVE Final    Comment:        The GeneXpert MRSA Assay (FDA approved for NASAL specimens only), is one component of a comprehensive MRSA colonization surveillance program. It is not intended to diagnose MRSA infection nor to guide or monitor treatment for MRSA infections. Performed at [facility], [place] [address], [place], [place] [address]     Procedures and diagnostic studies:  Ct Chest W Contrast  Result Date: 01/07/2018 CLINICAL DATA:  77 y.o. African American female with PMH of HTN, HLD, LVHP, AF on eliquis, hypothyroidism, recent right PICA stroke and loop placed for syncope presented as code stroke. CT use ordered to evaluate for underlying malignancy given recurrent strokes on anticoagulation. EXAM: CT CHEST, ABDOMEN, AND PELVIS WITH CONTRAST TECHNIQUE: Multidetector CT imaging of the chest, abdomen and pelvis was performed following the standard protocol during bolus administration of intravenous contrast. CONTRAST:  124mL  OMNIPAQUE IOHEXOL 300 MG/ML  SOLN COMPARISON:  Chest radiographs 01/01/2018. FINDINGS: CT CHEST FINDINGS Cardiovascular: There is atherosclerosis of the aorta, great vessels and coronary arteries. No evidence of acute pulmonary embolism. The central pulmonary arteries are enlarged, consistent with pulmonary arterial hypertension. The heart is enlarged, especially both atria. There is no pericardial effusion. Mediastinum/Nodes: There are no enlarged mediastinal, hilar or axillary lymph nodes. The thyroid gland is mildly enlarged and heterogeneous, most consistent with a goiter. There is some fluid in the esophagus which is not significantly distended. Lungs/Pleura: No significant pleural effusion. There is pleural thickening and calcification posteriorly in the left hemithorax. There is moderate centrilobular and paraseptal emphysema. Patient is status post left thoracotomy, and surgical clips are present at the left apex. There is scattered subpleural scarring  throughout the left lung. No suspicious pulmonary nodule. Musculoskeletal/Chest wall: Left thoracotomy defect with associated rib deformities. No chest wall mass or suspicious osseous findings. Mild edema throughout the soft tissues. CT ABDOMEN AND PELVIS FINDINGS Hepatobiliary: No focal hepatic abnormality. There is prominent reflux of contrast from the right atrium into the IVC and hepatic veins. There is increased density within the gallbladder lumen, likely vicarious excretion of contrast. No evidence of gallstones, gallbladder wall thickening or biliary dilatation. Pancreas: Unremarkable. No pancreatic ductal dilatation or surrounding inflammatory changes. Spleen: Normal in size without focal abnormality. Adrenals/Urinary Tract: Both adrenal glands appear normal. There is cortical scarring in the lower pole of the right kidney. No evidence of urinary tract calculus, renal mass or hydronephrosis. There is contrast material in the bladder which otherwise  appears unremarkable. Stomach/Bowel: The stomach and small bowel appear normal. There is mild wall thickening throughout the proximal colon. No evidence of bowel obstruction. The appendix appears normal. Vascular/Lymphatic: There are no enlarged abdominal or pelvic lymph nodes. There is extensive aortic and branch vessel atherosclerosis with a long segment occlusion of the right external iliac artery. There is distal reconstitution of the right femoral artery. Reproductive: Hysterectomy.  No adnexal mass. Other: There is a small amount of pelvic and right pericolic gutter ascites. There is generalized edema throughout the subcutaneous and intra-abdominal fat. Musculoskeletal: No acute or significant osseous findings. Mild degenerative changes in the spine. IMPRESSION: 1. No evidence of malignancy within the chest, abdomen or pelvis. 2. Anasarca with generalized soft tissue edema and ascites. This may account for proximal colonic wall thickening. Alternatively, this could reflect colitis. 3. Cardiomegaly with prominent reflux of contrast into the IVC and hepatic veins consistent with right heart failure. Central enlargement of the pulmonary arteries consistent with pulmonary arterial hypertension. 4. Aortic Atherosclerosis (ICD10-I70.0). Long segment occlusion of the right common iliac artery with distal reconstitution. 5. Possible posttraumatic and postsurgical changes in the left hemithorax with associated pleural calcification and subpleural scarring. Emphysema (ICD10-J43.9). Electronically Signed   By: Richardean Sale M.D.   On: 01/07/2018 16:05   Ct Abdomen Pelvis W Contrast  Result Date: 01/07/2018 CLINICAL DATA:  77 y.o. African American female with PMH of HTN, HLD, LVHP, AF on eliquis, hypothyroidism, recent right PICA stroke and loop placed for syncope presented as code stroke. CT use ordered to evaluate for underlying malignancy given recurrent strokes on anticoagulation. EXAM: CT CHEST, ABDOMEN, AND  PELVIS WITH CONTRAST TECHNIQUE: Multidetector CT imaging of the chest, abdomen and pelvis was performed following the standard protocol during bolus administration of intravenous contrast. CONTRAST:  163mL OMNIPAQUE IOHEXOL 300 MG/ML  SOLN COMPARISON:  Chest radiographs 01/01/2018. FINDINGS: CT CHEST FINDINGS Cardiovascular: There is atherosclerosis of the aorta, great vessels and coronary arteries. No evidence of acute pulmonary embolism. The central pulmonary arteries are enlarged, consistent with pulmonary arterial hypertension. The heart is enlarged, especially both atria. There is no pericardial effusion. Mediastinum/Nodes: There are no enlarged mediastinal, hilar or axillary lymph nodes. The thyroid gland is mildly enlarged and heterogeneous, most consistent with a goiter. There is some fluid in the esophagus which is not significantly distended. Lungs/Pleura: No significant pleural effusion. There is pleural thickening and calcification posteriorly in the left hemithorax. There is moderate centrilobular and paraseptal emphysema. Patient is status post left thoracotomy, and surgical clips are present at the left apex. There is scattered subpleural scarring throughout the left lung. No suspicious pulmonary nodule. Musculoskeletal/Chest wall: Left thoracotomy defect with associated rib deformities. No chest wall  mass or suspicious osseous findings. Mild edema throughout the soft tissues. CT ABDOMEN AND PELVIS FINDINGS Hepatobiliary: No focal hepatic abnormality. There is prominent reflux of contrast from the right atrium into the IVC and hepatic veins. There is increased density within the gallbladder lumen, likely vicarious excretion of contrast. No evidence of gallstones, gallbladder wall thickening or biliary dilatation. Pancreas: Unremarkable. No pancreatic ductal dilatation or surrounding inflammatory changes. Spleen: Normal in size without focal abnormality. Adrenals/Urinary Tract: Both adrenal glands  appear normal. There is cortical scarring in the lower pole of the right kidney. No evidence of urinary tract calculus, renal mass or hydronephrosis. There is contrast material in the bladder which otherwise appears unremarkable. Stomach/Bowel: The stomach and small bowel appear normal. There is mild wall thickening throughout the proximal colon. No evidence of bowel obstruction. The appendix appears normal. Vascular/Lymphatic: There are no enlarged abdominal or pelvic lymph nodes. There is extensive aortic and branch vessel atherosclerosis with a long segment occlusion of the right external iliac artery. There is distal reconstitution of the right femoral artery. Reproductive: Hysterectomy.  No adnexal mass. Other: There is a small amount of pelvic and right pericolic gutter ascites. There is generalized edema throughout the subcutaneous and intra-abdominal fat. Musculoskeletal: No acute or significant osseous findings. Mild degenerative changes in the spine. IMPRESSION: 1. No evidence of malignancy within the chest, abdomen or pelvis. 2. Anasarca with generalized soft tissue edema and ascites. This may account for proximal colonic wall thickening. Alternatively, this could reflect colitis. 3. Cardiomegaly with prominent reflux of contrast into the IVC and hepatic veins consistent with right heart failure. Central enlargement of the pulmonary arteries consistent with pulmonary arterial hypertension. 4. Aortic Atherosclerosis (ICD10-I70.0). Long segment occlusion of the right common iliac artery with distal reconstitution. 5. Possible posttraumatic and postsurgical changes in the left hemithorax with associated pleural calcification and subpleural scarring. Emphysema (ICD10-J43.9). Electronically Signed   By: Richardean Sale M.D.   On: 01/07/2018 16:05   Dg Chest Port 1 View  Result Date: 01/08/2018 CLINICAL DATA:  Hypoxia, history of CHF EXAM: PORTABLE CHEST 1 VIEW COMPARISON:  CT chest of 01/07/2018 and chest  x-ray of 01/01/2018 FINDINGS: Moderate cardiomegaly is again noted. No congestive heart failure is currently seen. No pleural effusion is noted. Left apical pleural thickening remains with left apical pleural sutures again noted. Pleural calcification again is noted at the left lung base. No bony abnormality is seen. IMPRESSION: No change in moderate cardiomegaly.  No CHF. Electronically Signed   By: Ivar Drape M.D.   On: 01/08/2018 12:14    Medications:   . apixaban  5 mg Oral BID  . atorvastatin  80 mg Oral QPM  . chlorhexidine  15 mL Mouth/Throat BID  . cholecalciferol  2,000 Units Oral Daily  . diltiazem  120 mg Oral Daily  . dorzolamide-timolol  1 drop Both Eyes BID  . FLUoxetine  20 mg Oral Daily  . latanoprost  1 drop Both Eyes QHS  . metoprolol succinate  25 mg Oral Daily  . pantoprazole  40 mg Oral Daily   Continuous Infusions:   LOS: 2 days   Geradine Girt  [facility]   *Please refer to [email or web address], password [email or web address] to get updated schedule on who will round on this patient, as hospitalists switch teams weekly. If 7PM-7AM, please contact night-coverage at [email or web address], password [email or web address] for any overnight needs.  01/09/2018, 2:22 PM

## 2018-01-09 NOTE — Clinical Social Work Note (Signed)
Clinical Social Work Assessment  Patient Details  Name: Stephanie Simmons MRN: [id number] Date of Birth: 1941/03/22  Date of referral:  01/09/18               Reason for consult:  Facility Placement, Discharge Planning                Permission sought to share information with:  Family Supports Permission granted to share information::  Yes, Verbal Permission Granted  Name::     Electronics engineer::  [name]  Relationship::  sister  Contact Information:  [phone]  Housing/Transportation Living arrangements for the past 2 months:  [place], [place] of Information:  Siblings Patient Interpreter Needed:  None Criminal Activity/Legal Involvement Pertinent to Current Situation/Hospitalization:  No - Comment as needed Significant Relationships:  Other Family Members, Siblings Lives with:  Self Do you feel safe going back to the place where you live?  Yes Need for family participation in patient care:  Yes (Comment)  Care giving concerns:  CSW spoke to patient sister Stephanie Simmons via phone since patient is slighty  confused. Stephanie Simmons stated that patient came from [facility] and was only at facility for two night prior to coming back to the hospital  Social Worker assessment / plan:  Sullivan Lone stated that patient lives alone but has support from her and other family members. Stephanie Simmons stated she is agreeable for patient to discharge back to [facility] to try and get started with there rehab program. Sullivan Lone stated she would like t be present during discharge process's to be able to learn what is needed for patient.  Employment status:  Retired Forensic scientist:  [employer] PT Recommendations:  [place] / Referral to community resources:  [place]  Patient/Family's Response to care:  Family appreciate CSW reaching out to them to discuss discharge plan  Patient/Family's Understanding of and Emotional Response to Diagnosis,  Current Treatment, and Prognosis: Family agreeable with discharge plan   Emotional Assessment Appearance:  Appears stated age Attitude/Demeanor/Rapport:  Engaged Affect (typically observed):  Unable to Assess Orientation:  Oriented to Self, Oriented to Situation, Oriented to Place, Oriented to  Time Alcohol / Substance use:  Not Applicable Psych involvement (Current and /or in the community):  No (Comment)  Discharge Needs  Concerns to be addressed:  Care Coordination Readmission within the last 30 days:  Yes Current discharge risk:  Dependent with Mobility, Lives alone, Physical Impairment Barriers to Discharge:  Continued Medical Work up   [employer], LCSW 01/09/2018, 3:46 PM

## 2018-01-09 NOTE — Progress Notes (Signed)
Occupational Therapy Treatment Patient Details Name: Stephanie Simmons MRN: [id number] DOB: [birth year] Today's Date: 01/09/2018    History of present illness Patient is a 77 y/o female presenting with aphasia, L gaze preference, facial droop and mild R arm drift. Currently undergoing neuro work-up. Of note, patient discharged from [facility] on 01/04/18 after a nonhemorrhagic right sella Beller/PICA territory infarction. Patient with a PMH significant for hypertension, hyperlipidemia, CHF, atrial fibrillation anticoagulated on Eliquis.    OT comments  Pt required min-mod assist for sitting balance at EOB today. Attempted to engage pt in grooming tasks, despite max multimodal cues, pt not initiating participation in ADL--requires total hand over hand assist. Pt following <25% of commands during session and perseverating on "hold me" and "take this off" referring to IV lines. D/c plan remains appropriate. Will continue to follow acutely.   Follow Up Recommendations  SNF    Equipment Recommendations  None recommended by OT    Recommendations for Other Services      Precautions / Restrictions Precautions Precautions: Fall Restrictions Weight Bearing Restrictions: No       Mobility Bed Mobility Overal bed mobility: Needs Assistance Bed Mobility: Supine to Sit;Sit to Supine     Supine to sit: Mod assist Sit to supine: Min assist   General bed mobility comments: Pt reaching for therapist in attempts to sit up; mod HHA to trunk elevation and min assist for controlled return to supine.  Transfers                      Balance Overall balance assessment: Needs assistance Sitting-balance support: Feet supported;Bilateral upper extremity supported Sitting balance-Leahy Scale: Poor Sitting balance - Comments: min-mod support throughout, posterior and R lateral lean Postural control: Posterior lean;Right lateral lean                                 ADL either performed  or assessed with clinical judgement   ADL Overall ADL's : Needs assistance/impaired     Grooming: Total assistance;Sitting;Oral care;Wash/dry face Grooming Details (indicate cue type and reason): Hand over hand assist with no initaition of particiapting in activity by pt. Max multimodal cues throghout. Pt able to spontaneously put in dentures that fell out on her lap.                               General ADL Comments: Min-mod assist for sitting balance at EOB. Pt perseverating on lines and stating "hold me" "get this off" repeateadly.     Vision       Perception     Praxis      Cognition Arousal/Alertness: Awake/alert Behavior During Therapy: Restless Overall Cognitive Status: Impaired/Different from baseline Area of Impairment: Following commands;Problem solving                       Following Commands: Follows one step commands inconsistently     Problem Solving: Slow processing;Decreased initiation;Difficulty sequencing;Requires verbal cues;Requires tactile cues General Comments: Following commands <25% of the time. Repeating "please get this off" and "hold me". Unable to name objects or participate in functional activitites        Exercises     Shoulder Instructions       General Comments      Pertinent Vitals/ Pain       Pain Assessment: Faces Faces Pain  Scale: No hurt  Home Living                                          Prior Functioning/Environment              Frequency  Min 2X/week        Progress Toward Goals  OT Goals(current goals can now be found in the care plan section)  Progress towards OT goals: Not progressing toward goals - comment(limited by cognitive impairments)  Acute Rehab OT Goals Patient Stated Goal: none stated OT Goal Formulation: Patient unable to participate in goal setting  Plan Discharge plan remains appropriate    Co-evaluation                 AM-PAC PT "6  Clicks" Daily Activity     Outcome Measure   Help from another person eating meals?: Total Help from another person taking care of personal grooming?: Total Help from another person toileting, which includes using toliet, bedpan, or urinal?: Total Help from another person bathing (including washing, rinsing, drying)?: Total Help from another person to put on and taking off regular upper body clothing?: Total Help from another person to put on and taking off regular lower body clothing?: Total 6 Click Score: 6    End of Session    OT Visit Diagnosis: Unsteadiness on feet (R26.81);Cognitive communication deficit (R41.841) Symptoms and signs involving cognitive functions: Cerebral infarction   Activity Tolerance Patient tolerated treatment well   Patient Left in bed;with call bell/phone within reach;with bed alarm set;with restraints reapplied   Nurse Communication          Time: 5053-9767 OT Time Calculation (min): 16 min  Charges: OT General Charges $OT Visit: 1 Visit OT Treatments $Self Care/Home Management : 8-22 mins  Askia Hazelip A. Ulice Brilliant, M.S., OTR/L Acute Rehab Department: [phone]  Binnie Kand 01/09/2018, 10:42 AM

## 2018-01-10 ENCOUNTER — Encounter (HOSPITAL_COMMUNITY): Payer: Medicare Other | Admitting: Cardiology

## 2018-01-10 DIAGNOSIS — I69319 Unspecified symptoms and signs involving cognitive functions following cerebral infarction: Secondary | ICD-10-CM | POA: Diagnosis not present

## 2018-01-10 DIAGNOSIS — R2689 Other abnormalities of gait and mobility: Secondary | ICD-10-CM | POA: Diagnosis not present

## 2018-01-10 DIAGNOSIS — R634 Abnormal weight loss: Secondary | ICD-10-CM | POA: Diagnosis not present

## 2018-01-10 DIAGNOSIS — R41841 Cognitive communication deficit: Secondary | ICD-10-CM | POA: Diagnosis not present

## 2018-01-10 DIAGNOSIS — Z515 Encounter for palliative care: Secondary | ICD-10-CM | POA: Diagnosis not present

## 2018-01-10 DIAGNOSIS — Z7401 Bed confinement status: Secondary | ICD-10-CM | POA: Diagnosis not present

## 2018-01-10 DIAGNOSIS — R4020 Unspecified coma: Secondary | ICD-10-CM | POA: Diagnosis not present

## 2018-01-10 DIAGNOSIS — R0689 Other abnormalities of breathing: Secondary | ICD-10-CM | POA: Diagnosis not present

## 2018-01-10 DIAGNOSIS — R278 Other lack of coordination: Secondary | ICD-10-CM | POA: Diagnosis not present

## 2018-01-10 DIAGNOSIS — I69322 Dysarthria following cerebral infarction: Secondary | ICD-10-CM | POA: Diagnosis not present

## 2018-01-10 DIAGNOSIS — R404 Transient alteration of awareness: Secondary | ICD-10-CM | POA: Diagnosis not present

## 2018-01-10 DIAGNOSIS — I10 Essential (primary) hypertension: Secondary | ICD-10-CM | POA: Diagnosis not present

## 2018-01-10 DIAGNOSIS — E875 Hyperkalemia: Secondary | ICD-10-CM | POA: Diagnosis not present

## 2018-01-10 DIAGNOSIS — R498 Other voice and resonance disorders: Secondary | ICD-10-CM | POA: Diagnosis not present

## 2018-01-10 DIAGNOSIS — I6340 Cerebral infarction due to embolism of unspecified cerebral artery: Secondary | ICD-10-CM | POA: Diagnosis not present

## 2018-01-10 DIAGNOSIS — I5033 Acute on chronic diastolic (congestive) heart failure: Secondary | ICD-10-CM | POA: Diagnosis not present

## 2018-01-10 DIAGNOSIS — F329 Major depressive disorder, single episode, unspecified: Secondary | ICD-10-CM | POA: Diagnosis not present

## 2018-01-10 DIAGNOSIS — I69391 Dysphagia following cerebral infarction: Secondary | ICD-10-CM | POA: Diagnosis not present

## 2018-01-10 DIAGNOSIS — R000 Tachycardia, unspecified: Secondary | ICD-10-CM | POA: Diagnosis not present

## 2018-01-10 DIAGNOSIS — R1312 Dysphagia, oropharyngeal phase: Secondary | ICD-10-CM | POA: Diagnosis not present

## 2018-01-10 DIAGNOSIS — I4891 Unspecified atrial fibrillation: Secondary | ICD-10-CM | POA: Diagnosis not present

## 2018-01-10 DIAGNOSIS — R0902 Hypoxemia: Secondary | ICD-10-CM | POA: Diagnosis not present

## 2018-01-10 DIAGNOSIS — I63412 Cerebral infarction due to embolism of left middle cerebral artery: Secondary | ICD-10-CM | POA: Diagnosis not present

## 2018-01-10 DIAGNOSIS — Q8500 Neurofibromatosis, unspecified: Secondary | ICD-10-CM | POA: Diagnosis not present

## 2018-01-10 DIAGNOSIS — M2550 Pain in unspecified joint: Secondary | ICD-10-CM | POA: Diagnosis not present

## 2018-01-10 DIAGNOSIS — I482 Chronic atrial fibrillation: Secondary | ICD-10-CM | POA: Diagnosis not present

## 2018-01-10 DIAGNOSIS — F419 Anxiety disorder, unspecified: Secondary | ICD-10-CM | POA: Diagnosis not present

## 2018-01-10 DIAGNOSIS — M6281 Muscle weakness (generalized): Secondary | ICD-10-CM | POA: Diagnosis not present

## 2018-01-10 DIAGNOSIS — I499 Cardiac arrhythmia, unspecified: Secondary | ICD-10-CM | POA: Diagnosis not present

## 2018-01-10 MED ORDER — DILTIAZEM HCL ER COATED BEADS 120 MG PO CP24: 120.0000 mg | ORAL_CAPSULE | Freq: Every day | ORAL | Status: AC

## 2018-01-10 MED ORDER — IPRATROPIUM-ALBUTEROL 0.5-2.5 (3) MG/3ML IN SOLN: 3.0000 mL | Freq: Four times a day (QID) | RESPIRATORY_TRACT | Status: AC | PRN

## 2018-01-10 MED ORDER — POTASSIUM CHLORIDE CRYS ER 20 MEQ PO TBCR: 40.0000 meq | EXTENDED_RELEASE_TABLET | Freq: Every day | ORAL | Status: AC

## 2018-01-10 MED ORDER — SENNOSIDES-DOCUSATE SODIUM 8.6-50 MG PO TABS: 1.0000 | ORAL_TABLET | Freq: Every evening | ORAL | Status: AC | PRN

## 2018-01-10 NOTE — Clinical Social Work Placement (Signed)
   CLINICAL SOCIAL WORK PLACEMENT  NOTE [name]  Date:  01/10/2018  Patient Details  Name: Stephanie Simmons MRN: [id number] Date of Birth: Aug 09, 1940  Clinical Social Work is seeking post-discharge placement for this patient at the [facility] level of care (*CSW will initial, date and re-position this form in  chart as items are completed):      Patient/family provided with [facility] Work Department's list of facilities offering this level of care within the geographic area requested by the patient (or if unable, by the patient's family).  Yes   Patient/family informed of their freedom to choose among providers that offer the needed level of care, that participate in Medicare, Medicaid or managed care program needed by the patient, have an available bed and are willing to accept the patient.      Patient/family informed of [facility]'s ownership interest in [facility] and [facility], as well as of the fact that they are under no obligation to receive care at these facilities.  PASRR submitted to EDS on       PASRR number received on       Existing PASRR number confirmed on 01/08/18     FL2 transmitted to all facilities in geographic area requested by pt/family on 01/08/18     FL2 transmitted to all facilities within larger geographic area on       Patient informed that his/her managed care company has contracts with or will negotiate with certain facilities, including the following:        Yes   Patient/family informed of bed offers received.  Patient chooses bed at [facility]     Physician recommends and patient chooses bed at      Patient to be transferred to [facility] on 01/10/18.  Patient to be transferred to facility by [facility]     Patient family notified on 01/10/18 of transfer.  Name of family member notified:  sister by phone [name]     PHYSICIAN       Additional Comment:     _______________________________________________ Alexander Mt, [place] 01/10/2018, 3:07 PM

## 2018-01-10 NOTE — Progress Notes (Signed)
  Speech Language Pathology Treatment: Dysphagia;Cognitive-Linquistic  Patient Details Name: Stephanie Simmons MRN: [id number] DOB: [birth year] Today's Date: 01/10/2018 Time: 0950-1006 SLP Time Calculation (min) (ACUTE ONLY): 16 min  Assessment / Plan / Recommendation Clinical Impression  Pt more alert today, improved eye contact and initiation.  Language comprehension is primary barrier - pt followed one command (to close eyes), unable to discriminate between two functional objects, unable to repeat or sing familiar songs in unison.  Pt is making more effort to initiate communication - phrases are marked by paraphasias, with constant perseverative output leading to occasional shift in a word or phoneme.  Maximal multimodal cues are ineffective in shifting her verbal output.  Pt does show improved use of prosody/intonational patterns in her efforts to convey meaning, but expressions are significantly impaired.     Swallowing appears functional - she is able to drink from cup/straw with no s/s of aspiration.  Pt will need intensive SLP f/u at SNF to address receptive aphasia and to progress diet.     Likely d/c back to SNF today or tomorrow per notes.   HPI HPI: Patient is a 77 y/o female presenting with aphasia, L gaze preference, facial droop and mild R arm drift. Currently undergoing neuro work-up. Of note, patient discharged from [facility] on 01/04/18 after a nonhemorrhagic right sella Beller/PICA territory infarction. Patient with a PMH significant for hypertension, hyperlipidemia, CHF, atrial fibrillation anticoagulated on Eliquis.       SLP Plan  Continue with current plan of care       Recommendations  Diet recommendations: Dysphagia 1 (puree);Thin liquid Liquids provided via: Straw;Cup Medication Administration: Crushed with puree Supervision: Staff to assist with self feeding Compensations: Minimize environmental distractions;Slow rate;Small sips/bites;Lingual sweep for clearance of  pocketing                Oral Care Recommendations: Oral care BID Follow up Recommendations: Skilled Nursing facility SLP Visit Diagnosis: Dysphagia, unspecified (R13.10);Aphasia (R47.01) Plan: Continue with current plan of care       GO                Juan Quam Laurice 01/10/2018, 10:21 AM

## 2018-01-10 NOTE — Progress Notes (Signed)
Patient discharging to [facility]. All belongings sent with her sister. Nurse called report and spoke with Quillian Quince.

## 2018-01-10 NOTE — Social Work (Addendum)
Clinical Social Worker facilitated patient discharge including contacting patient family and facility to confirm patient discharge plans.  Clinical information faxed to facility and family agreeable with plan.  CSW arranged ambulance transport via [facility] to [place]. Pick up at 1:30.  RN to call [phone] with report  prior to discharge.  Clinical Social Worker will sign off for now as social work intervention is no longer needed. Please consult [place] again if new need arises.  Alexander Mt, [place] Social Worker [phone]

## 2018-01-10 NOTE — Discharge Summary (Signed)
Physician Discharge Summary  Stephanie Simmons QJJ:[id number] DOB: [birth year] DOA: 01/06/2018  PCP: Leighton Ruff, MD  Admit date: 01/06/2018 Discharge date: 01/10/2018  Admitted From: SNF Discharge disposition: SNF   Recommendations for Outpatient Follow-Up:   1. DNR 2. DYS 1 thin liquids 3. outpatient follow up with Dr. Aundra Dubin for pHTN 4. Bmp, cbc 1 week 5. Continued palliative care conversations-- overall poor prognosis   Discharge Diagnosis:   Principal Problem:   Cerebral embolism with cerebral infarction Active Problems:   HTN (hypertension)   Atrial fibrillation (HCC)   Neurofibromatosis (HCC)   Acute on chronic diastolic CHF (congestive heart failure), NYHA class 3 ([place])   Hyperlipidemia   Stroke ([place])   Seizure [facility])    Discharge Condition: stable     History of Present Illness:  Stephanie Simmons is a 77 y.o. female with medical history significant of hypertension, hyperlipidemia, CHF, atrial fibrillation anticoagulated on Eliquis discharged on Friday after a nonhemorrhagic right sella Beller/PICA territory infarction.  Neurology had followed patient while she was hospitalized.  And her Eliquis was continued.  Apparently prior to admission she had missed a few days of Eliquis.  She presented to our emergency department today with a aphasia a left gaze preference and not communicating with her sister.  This was noted at 10:20 AM.  EMS was activated.  She was last seen at baseline at 9:00 this morning when medication including Eliquis was given.  On route to [facility] the patient had a right facial twitching that lasted about 45 seconds and then she had a right facial droop and mild right arm drift.  On arrival her right facial droop and right arm drift were resolved.  She still has a left facial droop which resolved in from her stroke last week.  She still had a left gaze preference and aphasia when she was seen by Dr. Beatriz Chancellor from neurology but when I saw the  patient she was moving her head to the left and to the right in fact turned her head to the right to see me and she was speaking although fluidly it was not contextually appropriate.     Hospital Course by Problem:   Cerebral embolism with cerebral infarction- recurrent  -MRI of the brain shows new CVAs -EEG w/o seizure focus -CT of the chest abdomen and pelvis w/o possible cause -discussed plan with sister-- continue eliquis for now -SLP: Language comprehension is primary barrier - pt followed one command (to close eyes), unable to discriminate between two functional objects, unable to repeat or sing familiar songs in unison.  Pt is making more effort to initiate communication - phrases are marked by paraphasias, with constant perseverative output leading to occasional shift in a word or phoneme.  Maximal multimodal cues are ineffective in shifting her verbal output.  Pt does show improved use of prosody/intonational patterns in her efforts to convey meaning, but expressions are significantly impaired.    Possible seizure:  -EEG w/o current seizure like activity  Hyperkalemia -resolved -Kdur dose adjusted  Hypertension:  -permissive HTN  Perm. Atrial fibrillation continue Eliquis and metoprolol. Restart diltiazem   Acute on  chronic diastolic congestive heart failure New York Heart Association class III:  -IV lasix today, resume PO  Hyperlipidemia -continue atorvastatin.  Severe pulmonary HTN -being sent to Dr. Aundra Dubin as an outpatient      Medical Consultants:   neuro   Discharge Exam:   Vitals:   01/10/18 0340 01/10/18 0845  BP:  118/71 107/70  Pulse: 81 67  Resp: 18 18  Temp: 98.1 F (36.7 C) (!) 97.5 F (36.4 C)  SpO2: 98% 95%   Vitals:   01/09/18 2027 01/10/18 0003 01/10/18 0340 01/10/18 0845  BP: 101/76 113/79 118/71 107/70  Pulse: 81 79 81 67  Resp: 18 18 18 18   Temp: 98.3 F (36.8 C) 98 F (36.7 C) 98.1 F (36.7 C) (!) 97.5 F (36.4 C)   TempSrc: Axillary Axillary Axillary Oral  SpO2: 96% 96% 98% 95%  Weight:      Height:        General exam: in bed, talking but not making sense.  Frustrated at times  Called sister and left message  The results of significant diagnostics from this hospitalization (including imaging, microbiology, ancillary and laboratory) are listed below for reference.     Procedures and Diagnostic Studies:   Ct Angio Head W Or Wo Contrast  Result Date: 01/06/2018 CLINICAL DATA:  Stroke.  Aphasia and right facial droop EXAM: CT ANGIOGRAPHY HEAD AND NECK TECHNIQUE: Multidetector CT imaging of the head and neck was performed using the standard protocol during bolus administration of intravenous contrast. Multiplanar CT image reconstructions and MIPs were obtained to evaluate the vascular anatomy. Carotid stenosis measurements (when applicable) are obtained utilizing NASCET criteria, using the distal internal carotid diameter as the denominator. CONTRAST:  67mL ISOVUE-370 IOPAMIDOL (ISOVUE-370) INJECTION 76% COMPARISON:  CT head 01/06/2018 FINDINGS: CTA NECK FINDINGS Aortic arch: Minimal atherosclerotic disease aortic arch. Proximal great vessels widely patent. Left vertebral artery origin from the arch. Right carotid system: Normal right carotid. Negative for carotid stenosis or atherosclerotic disease. Left carotid system: Normal left carotid without stenosis or atherosclerotic disease Vertebral arteries: Both vertebral arteries widely patent without stenosis or atherosclerotic disease Skeleton: Negative Other neck: Negative for mass or adenopathy. Thyroid goiter with calcifications and nodules. Upper chest: Apical emphysema and subpleural blebs. No acute infiltrate or mass. Postsurgical changes in left apex with resection. Review of the MIP images confirms the above findings CTA HEAD FINDINGS Anterior circulation: Mild atherosclerotic disease in the cavernous carotid bilaterally without significant stenosis.  Anterior and middle cerebral arteries are patent bilaterally without occlusion. Small inferior branch of the middle cerebral artery bilaterally. Intracranial vessels are diffusely small but without focal stenosis. Posterior circulation: Both vertebral arteries patent to the basilar. Basilar widely patent. PICA, superior cerebellar, posterior cerebral arteries patent bilaterally. Posterior communicating artery patent bilaterally. Venous sinuses: Negative Anatomic variants: None Delayed phase: Not perform Review of the MIP images confirms the above findings IMPRESSION: 1. Negative CTA of the neck.  No carotid or vertebral stenosis 2. No significant intracranial stenosis. No emergent large vessel occlusion. Relatively small caliber intracranial circulation. 3. Thyroid goiter Electronically Signed   By: Franchot Gallo M.D.   On: 01/06/2018 13:22   Ct Angio Neck W Or Wo Contrast  Result Date: 01/06/2018 CLINICAL DATA:  Stroke.  Aphasia and right facial droop EXAM: CT ANGIOGRAPHY HEAD AND NECK TECHNIQUE: Multidetector CT imaging of the head and neck was performed using the standard protocol during bolus administration of intravenous contrast. Multiplanar CT image reconstructions and MIPs were obtained to evaluate the vascular anatomy. Carotid stenosis measurements (when applicable) are obtained utilizing NASCET criteria, using the distal internal carotid diameter as the denominator. CONTRAST:  92mL ISOVUE-370 IOPAMIDOL (ISOVUE-370) INJECTION 76% COMPARISON:  CT head 01/06/2018 FINDINGS: CTA NECK FINDINGS Aortic arch: Minimal atherosclerotic disease aortic arch. Proximal great vessels widely patent. Left vertebral artery origin from the arch. Right  carotid system: Normal right carotid. Negative for carotid stenosis or atherosclerotic disease. Left carotid system: Normal left carotid without stenosis or atherosclerotic disease Vertebral arteries: Both vertebral arteries widely patent without stenosis or atherosclerotic  disease Skeleton: Negative Other neck: Negative for mass or adenopathy. Thyroid goiter with calcifications and nodules. Upper chest: Apical emphysema and subpleural blebs. No acute infiltrate or mass. Postsurgical changes in left apex with resection. Review of the MIP images confirms the above findings CTA HEAD FINDINGS Anterior circulation: Mild atherosclerotic disease in the cavernous carotid bilaterally without significant stenosis. Anterior and middle cerebral arteries are patent bilaterally without occlusion. Small inferior branch of the middle cerebral artery bilaterally. Intracranial vessels are diffusely small but without focal stenosis. Posterior circulation: Both vertebral arteries patent to the basilar. Basilar widely patent. PICA, superior cerebellar, posterior cerebral arteries patent bilaterally. Posterior communicating artery patent bilaterally. Venous sinuses: Negative Anatomic variants: None Delayed phase: Not perform Review of the MIP images confirms the above findings IMPRESSION: 1. Negative CTA of the neck.  No carotid or vertebral stenosis 2. No significant intracranial stenosis. No emergent large vessel occlusion. Relatively small caliber intracranial circulation. 3. Thyroid goiter Electronically Signed   By: Franchot Gallo M.D.   On: 01/06/2018 13:22   Ct Chest W Contrast  Result Date: 01/07/2018 CLINICAL DATA:  77 y.o. African American female with PMH of HTN, HLD, LVHP, AF on eliquis, hypothyroidism, recent right PICA stroke and loop placed for syncope presented as code stroke. CT use ordered to evaluate for underlying malignancy given recurrent strokes on anticoagulation. EXAM: CT CHEST, ABDOMEN, AND PELVIS WITH CONTRAST TECHNIQUE: Multidetector CT imaging of the chest, abdomen and pelvis was performed following the standard protocol during bolus administration of intravenous contrast. CONTRAST:  131mL OMNIPAQUE IOHEXOL 300 MG/ML  SOLN COMPARISON:  Chest radiographs 01/01/2018. FINDINGS:  CT CHEST FINDINGS Cardiovascular: There is atherosclerosis of the aorta, great vessels and coronary arteries. No evidence of acute pulmonary embolism. The central pulmonary arteries are enlarged, consistent with pulmonary arterial hypertension. The heart is enlarged, especially both atria. There is no pericardial effusion. Mediastinum/Nodes: There are no enlarged mediastinal, hilar or axillary lymph nodes. The thyroid gland is mildly enlarged and heterogeneous, most consistent with a goiter. There is some fluid in the esophagus which is not significantly distended. Lungs/Pleura: No significant pleural effusion. There is pleural thickening and calcification posteriorly in the left hemithorax. There is moderate centrilobular and paraseptal emphysema. Patient is status post left thoracotomy, and surgical clips are present at the left apex. There is scattered subpleural scarring throughout the left lung. No suspicious pulmonary nodule. Musculoskeletal/Chest wall: Left thoracotomy defect with associated rib deformities. No chest wall mass or suspicious osseous findings. Mild edema throughout the soft tissues. CT ABDOMEN AND PELVIS FINDINGS Hepatobiliary: No focal hepatic abnormality. There is prominent reflux of contrast from the right atrium into the IVC and hepatic veins. There is increased density within the gallbladder lumen, likely vicarious excretion of contrast. No evidence of gallstones, gallbladder wall thickening or biliary dilatation. Pancreas: Unremarkable. No pancreatic ductal dilatation or surrounding inflammatory changes. Spleen: Normal in size without focal abnormality. Adrenals/Urinary Tract: Both adrenal glands appear normal. There is cortical scarring in the lower pole of the right kidney. No evidence of urinary tract calculus, renal mass or hydronephrosis. There is contrast material in the bladder which otherwise appears unremarkable. Stomach/Bowel: The stomach and small bowel appear normal. There is  mild wall thickening throughout the proximal colon. No evidence of bowel obstruction. The appendix appears normal. Vascular/Lymphatic:  There are no enlarged abdominal or pelvic lymph nodes. There is extensive aortic and branch vessel atherosclerosis with a long segment occlusion of the right external iliac artery. There is distal reconstitution of the right femoral artery. Reproductive: Hysterectomy.  No adnexal mass. Other: There is a small amount of pelvic and right pericolic gutter ascites. There is generalized edema throughout the subcutaneous and intra-abdominal fat. Musculoskeletal: No acute or significant osseous findings. Mild degenerative changes in the spine. IMPRESSION: 1. No evidence of malignancy within the chest, abdomen or pelvis. 2. Anasarca with generalized soft tissue edema and ascites. This may account for proximal colonic wall thickening. Alternatively, this could reflect colitis. 3. Cardiomegaly with prominent reflux of contrast into the IVC and hepatic veins consistent with right heart failure. Central enlargement of the pulmonary arteries consistent with pulmonary arterial hypertension. 4. Aortic Atherosclerosis (ICD10-I70.0). Long segment occlusion of the right common iliac artery with distal reconstitution. 5. Possible posttraumatic and postsurgical changes in the left hemithorax with associated pleural calcification and subpleural scarring. Emphysema (ICD10-J43.9). Electronically Signed   By: Richardean Sale M.D.   On: 01/07/2018 16:05   Mr Brain Wo Contrast  Result Date: 01/06/2018 CLINICAL DATA:  Followup stroke. Recent development of aphasia and right facial droop. Previous right PICA stroke. EXAM: MRI HEAD WITHOUT CONTRAST TECHNIQUE: Multiplanar, multiecho pulse sequences of the brain and surrounding structures were obtained without intravenous contrast. COMPARISON:  Multiple CT examinations same day. CT and MRI examinations 01/01/2018 FINDINGS: Brain: Diffusion imaging again shows  of all of inguinal far shin in the right posterior inferior cerebellar artery territory. No extension in that region. New subcentimeter acute infarction in the left cerebellum without swelling or hemorrhage. New 5 cm region of acute infarction in the left MCA territory affecting the deep insula and left parietal cortical and subcortical brain consistent with left MCA branch vessel occlusion. No swelling or hemorrhage. Small of all vein left posterior frontal cortical infarction since the previous exam. Elsewhere, there is chronic small-vessel ischemic change affecting the pons and cerebellar hemispheres. Cerebral hemispheres elsewhere show chronic small-vessel ischemic changes of the deep and subcortical white matter and some old small vessel infarctions of the thalami and basal ganglia. No hydrocephalus. No extra-axial collection. Vascular: Major vessels at the base of the brain show flow. Skull and upper cervical spine: Negative Sinuses/Orbits: Clear/normal Other: None IMPRESSION: New 5 cm region of acute infarction in the left deep insula and parietal lobe consistent with MCA branch vessel occlusion. New 1 cm acute infarction in the left cerebellum. Expected evolutionary changes within the right posterior inferior cerebellar artery territory stroke shown previously. No significant swelling or hemorrhage. Redemonstration of a minimal left posterior frontal cortical infarction without swelling or hemorrhage. Multiple vascular territory infarctions suggest embolic disease from the heart or ascending aorta. Chronic small-vessel ischemic changes throughout the brain appear unchanged. Electronically Signed   By: Nelson Chimes M.D.   On: 01/06/2018 17:30   Ct Abdomen Pelvis W Contrast  Result Date: 01/07/2018 CLINICAL DATA:  77 y.o. African American female with PMH of HTN, HLD, LVHP, AF on eliquis, hypothyroidism, recent right PICA stroke and loop placed for syncope presented as code stroke. CT use ordered to  evaluate for underlying malignancy given recurrent strokes on anticoagulation. EXAM: CT CHEST, ABDOMEN, AND PELVIS WITH CONTRAST TECHNIQUE: Multidetector CT imaging of the chest, abdomen and pelvis was performed following the standard protocol during bolus administration of intravenous contrast. CONTRAST:  133mL OMNIPAQUE IOHEXOL 300 MG/ML  SOLN COMPARISON:  Chest radiographs  01/01/2018. FINDINGS: CT CHEST FINDINGS Cardiovascular: There is atherosclerosis of the aorta, great vessels and coronary arteries. No evidence of acute pulmonary embolism. The central pulmonary arteries are enlarged, consistent with pulmonary arterial hypertension. The heart is enlarged, especially both atria. There is no pericardial effusion. Mediastinum/Nodes: There are no enlarged mediastinal, hilar or axillary lymph nodes. The thyroid gland is mildly enlarged and heterogeneous, most consistent with a goiter. There is some fluid in the esophagus which is not significantly distended. Lungs/Pleura: No significant pleural effusion. There is pleural thickening and calcification posteriorly in the left hemithorax. There is moderate centrilobular and paraseptal emphysema. Patient is status post left thoracotomy, and surgical clips are present at the left apex. There is scattered subpleural scarring throughout the left lung. No suspicious pulmonary nodule. Musculoskeletal/Chest wall: Left thoracotomy defect with associated rib deformities. No chest wall mass or suspicious osseous findings. Mild edema throughout the soft tissues. CT ABDOMEN AND PELVIS FINDINGS Hepatobiliary: No focal hepatic abnormality. There is prominent reflux of contrast from the right atrium into the IVC and hepatic veins. There is increased density within the gallbladder lumen, likely vicarious excretion of contrast. No evidence of gallstones, gallbladder wall thickening or biliary dilatation. Pancreas: Unremarkable. No pancreatic ductal dilatation or surrounding inflammatory  changes. Spleen: Normal in size without focal abnormality. Adrenals/Urinary Tract: Both adrenal glands appear normal. There is cortical scarring in the lower pole of the right kidney. No evidence of urinary tract calculus, renal mass or hydronephrosis. There is contrast material in the bladder which otherwise appears unremarkable. Stomach/Bowel: The stomach and small bowel appear normal. There is mild wall thickening throughout the proximal colon. No evidence of bowel obstruction. The appendix appears normal. Vascular/Lymphatic: There are no enlarged abdominal or pelvic lymph nodes. There is extensive aortic and branch vessel atherosclerosis with a long segment occlusion of the right external iliac artery. There is distal reconstitution of the right femoral artery. Reproductive: Hysterectomy.  No adnexal mass. Other: There is a small amount of pelvic and right pericolic gutter ascites. There is generalized edema throughout the subcutaneous and intra-abdominal fat. Musculoskeletal: No acute or significant osseous findings. Mild degenerative changes in the spine. IMPRESSION: 1. No evidence of malignancy within the chest, abdomen or pelvis. 2. Anasarca with generalized soft tissue edema and ascites. This may account for proximal colonic wall thickening. Alternatively, this could reflect colitis. 3. Cardiomegaly with prominent reflux of contrast into the IVC and hepatic veins consistent with right heart failure. Central enlargement of the pulmonary arteries consistent with pulmonary arterial hypertension. 4. Aortic Atherosclerosis (ICD10-I70.0). Long segment occlusion of the right common iliac artery with distal reconstitution. 5. Possible posttraumatic and postsurgical changes in the left hemithorax with associated pleural calcification and subpleural scarring. Emphysema (ICD10-J43.9). Electronically Signed   By: Richardean Sale M.D.   On: 01/07/2018 16:05   Ct Head Code Stroke Wo Contrast  Result Date:  01/06/2018 CLINICAL DATA:  Code stroke.  Aphasia right facial droop EXAM: CT HEAD WITHOUT CONTRAST TECHNIQUE: Contiguous axial images were obtained from the base of the skull through the vertex without intravenous contrast. COMPARISON:  CT head 01/01/2018 FINDINGS: Brain: Generalized atrophy. Right cerebellar infarct shows progressive volume loss compared with the prior study. Mild chronic microvascular ischemia in the white matter. No acute cortically based infarct. Negative for hemorrhage or mass. Vascular: Negative for hyperdense vessel Skull: Negative Sinuses/Orbits: Paranasal sinuses clear.  Bilateral cataract surgery Other: None ASPECTS ([place] Stroke Program Early CT Score) - Ganglionic level infarction (caudate, lentiform nuclei, internal capsule, insula,  M1-M3 cortex): 7 - Supraganglionic infarction (M4-M6 cortex): 3 Total score (0-10 with 10 being normal): 10 IMPRESSION: 1. Negative for acute infarct or hemorrhage 2. Contracting right cerebellar infarct which is a recent infarct as noted on recent studies. 3. ASPECTS is 10 4. These results were called by telephone at the time of interpretation on 01/06/2018 at 12:24 pm to Dr. Erlinda Hong , who verbally acknowledged these results. Electronically Signed   By: Franchot Gallo M.D.   On: 01/06/2018 12:24     Labs:   Basic Metabolic Panel: Recent Labs  Lab 01/04/18 0426 01/06/18 1339 01/06/18 1353 01/08/18 0642 01/09/18 0423  NA 135 132* 134* 136 139  K 4.9 4.6 4.9 6.4* 4.0  CL 105 100 100 110 108  CO2 24 24  --  19* 22  GLUCOSE 102* 112* 111* 97 138*  BUN 9 8 9 19  27*  CREATININE 0.65 0.73 0.60 0.91 1.00  CALCIUM 9.1 9.3  --  9.1 9.0   GFR Estimated Creatinine Clearance: 37.3 mL/min (by C-G formula based on SCr of 1 mg/dL). Liver Function Tests: Recent Labs  Lab 01/06/18 1339  AST 24  ALT 32  ALKPHOS 64  BILITOT 0.8  PROT 6.2*  ALBUMIN 3.1*   No results for input(s): LIPASE, AMYLASE in the last 168 hours. No results for input(s):  AMMONIA in the last 168 hours. Coagulation profile Recent Labs  Lab 01/06/18 1339  INR 1.81    CBC: Recent Labs  Lab 01/06/18 1339 01/06/18 1353 01/08/18 0642 01/09/18 0423  WBC 10.5  --  11.9* 11.3*  NEUTROABS 7.4  --   --   --   HGB 15.6* 18.4* 16.1* 15.0  HCT 50.8* 54.0* 51.7* 49.0*  MCV 82.3  --  80.5 82.2  PLT 345  --  388 348   Cardiac Enzymes: No results for input(s): CKTOTAL, CKMB, CKMBINDEX, TROPONINI in the last 168 hours. BNP: Invalid input(s): POCBNP CBG: Recent Labs  Lab 01/03/18 1125 01/03/18 1555 01/03/18 2145 01/04/18 1108 01/06/18 1342  GLUCAP 118* 111* 117* 135* 106*   D-Dimer No results for input(s): DDIMER in the last 72 hours. Hgb A1c No results for input(s): HGBA1C in the last 72 hours. Lipid Profile No results for input(s): CHOL, HDL, LDLCALC, TRIG, CHOLHDL, LDLDIRECT in the last 72 hours. Thyroid function studies No results for input(s): TSH, T4TOTAL, T3FREE, THYROIDAB in the last 72 hours.  Invalid input(s): FREET3 Anemia work up No results for input(s): VITAMINB12, FOLATE, FERRITIN, TIBC, IRON, RETICCTPCT in the last 72 hours. Microbiology Recent Results (from the past 240 hour(s))  MRSA PCR Screening     Status: None   Collection Time: 01/08/18  7:39 PM  Result Value Ref Range Status   MRSA by PCR NEGATIVE NEGATIVE Final    Comment:        The GeneXpert MRSA Assay (FDA approved for NASAL specimens only), is one component of a comprehensive MRSA colonization surveillance program. It is not intended to diagnose MRSA infection nor to guide or monitor treatment for MRSA infections. Performed at [facility], [place] [address], [place], [place] [id number]      Discharge Instructions:   Discharge Instructions    Ambulatory referral to Neurology   Complete by:  As directed    Follow up with stroke clinic NP (Makynli Stills Vanschaick or Cecille Rubin, if both not available, consider Zachery Dauer, or [place]) at [facility] in about 4  weeks. Thanks.   Discharge instructions   Complete by:  As directed    DYS 1 thin liquids   Increase activity slowly   Complete by:  As directed      Allergies as of 01/10/2018   No Known Allergies     Medication List    STOP taking these medications   enalapril 10 MG tablet Commonly known as:  VASOTEC     TAKE these medications   acetaminophen 500 MG tablet Commonly known as:  TYLENOL Take 500 mg by mouth every 6 (six) hours as needed (pain).   atorvastatin 80 MG tablet Commonly known as:  LIPITOR Take 1 tablet (80 mg total) by mouth every evening.   bimatoprost 0.01 % Soln Commonly known as:  LUMIGAN Place 1 drop into both eyes at bedtime.   chlorhexidine 0.12 % solution Commonly known as:  PERIDEX Use as directed 15 mLs in the mouth or throat 2 (two) times daily.   dextromethorphan 30 MG/5ML liquid Commonly known as:  DELSYM Take 60 mg by mouth as needed for cough.   diltiazem 120 MG 24 hr capsule Commonly known as:  CARDIZEM CD Take 1 capsule (120 mg total) by mouth daily.   dorzolamide-timolol 22.3-6.8 MG/ML ophthalmic solution Commonly known as:  COSOPT Place 1 drop into both eyes 2 (two) times daily.   ELIQUIS 5 MG Tabs tablet Generic drug:  apixaban TAKE 1 TABLET BY MOUTH TWICE A DAY   FLUoxetine 20 MG capsule Commonly known as:  PROZAC Take 20 mg by mouth daily.   furosemide 40 MG tablet Commonly known as:  LASIX Take 1 tablet (40 mg total) by mouth 2 (two) times daily.   ipratropium-albuterol 0.5-2.5 (3) MG/3ML Soln Commonly known as:  DUONEB Take 3 mLs by nebulization every 6 (six) hours as needed.   metoprolol succinate 25 MG 24 hr tablet Commonly known as:  TOPROL-XL TAKE 1 TABLET (25 MG TOTAL) BY MOUTH DAILY. TAKE WITH OR IMMEDIATELY FOLLOWING A MEAL.   MURO 128 OP Place 1 drop into both eyes 3 (three) times daily.   NEXIUM 24HR 20 MG Tbec Generic drug:  Esomeprazole Magnesium Take 20 mg by mouth daily.   potassium chloride SA 20  MEQ tablet Commonly known as:  KLOR-CON M20 Take 2 tablets (40 mEq total) by mouth daily. What changed:  See the new instructions.   senna-docusate 8.6-50 MG tablet Commonly known as:  Senokot-S Take 1 tablet by mouth at bedtime as needed for mild constipation.   SYSTANE 0.4-0.3 % Soln Generic drug:  Polyethyl Glycol-Propyl Glycol Place 1 drop into both eyes as needed (dry eyes).   Vitamin D 2000 units tablet Take 2,000 Units by mouth daily.       Contact information for follow-up providers    [facility]. Schedule an appointment as soon as possible for a visit in 4 week(s).   Specialty:  Radiology Contact information: [address] [place] [phone]           Contact information for after-discharge care    Destination    [name] .   Service:  Skilled Nursing Contact information: [place] [phone]                   Time coordinating discharge: 35 min  Signed:  Geradine Girt  [facility] 01/10/2018, 10:36 AM

## 2018-01-10 NOTE — Care Management Important Message (Signed)
Important Message  Patient Details  Name: Stephanie Simmons MRN: [id number] Date of Birth: 1940-10-30   Medicare Important Message Given:  Yes    Orbie Pyo 01/10/2018, 2:32 PM

## 2018-01-11 DIAGNOSIS — I6340 Cerebral infarction due to embolism of unspecified cerebral artery: Secondary | ICD-10-CM | POA: Diagnosis not present

## 2018-01-11 DIAGNOSIS — Q8500 Neurofibromatosis, unspecified: Secondary | ICD-10-CM | POA: Diagnosis not present

## 2018-01-11 DIAGNOSIS — I10 Essential (primary) hypertension: Secondary | ICD-10-CM | POA: Diagnosis not present

## 2018-01-11 DIAGNOSIS — I4891 Unspecified atrial fibrillation: Secondary | ICD-10-CM | POA: Diagnosis not present

## 2018-01-16 DIAGNOSIS — I10 Essential (primary) hypertension: Secondary | ICD-10-CM | POA: Diagnosis not present

## 2018-01-16 DIAGNOSIS — I4891 Unspecified atrial fibrillation: Secondary | ICD-10-CM | POA: Diagnosis not present

## 2018-01-16 DIAGNOSIS — I6340 Cerebral infarction due to embolism of unspecified cerebral artery: Secondary | ICD-10-CM | POA: Diagnosis not present

## 2018-01-16 DIAGNOSIS — Q8500 Neurofibromatosis, unspecified: Secondary | ICD-10-CM | POA: Diagnosis not present

## 2018-01-17 NOTE — ED Provider Notes (Addendum)
Stephanie Simmons   CSN: [id number] Arrival date & time: 01/06/18  1158     History   Chief Complaint Chief Complaint  Patient presents with  . Code Stroke    HPI Stephanie Simmons is a 77 y.o. female.  HPI   77 y.o. female with medical history significant of hypertension, hyperlipidemia, CHF, atrial fibrillation anticoagulated on Eliquis discharged on Friday after a nonhemorrhagic right sella Beller/PICA territory infarction.  Neurology had followed patient while she was hospitalized.  And her Eliquis was continued.  Apparently prior to admission she had missed a few days of Eliquis.  She presented to our emergency department today with a aphasia a left gaze preference and not communicating with her sister.  This was noted at 10:20 AM.  EMS was activated.  She was last seen at baseline at 9:00 this morning when medication including Eliquis was given.  On route to [facility] the patient had a right facial twitching that lasted about 45 seconds and then she had a right facial droop and mild right arm drift.  On arrival her right facial droop and right arm drift were resolved.    Past Medical History:  Diagnosis Date  . Chest pain    a. 04/2009 Cath: essentially nl cors, EF 65%, mod LVH.  Marland Kitchen CHF (congestive heart failure) ([place])   . CVA (cerebral vascular accident) ([place])   . GERD (gastroesophageal reflux disease)   . Hyperlipidemia   . Hypertension   . Hypothyroidism    does not take medication for this any longer per patient   . LVH (left ventricular hypertrophy)    a. 04/2009 Echo: EF 55-65%, mild conc LVH, no reg wma, Gr 1 DD, mild MR.  . Monocytosis 04/21/2014    Patient Active Problem List   Diagnosis Date Noted  . Cerebral embolism with cerebral infarction 01/06/2018  . Stroke ([place]) 01/06/2018  . Seizure ([place]) 01/06/2018  . Hyperlipidemia 01/02/2018  . Elevated troponin 01/02/2018  . CVA (cerebral vascular accident) ([place]) 01/01/2018  .  Chest pain, atypical 09/23/2014  . Acute on chronic diastolic CHF (congestive heart failure), NYHA class 3 ([place]) 09/23/2014  . Acute diastolic heart failure ([place]) 09/23/2014  . Monocytosis 04/21/2014  . Neurofibromatosis ([place]) 04/21/2014  . Poor oral hygiene 04/21/2014  . Atrial fibrillation ([place]) 04/15/2014  . PSVT (paroxysmal supraventricular tachycardia) ([place]) 09/25/2013  . Syncope 09/10/2013  . Atrial flutter ([place]) 09/10/2013  . HTN (hypertension) 09/10/2013    Past Surgical History:  Procedure Laterality Date  . ABDOMINAL HYSTERECTOMY    . LOOP RECORDER INSERTION N/A 10/16/2017   Procedure: LOOP RECORDER INSERTION;  Surgeon: Evans Lance, MD;  Location: [facility];  Service: Cardiovascular;  Laterality: N/A;  . MASS EXCISION     removal of mass- pleural      OB History   None      Home Medications    Prior to Admission medications   Medication Sig Start Date End Date Taking? Authorizing Provider  acetaminophen (TYLENOL) 500 MG tablet Take 500 mg by mouth every 6 (six) hours as needed (pain).   Yes [provider]  atorvastatin (LIPITOR) 80 MG tablet Take 1 tablet (80 mg total) by mouth every evening. 01/04/18  Yes Gherghe, Costin M, MD  bimatoprost (LUMIGAN) 0.01 % SOLN Place 1 drop into both eyes at bedtime.   Yes [provider]  chlorhexidine (PERIDEX) 0.12 % solution Use as directed 15 mLs in the mouth or  throat 2 (two) times daily.   Yes [provider]  Cholecalciferol (VITAMIN D) 2000 units tablet Take 2,000 Units by mouth daily.   Yes [provider]  dextromethorphan (DELSYM) 30 MG/5ML liquid Take 60 mg by mouth as needed for cough.   Yes [provider]  dorzolamide-timolol (COSOPT) 22.3-6.8 MG/ML ophthalmic solution Place 1 drop into both eyes 2 (two) times daily.  08/28/17  Yes [provider]  ELIQUIS 5 MG TABS tablet TAKE 1 TABLET BY MOUTH TWICE A DAY 12/25/17  Yes Evans Lance, MD  Esomeprazole  Magnesium (NEXIUM 24HR) 20 MG TBEC Take 20 mg by mouth daily.    Yes [provider]  FLUoxetine (PROZAC) 20 MG capsule Take 20 mg by mouth daily. 12/26/17  Yes [provider]  furosemide (LASIX) 40 MG tablet Take 1 tablet (40 mg total) by mouth 2 (two) times daily. 01/04/18  Yes Gherghe, Vella Redhead, MD  metoprolol succinate (TOPROL-XL) 25 MG 24 hr tablet TAKE 1 TABLET (25 MG TOTAL) BY MOUTH DAILY. TAKE WITH OR IMMEDIATELY FOLLOWING A MEAL. 10/01/17  Yes Evans Lance, MD  Polyethyl Glycol-Propyl Glycol (SYSTANE) 0.4-0.3 % SOLN Place 1 drop into both eyes as needed (dry eyes).    Yes [provider]  Sodium Chloride, Hypertonic, (MURO 128 OP) Place 1 drop into both eyes 3 (three) times daily.    Yes [provider]  diltiazem (CARDIZEM CD) 120 MG 24 hr capsule Take 1 capsule (120 mg total) by mouth daily. 01/10/18   Geradine Girt, DO  ipratropium-albuterol (DUONEB) 0.5-2.5 (3) MG/3ML SOLN Take 3 mLs by nebulization every 6 (six) hours as needed. 01/10/18   Geradine Girt, DO  potassium chloride SA (KLOR-CON M20) 20 MEQ tablet Take 2 tablets (40 mEq total) by mouth daily. 01/10/18   Geradine Girt, DO  senna-docusate (SENOKOT-S) 8.6-50 MG tablet Take 1 tablet by mouth at bedtime as needed for mild constipation. 01/10/18   Geradine Girt, DO    Family History Family History  Problem Relation Age of Onset  . Other Mother        s/p PPM, died @ 25  . Heart attack Father        died @ 10.  . Other Father        enlarged heart   . Stroke Sister     Social History Social History   Tobacco Use  . Smoking status: Former Smoker    Types: Cigarettes    Last attempt to quit: 06/26/1992    Years since quitting: 25.5  . Smokeless tobacco: Never Used  Substance Use Topics  . Alcohol use: No  . Drug use: No     Allergies   Patient has no known allergies.   Review of Systems Review of Systems  All systems reviewed and negative, other than as noted in  HPI.  Physical Exam Updated Vital Signs BP 107/70 (BP Location: Right Arm)   Pulse 67   Temp (!) 97.5 F (36.4 C) (Oral)   Resp 18   Ht 5\' 2"  (1.575 m)   Wt 53.3 kg (117 lb 8.1 oz)   SpO2 95%   BMI 21.49 kg/m   Physical Exam  Constitutional: She appears well-developed and well-nourished. No distress.  HENT:  Head: Normocephalic and atraumatic.  Eyes: Conjunctivae are normal. Right eye exhibits no discharge. Left eye exhibits no discharge.  Neck: Neck supple.  Cardiovascular: Normal rate, regular rhythm and normal heart sounds. Exam reveals  no gallop and no friction rub.  No murmur heard. Pulmonary/Chest: Effort normal and breath sounds normal. No respiratory distress.  Abdominal: Soft. She exhibits no distension. There is no tenderness.  Musculoskeletal: She exhibits no edema or tenderness.  Neurological: She is alert.  Left-sided weakness  Skin: Skin is warm and dry.  Nursing Simmons and vitals reviewed.    ED Treatments / Results  Labs (all labs ordered are listed, but only abnormal results are displayed) Labs Reviewed  PROTIME-INR - Abnormal; Notable for the following components:      Result Value   Prothrombin Time 20.8 (*)    All other components within normal limits  APTT - Abnormal; Notable for the following components:   aPTT 47 (*)    All other components within normal limits  CBC - Abnormal; Notable for the following components:   RBC 6.17 (*)    Hemoglobin 15.6 (*)    HCT 50.8 (*)    MCH 25.3 (*)    RDW 18.7 (*)    All other components within normal limits  DIFFERENTIAL - Abnormal; Notable for the following components:   Monocytes Absolute 1.5 (*)    All other components within normal limits  COMPREHENSIVE METABOLIC PANEL - Abnormal; Notable for the following components:   Sodium 132 (*)    Glucose, Bld 112 (*)    Total Protein 6.2 (*)    Albumin 3.1 (*)    All other components within normal limits  HEMOGLOBIN A1C - Abnormal; Notable for the  following components:   Hgb A1c MFr Bld 6.2 (*)    All other components within normal limits  LIPID PANEL - Abnormal; Notable for the following components:   HDL 35 (*)    All other components within normal limits  CBC - Abnormal; Notable for the following components:   WBC 11.9 (*)    RBC 6.42 (*)    Hemoglobin 16.1 (*)    HCT 51.7 (*)    MCH 25.1 (*)    RDW 19.2 (*)    All other components within normal limits  BASIC METABOLIC PANEL - Abnormal; Notable for the following components:   Potassium 6.4 (*)    CO2 19 (*)    GFR calc non Af Amer 59 (*)    All other components within normal limits  CBC - Abnormal; Notable for the following components:   WBC 11.3 (*)    RBC 5.96 (*)    HCT 49.0 (*)    MCH 25.2 (*)    RDW 18.6 (*)    All other components within normal limits  BASIC METABOLIC PANEL - Abnormal; Notable for the following components:   Glucose, Bld 138 (*)    BUN 27 (*)    GFR calc non Af Amer 53 (*)    All other components within normal limits  CBG MONITORING, ED - Abnormal; Notable for the following components:   Glucose-Capillary 106 (*)    All other components within normal limits  I-STAT CHEM 8, ED - Abnormal; Notable for the following components:   Sodium 134 (*)    Glucose, Bld 111 (*)    Hemoglobin 18.4 (*)    HCT 54.0 (*)    All other components within normal limits  MRSA PCR SCREENING  I-STAT TROPONIN, ED    EKG EKG Interpretation  Date/Time:  Sunday January 06 2018 12:37:24 EDT Ventricular Rate:  87 PR Interval:    QRS Duration: 129 QT Interval:  395 QTC Calculation: 470 R  Axis:   125 Text Interpretation:  Atrial fibrillation Consider left ventricular hypertrophy Anterior Q waves, possibly due to LVH Confirmed by Virgel Manifold [phone]) on 01/06/2018 2:46:34 PM   Radiology No results found.  Procedures Procedures (including critical care time)  Medications Ordered in ED Medications  iopamidol (ISOVUE-370) 76 % injection (  Canceled Entry  01/06/18 1828)  iopamidol (ISOVUE-370) 76 % injection 50 mL (50 mLs Intravenous Contrast Given 01/06/18 1300)   stroke: mapping our early stages of recovery book ( Does not apply Given 01/06/18 1856)  iohexol (OMNIPAQUE) 300 MG/ML solution 100 mL (100 mLs Intravenous Contrast Given 01/07/18 1538)  sodium polystyrene (KAYEXALATE) powder 30 g (30 g Oral Given 01/08/18 0903)  furosemide (LASIX) injection 40 mg (40 mg Intravenous Given 01/08/18 1738)     Initial Impression / Assessment and Plan / ED Course  I have reviewed the triage vital signs and the nursing notes.  Pertinent labs & imaging results that were available during my care of the patient were reviewed by me and considered in my medical decision making (see chart for details).     77 year old female with symptoms of a CVA.  She was just recently admitted at the for the same.  She was evaluated emergently again by neurology who is recommending admission for further testing.  Final Clinical Impressions(s) / ED Diagnoses   Final diagnoses:  Acute embolic stroke ([place])  Embolic stroke ([place])  Cerebral infarction due to embolism of left middle cerebral artery [facility])      ED Discharge Orders        Ordered    diltiazem (CARDIZEM CD) 120 MG 24 hr capsule  Daily     01/10/18 1036    senna-docusate (SENOKOT-S) 8.6-50 MG tablet  At bedtime PRN     01/10/18 1036    potassium chloride SA (KLOR-CON M20) 20 MEQ tablet  Daily     01/10/18 1036    ipratropium-albuterol (DUONEB) 0.5-2.5 (3) MG/3ML SOLN  Every 6 hours PRN     01/10/18 1036    Increase activity slowly     01/10/18 1036    Discharge instructions    Comments:  DYS 1 thin liquids   01/10/18 1036    Ambulatory referral to Neurology    Comments:  Follow up with stroke clinic NP (Jessica [place] or Cecille Rubin, if both not available, consider Zachery Dauer, or [place]) at [facility] in about 4 weeks. Thanks.   01/07/18 1721       Virgel Manifold, MD 01/17/18 6415    Virgel Manifold, MD 01/17/18 [phone]

## 2018-01-18 DIAGNOSIS — I10 Essential (primary) hypertension: Secondary | ICD-10-CM | POA: Diagnosis not present

## 2018-01-18 DIAGNOSIS — I6340 Cerebral infarction due to embolism of unspecified cerebral artery: Secondary | ICD-10-CM | POA: Diagnosis not present

## 2018-01-18 DIAGNOSIS — F329 Major depressive disorder, single episode, unspecified: Secondary | ICD-10-CM | POA: Diagnosis not present

## 2018-01-18 DIAGNOSIS — E875 Hyperkalemia: Secondary | ICD-10-CM | POA: Diagnosis not present

## 2018-01-22 DIAGNOSIS — I69319 Unspecified symptoms and signs involving cognitive functions following cerebral infarction: Secondary | ICD-10-CM | POA: Diagnosis not present

## 2018-01-22 DIAGNOSIS — R634 Abnormal weight loss: Secondary | ICD-10-CM | POA: Diagnosis not present

## 2018-01-22 DIAGNOSIS — I69322 Dysarthria following cerebral infarction: Secondary | ICD-10-CM | POA: Diagnosis not present

## 2018-01-22 DIAGNOSIS — Z515 Encounter for palliative care: Secondary | ICD-10-CM | POA: Diagnosis not present

## 2018-01-22 DIAGNOSIS — I69391 Dysphagia following cerebral infarction: Secondary | ICD-10-CM | POA: Diagnosis not present

## 2018-01-23 DIAGNOSIS — I6340 Cerebral infarction due to embolism of unspecified cerebral artery: Secondary | ICD-10-CM | POA: Diagnosis not present

## 2018-01-23 DIAGNOSIS — F329 Major depressive disorder, single episode, unspecified: Secondary | ICD-10-CM | POA: Diagnosis not present

## 2018-01-23 DIAGNOSIS — F419 Anxiety disorder, unspecified: Secondary | ICD-10-CM | POA: Diagnosis not present

## 2018-01-25 DIAGNOSIS — R0902 Hypoxemia: Secondary | ICD-10-CM | POA: Diagnosis not present

## 2018-02-07 ENCOUNTER — Telehealth: Payer: Self-pay | Admitting: Adult Health

## 2018-02-07 ENCOUNTER — Telehealth: Payer: Self-pay | Admitting: Internal Medicine

## 2018-02-07 NOTE — Telephone Encounter (Signed)
Noted.  Thank you for letting me know.

## 2018-02-07 NOTE — Telephone Encounter (Signed)
Pts sister Sullivan Lone called to inform the office the pt passed away as of 02/18/18.

## 2018-02-07 NOTE — Telephone Encounter (Signed)
New Message:      Stephanie Simmons is calling and would like to speak with the RN. She did not disclose what she would like to discuss pertaining to the pt.

## 2018-02-07 NOTE — Telephone Encounter (Signed)
Monitor return kit ordered to patient's sister's address on file.

## 2018-02-07 NOTE — Telephone Encounter (Signed)
Returned call to Pt sister.  Per sister Pt is deceased.  Sister would like a return kit for Pt's loop equipment.  Will send to sister.

## 2018-02-12 ENCOUNTER — Ambulatory Visit: Payer: Self-pay | Admitting: Adult Health

## 2018-02-24 DEATH — deceased

## 2018-11-07 IMAGING — US US THYROID
1 series · 13 of 25 positions shown · non-contrast
Comparison: 10/23/2011 and 05/16/2004

CLINICAL DATA: Goiter.  Multinodular goiter.

EXAM:
THYROID ULTRASOUND
TECHNIQUE: Ultrasound examination of the thyroid gland and adjacent soft
tissues was performed.

[Series 1: us thyroid · 0.08mm/px · 13 of 62 slices shown]
[im 1/62]
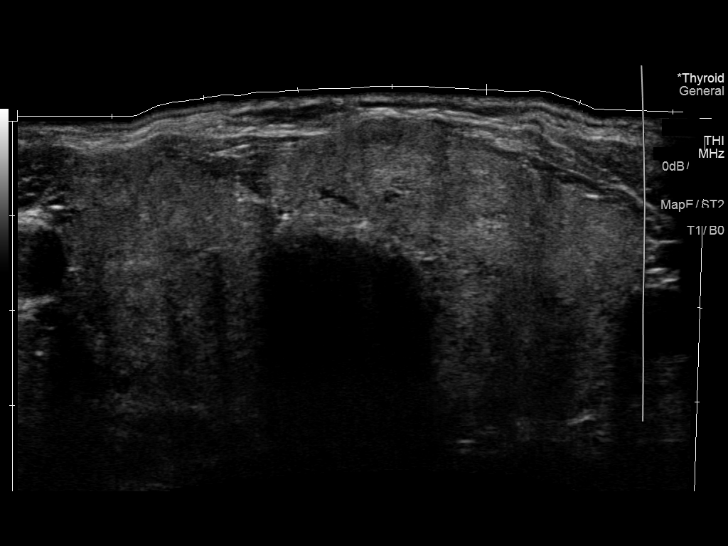
[im 6/62]
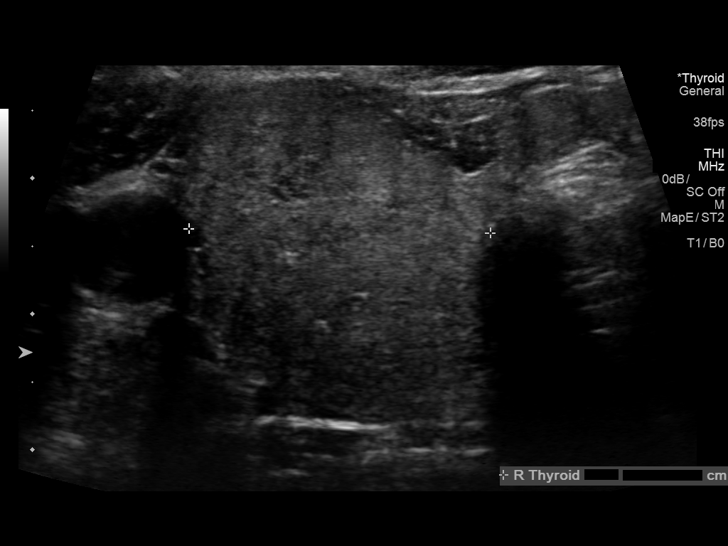
[im 11/62]
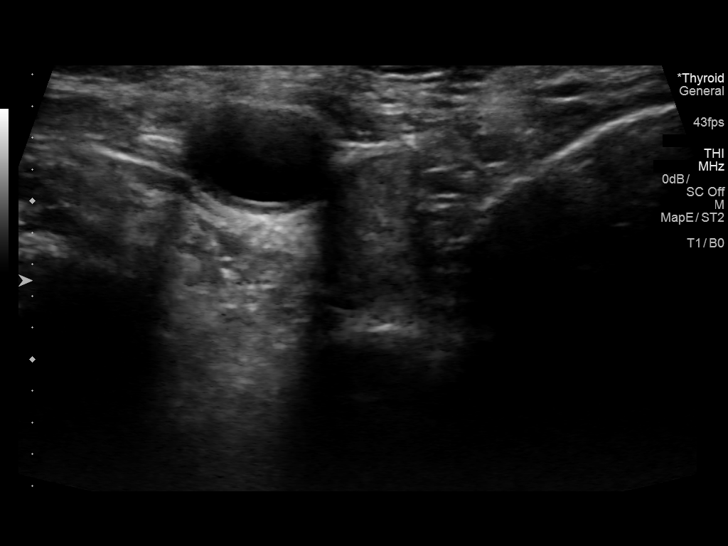
[im 16/62]
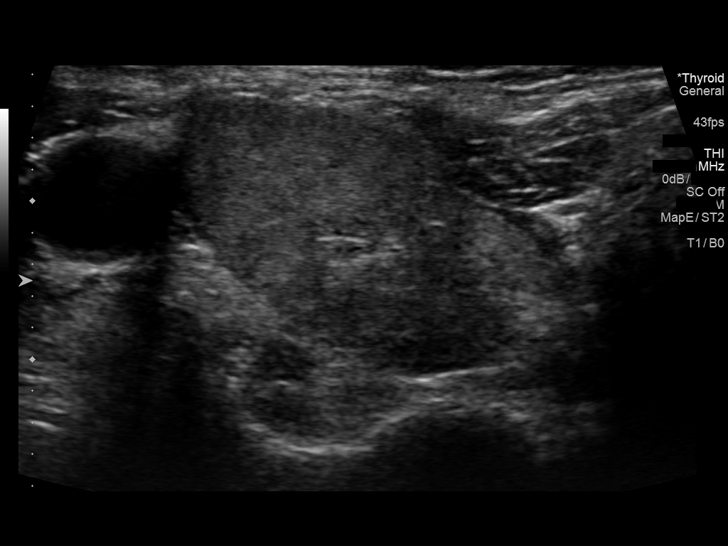
[im 21/62]
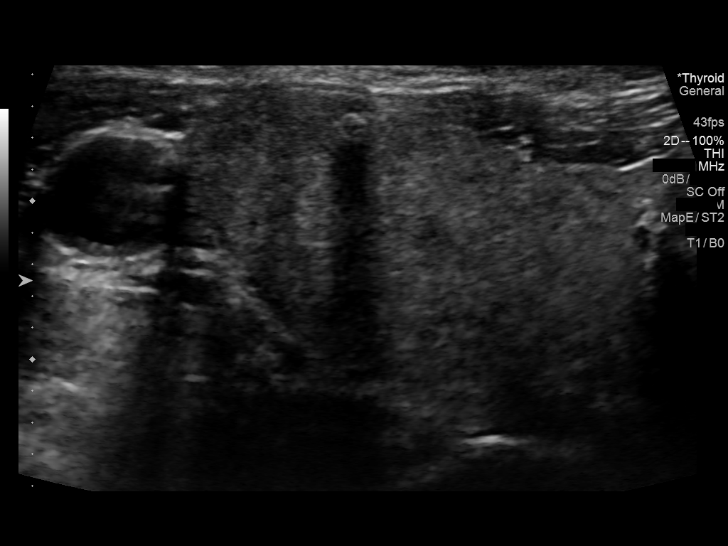
[im 26/62]
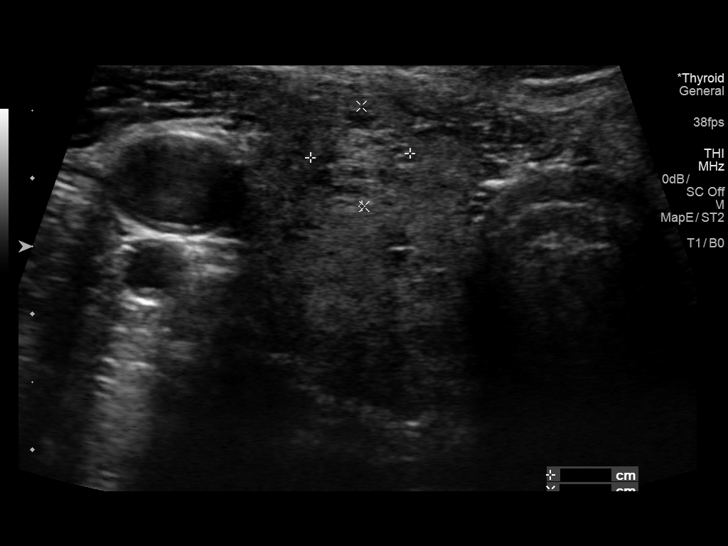
[im 31/62]
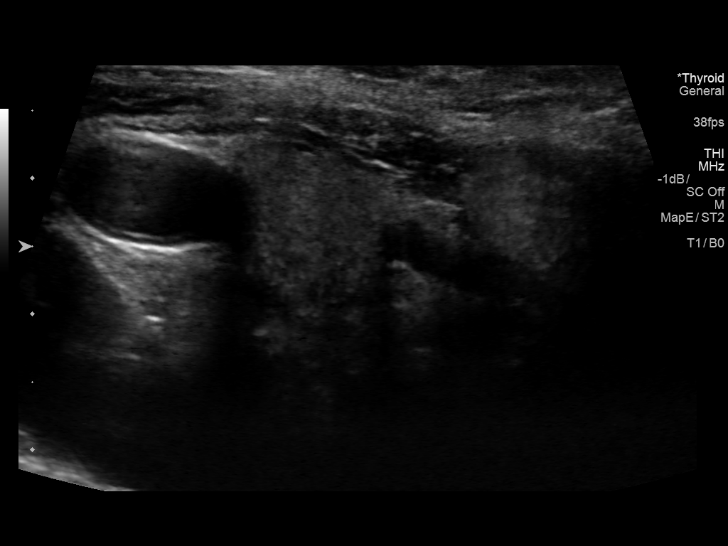
[im 36/62]
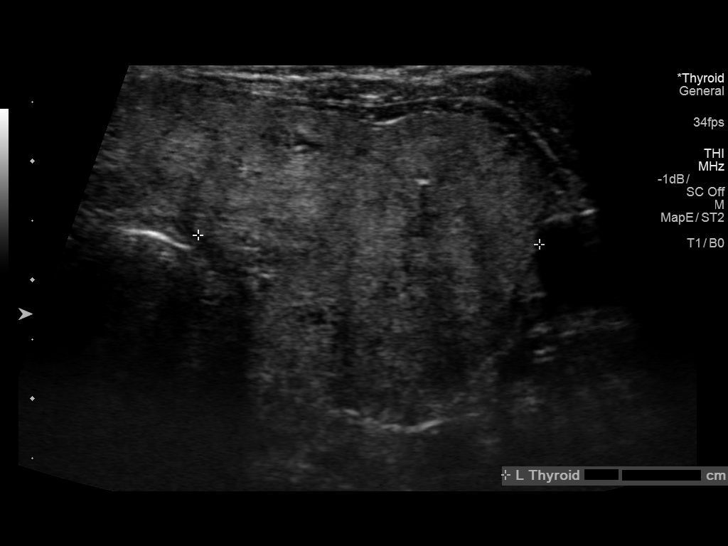
[im 41/62]
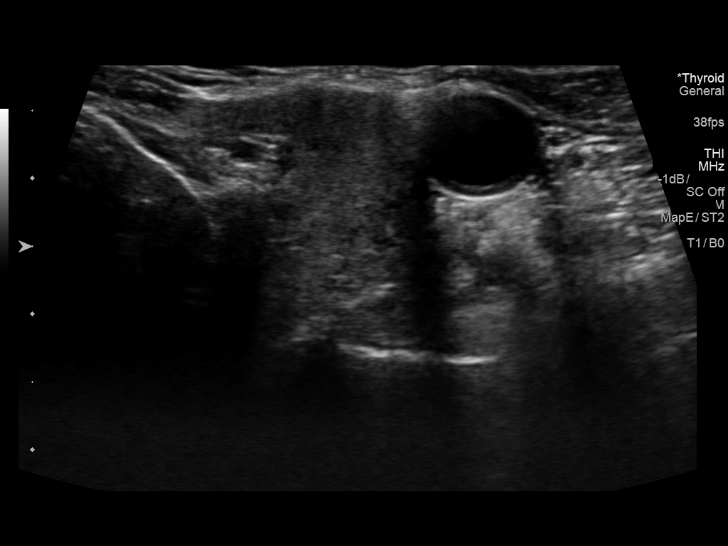
[im 46/62]
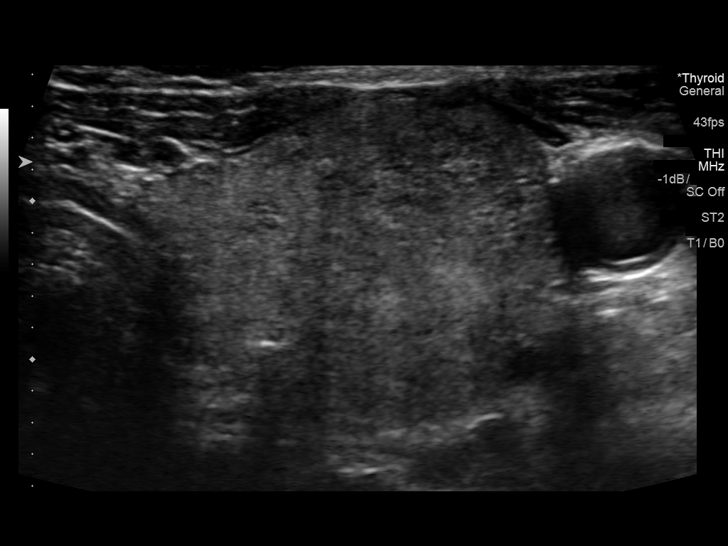
[im 51/62]
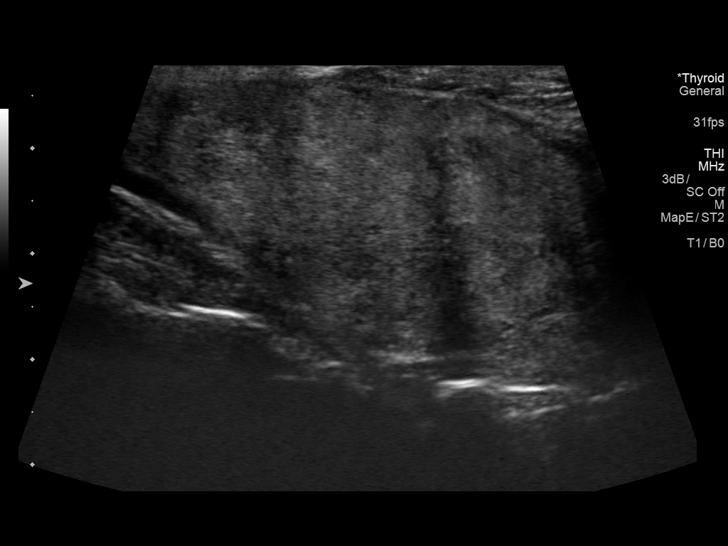
[im 56/62]
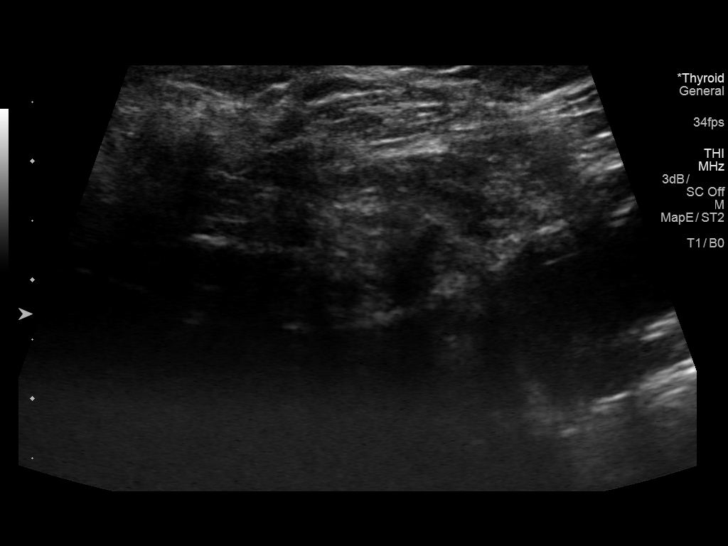
[im 62/62]
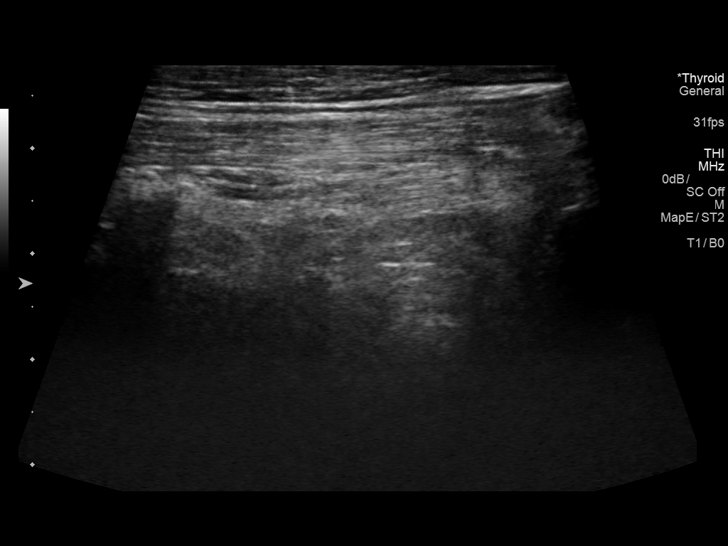

[13 of 25 positions shown; findings below may reference images not displayed]

FINDINGS: Parenchymal Echotexture: Moderately heterogenous

Isthmus: 0.8 cm, previously 1.0 cm

Right lobe: 6.5 x 2.9 x 2.2 cm, previously 7.3 x 2.6 x 2.5 cm

Left lobe: 7.0 x 3.3 x 2.9 cm, previously 7.5 x 3.3 x 2.7 cm

_________________________________________________________

Estimated total number of nodules >/= 1 cm: 1

Number of spongiform nodules >/=  2 cm not described below (TR1): 0

Number of mixed cystic and solid nodules >/= 1.5 cm not described
below (TR2): 0

Nodule # 1:

Location: Right; Superior

Maximum size: 0.7 cm; Other 2 dimensions: 0.7 x 0.6 cm

Composition: solid/almost completely solid (2)

Echogenicity: isoechoic (1)

Shape: not taller-than-wide (0)

Margins: smooth (0)

Echogenic foci: none (0)

ACR TI-RADS total points: 3.

ACR TI-RADS risk category: TR3 (3 points).

ACR TI-RADS recommendations:

Given size (<1.4 cm) and appearance, this nodule does NOT meet
TI-RADS criteria for biopsy or dedicated follow-up.

_________________________________________________________

Nodule # 2:

Location: Left; Mid

Maximum size: 1.0 cm; Other 2 dimensions: 0.8 x 0.7 cm

Composition: solid/almost completely solid (2)

Echogenicity: hypoechoic (2)

Shape: not taller-than-wide (0)

Margins: smooth (0)

Echogenic foci: none (0)

ACR TI-RADS total points: 4.

ACR TI-RADS risk category: TR4 (4-6 points).

ACR TI-RADS recommendations:

*Given size (>/= 1 - 1.4 cm) and appearance, a follow-up ultrasound
in 1 year should be considered based on TI-RADS criteria.

_________________________________________________________

Bilateral calcifications without associated soft tissue nodule are
not significantly changed. They measure up to 0.6 cm in size.
IMPRESSION: Bilateral new nodules are identified. Previous new nodules noted
dating back to 3662 are not clearly visualized. Nodule 2 meets
criteria for annual follow-up.

The above is in keeping with the ACR TI-RADS recommendations - [HOSPITAL] 4127;[DATE].

## 2018-12-02 IMAGING — CR DG CHEST 2V
2 series · 2 of 2 positions shown · non-contrast
Comparison: Prior radiograph from 09/23/2014.

CLINICAL DATA: Initial evaluation for acute central chest pain.

EXAM:
CHEST  2 VIEW

[chest pa]
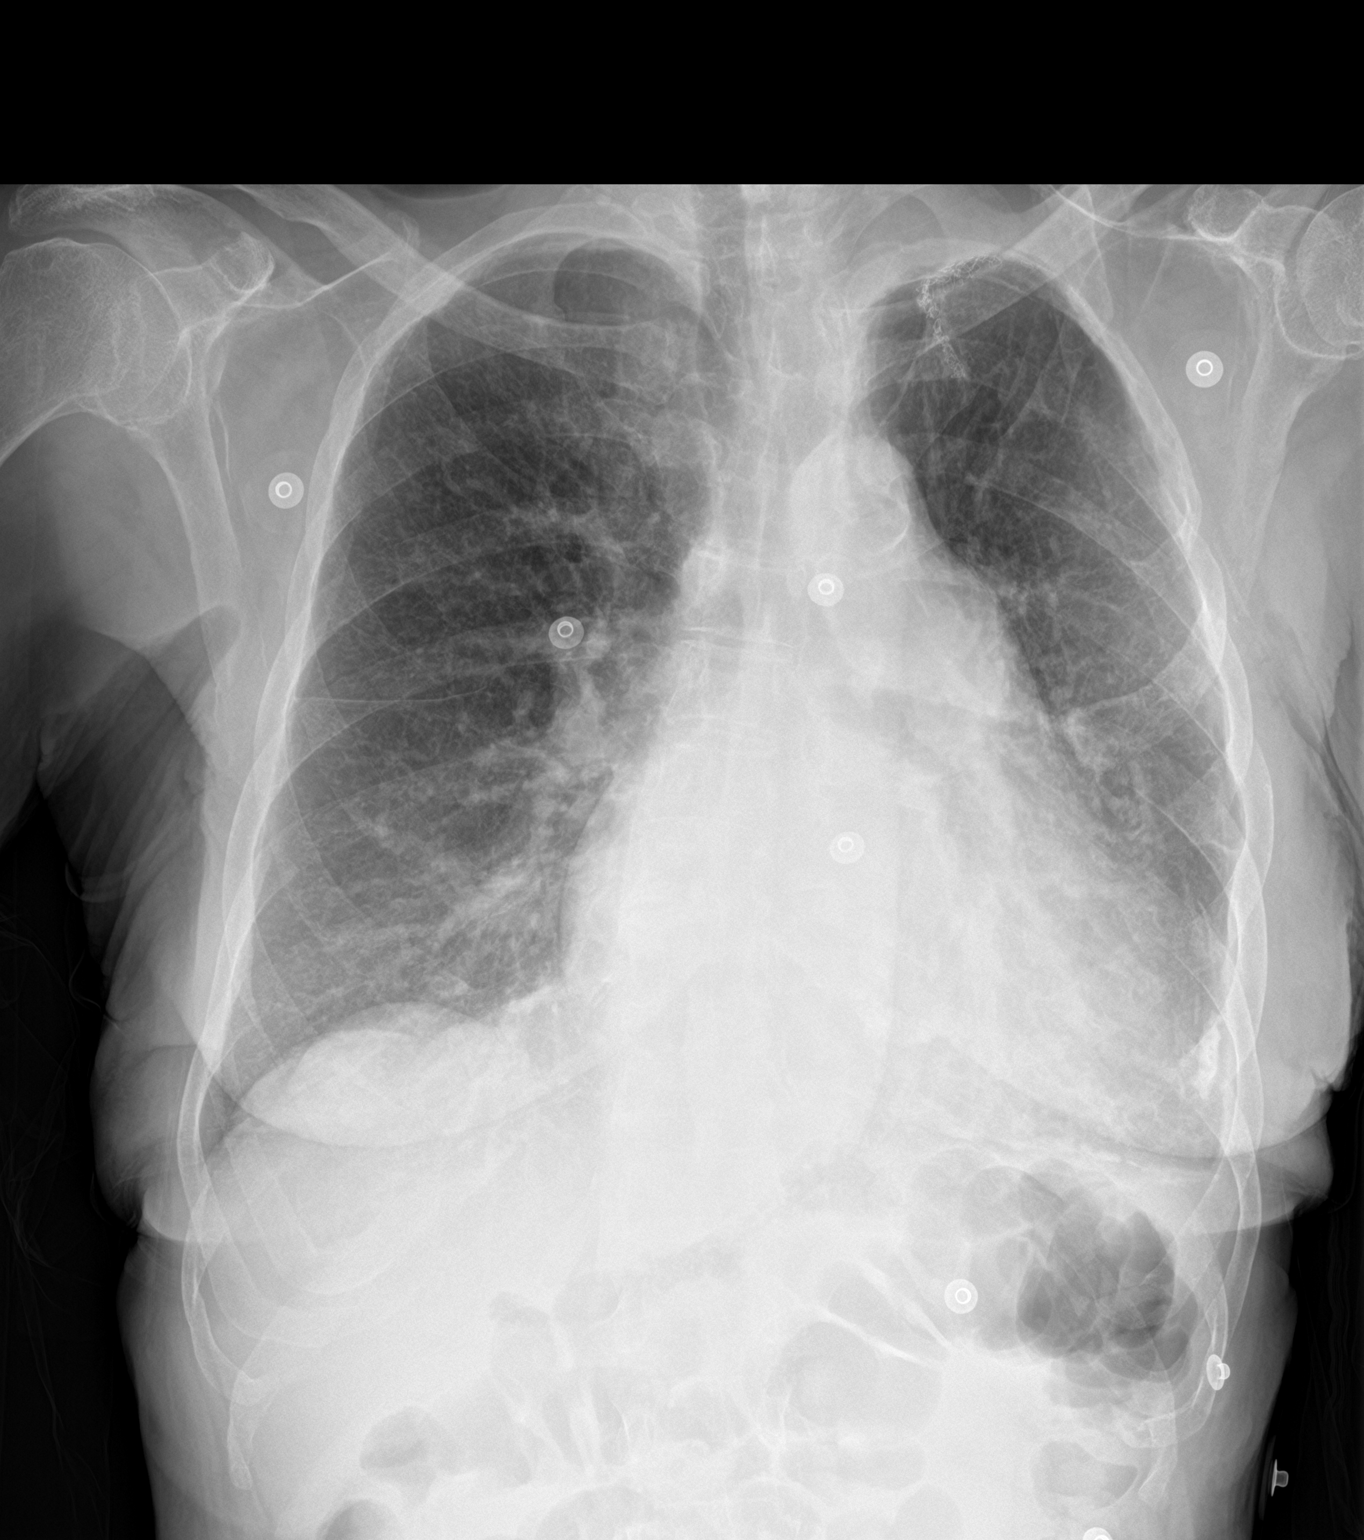

[chest lat]
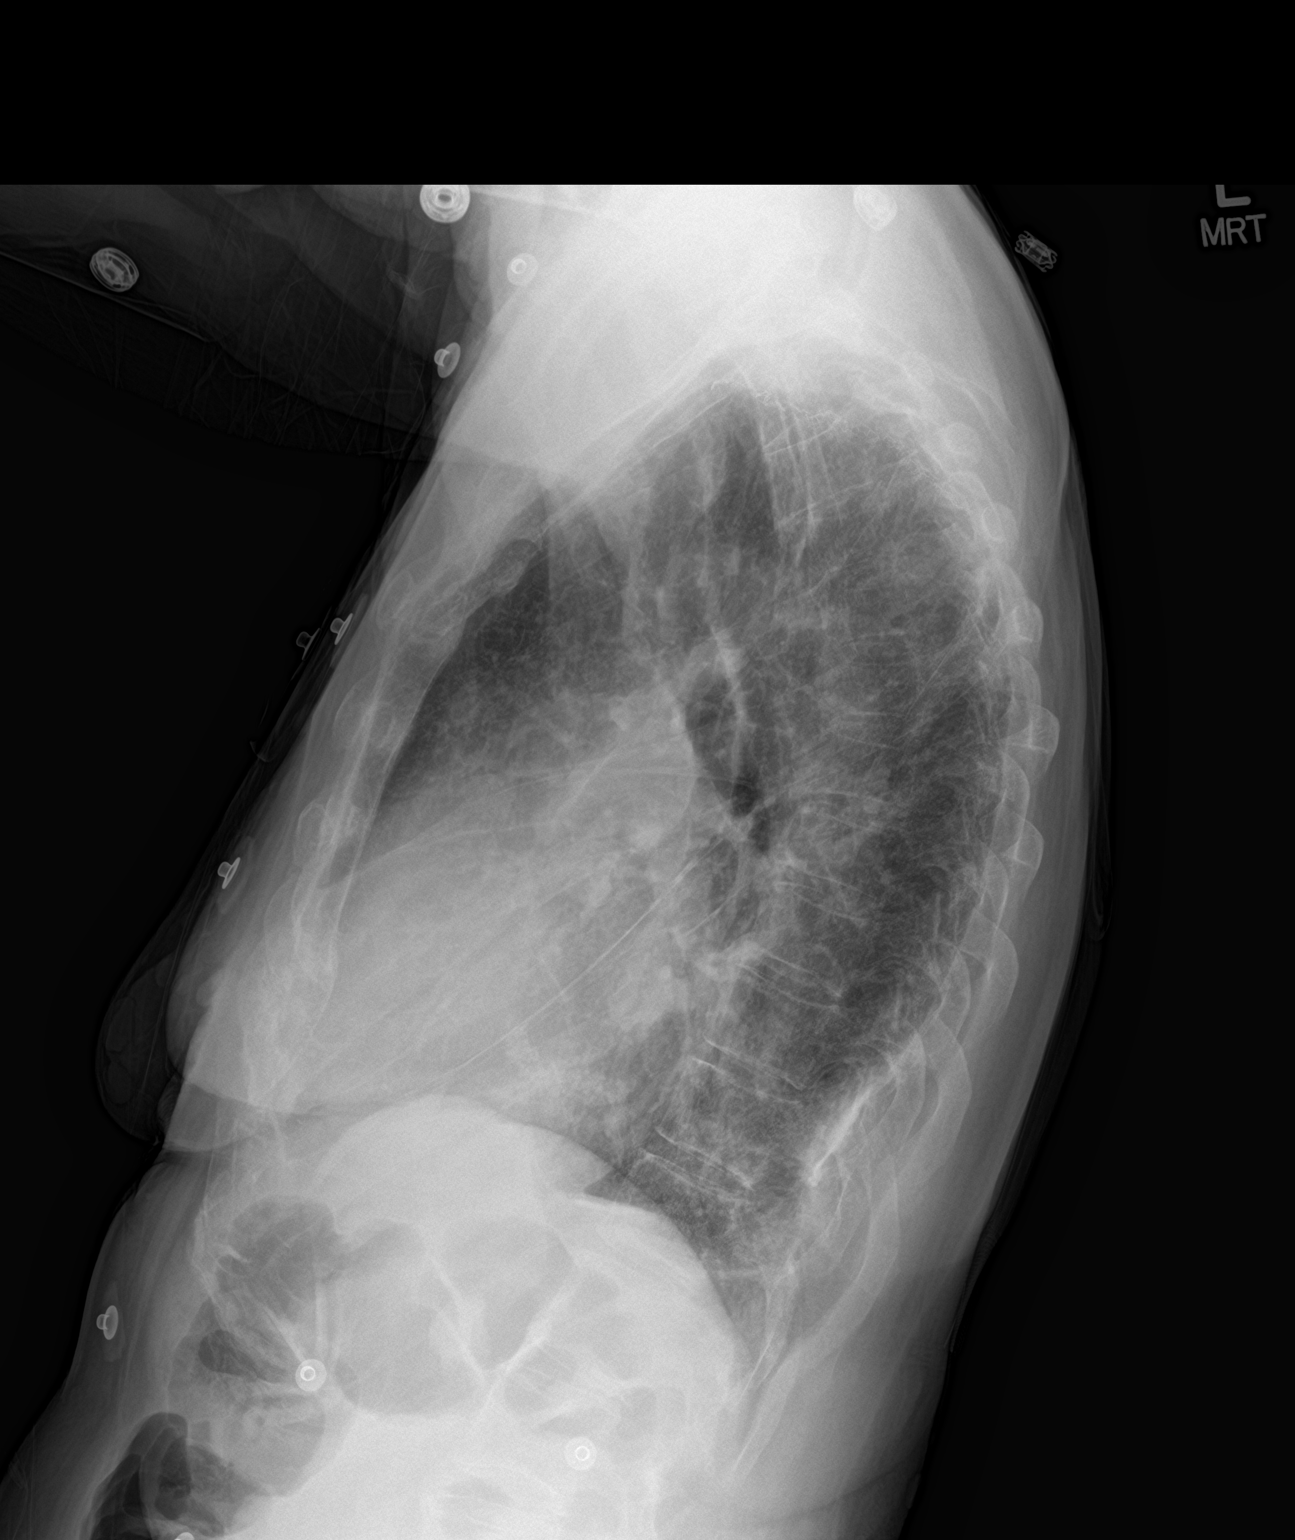

[2 of 2 positions shown; findings below may reference images not displayed]

FINDINGS: Moderate cardiomegaly. Mediastinal silhouette within normal limits.
Aortic atherosclerosis noted.

Lungs are normally inflated. Pulmonary vascular congestion with
interstitial prominence, suggesting pulmonary interstitial edema. No
focal infiltrates identified. Blunting of the left costophrenic
angle similar to prior, likely chronic. No definite pleural
effusion. No pneumothorax. Suture material at the left lung apex.

No acute osseous abnormality.
IMPRESSION: 1. Cardiomegaly with mild diffuse pulmonary interstitial edema.
2. Aortic atherosclerosis.
